# Patient Record
Sex: Female | Born: 1965 | Race: Black or African American | Hispanic: No | Marital: Single | Smoking: Never smoker
Health system: Southern US, Community
[De-identification: ages and names within clinical notes are randomized; demographics above are authoritative.]

## PROBLEM LIST (undated history)

## (undated) DIAGNOSIS — J349 Unspecified disorder of nose and nasal sinuses: Secondary | ICD-10-CM

## (undated) DIAGNOSIS — T7840XA Allergy, unspecified, initial encounter: Secondary | ICD-10-CM

## (undated) DIAGNOSIS — G4730 Sleep apnea, unspecified: Secondary | ICD-10-CM

## (undated) DIAGNOSIS — F419 Anxiety disorder, unspecified: Secondary | ICD-10-CM

## (undated) DIAGNOSIS — R0683 Snoring: Secondary | ICD-10-CM

## (undated) DIAGNOSIS — F329 Major depressive disorder, single episode, unspecified: Secondary | ICD-10-CM

## (undated) DIAGNOSIS — E7800 Pure hypercholesterolemia, unspecified: Secondary | ICD-10-CM

## (undated) DIAGNOSIS — R011 Cardiac murmur, unspecified: Secondary | ICD-10-CM

## (undated) DIAGNOSIS — R7303 Prediabetes: Secondary | ICD-10-CM

## (undated) DIAGNOSIS — U071 COVID-19: Secondary | ICD-10-CM

## (undated) DIAGNOSIS — Z973 Presence of spectacles and contact lenses: Secondary | ICD-10-CM

## (undated) DIAGNOSIS — K219 Gastro-esophageal reflux disease without esophagitis: Secondary | ICD-10-CM

## (undated) DIAGNOSIS — F32A Depression, unspecified: Secondary | ICD-10-CM

## (undated) DIAGNOSIS — J189 Pneumonia, unspecified organism: Secondary | ICD-10-CM

## (undated) HISTORY — PX: JOINT REPLACEMENT: SHX530

## (undated) HISTORY — PX: TUBAL LIGATION: SHX77

## (undated) HISTORY — DX: Cardiac murmur, unspecified: R01.1

## (undated) HISTORY — DX: Gastro-esophageal reflux disease without esophagitis: K21.9

## (undated) HISTORY — DX: Pure hypercholesterolemia, unspecified: E78.00

## (undated) HISTORY — DX: Sleep apnea, unspecified: G47.30

## (undated) HISTORY — DX: Allergy, unspecified, initial encounter: T78.40XA

## (undated) HISTORY — PX: CERVICAL FUSION: SHX112

---

## 1991-10-29 HISTORY — PX: CERVICAL FUSION: SHX112

## 1997-12-06 ENCOUNTER — Ambulatory Visit (HOSPITAL_COMMUNITY): Admission: RE | Admit: 1997-12-06 | Discharge: 1997-12-06 | Payer: Self-pay | Admitting: Family Medicine

## 2005-12-17 ENCOUNTER — Other Ambulatory Visit: Admission: RE | Admit: 2005-12-17 | Discharge: 2005-12-17 | Payer: Self-pay | Admitting: Family Medicine

## 2007-07-03 ENCOUNTER — Ambulatory Visit (HOSPITAL_BASED_OUTPATIENT_CLINIC_OR_DEPARTMENT_OTHER): Admission: RE | Admit: 2007-07-03 | Discharge: 2007-07-03 | Payer: Self-pay | Admitting: Orthopedic Surgery

## 2007-07-03 HISTORY — PX: CARPAL TUNNEL RELEASE: SHX101

## 2007-07-31 ENCOUNTER — Ambulatory Visit (HOSPITAL_BASED_OUTPATIENT_CLINIC_OR_DEPARTMENT_OTHER): Admission: RE | Admit: 2007-07-31 | Discharge: 2007-07-31 | Payer: Self-pay | Admitting: Orthopedic Surgery

## 2007-07-31 HISTORY — PX: CARPAL TUNNEL RELEASE: SHX101

## 2007-12-30 ENCOUNTER — Encounter: Admission: RE | Admit: 2007-12-30 | Discharge: 2007-12-30 | Payer: Self-pay | Admitting: Orthopedic Surgery

## 2010-01-31 ENCOUNTER — Encounter: Admission: RE | Admit: 2010-01-31 | Discharge: 2010-01-31 | Payer: Self-pay | Admitting: Family Medicine

## 2011-03-12 NOTE — Op Note (Signed)
NAMELATRICE, STORLIE            ACCOUNT NO.:  [id number]   MEDICAL RECORD NO.:  [id number]          PATIENT TYPE:  AMB   LOCATION:  DSC                          FACILITY:  [facility]   PHYSICIAN:  Katy Fitch. Sypher, M.D. DATE OF BIRTH:  Feb 21, 1966   DATE OF PROCEDURE:  07/03/2007  DATE OF DISCHARGE:                               OPERATIVE REPORT   PREOPERATIVE DIAGNOSIS:  Chronic entrapped neuropathy, left median nerve  at carpal tunnel.   POSTOPERATIVE DIAGNOSIS:  Chronic entrapped neuropathy, left median  nerve at carpal tunnel.   OPERATIONS:  Release of left transverse carpal ligament.   OPERATIONS:  Lovey Newcomer, M.D.   ASSISTANT:  Nurse.   ANESTHESIA:  General by LMA; supervising anesthesiologist is Dr. Gypsy Balsam.   INDICATIONS:  Grace Barajas is a 45 year old woman employed by  [employer].  She has had a history of bilateral hand numbness  and discomfort.  She is referred by her primary care physician, Dr.  Christell Constant, in [place], [place] for evaluation and management of hand  numbness and discomfort.   Clinical examination suggested bilateral carpal tunnel syndrome.  Electrodiagnostic studies completed by Dr. Johna Roles revealed evidence of  moderately severe bilateral carpal tunnel syndrome.   Due to failed response to nonoperative measures, she is brought to the  operating room at this time for release of her left transverse carpal  ligament.   PROCEDURE:  Noelia Slaby was brought to the operating room and  placed in supine position on the operating table.   Following the induction of general anesthesia by LMA technique, the left  arm was prepped with Betadine soap solution and sterilely draped.  A  pneumatic tourniquet was applied proximally on the left brachium.   Following exsanguination of the left arm with Esmarch bandage, arterial  tourniquet was inflated to 220 mmHg.  Procedure commenced with a short  incision in the line of the ring finger and  palm.  Subcutaneous tissues  were carefully divided, revealing the palmar fascia.  This was split  longitudinally to reveal the __________ branches of the median nerve.  These were followed back to transverse carpal ligament which was gently  dissected from the median nerve.  The distal margin of the transverse  carpal ligament was quite tight and had extensive muscle obscuring the  anatomy of the transverse carpal ligament.  The muscle fibers were  gently teased apart to safely identify the ligament and to assure that  no motor branches were present.   The ligament was then released along its ulnar border extending into the  distal forearm.  This widely opened the carpal canal.  No masses or  other predicaments were noted.   Bleeding points along the margin of released ligament were  electrocauterized with bipolar current followed by repair of the skin  with intradermal 3-0 Prolene suture.   A compressive dressing was applied with a volar plasty splint,  maintaining this in 5 degrees of dorsiflexion.   For aftercare, Ms. Mangrum is provided a prescription of Percocet 5 mg  1 p.o. q.4-6h. p.r.n. pain 20 tablets without refill.  She will return  to see [place] in the office for follow-up in a week to consider suture  removal.  Will also initiate an exercise program at that time.  We  anticipate right carpal tunnel surgery in approximately 3 weeks.      Katy Fitch Sypher, M.D.  Electronically Signed     RVS/MEDQ  D:  07/03/2007  T:  07/03/2007  Job:  [id number]   cc:   Ernestina Penna, M.D.  Katy Fitch Sypher, M.D.

## 2011-03-12 NOTE — Op Note (Signed)
[name], [name]            ACCOUNT NO.:  [id number]   MEDICAL RECORD NO.:  [id number]          PATIENT TYPE:  AMB   LOCATION:  DSC                          FACILITY:  [facility]   PHYSICIAN:  Katy Fitch. Sypher, M.D. DATE OF BIRTH:  Dec 02, 1965   DATE OF PROCEDURE:  07/31/2007  DATE OF DISCHARGE:                               OPERATIVE REPORT   PREOPERATIVE DIAGNOSIS:  Chronic entrapment neuropathy, right median  nerve, at wrist.   POSTOPERATIVE DIAGNOSIS:  Chronic entrapment neuropathy, right median  nerve, at wrist.   OPERATION:  Release of right transverse carpal ligament.   OPERATING SURGEON:  Josephine Igo, MD   ASSISTANT:  Annye Rusk PA-C   ANESTHESIA:  General by LMA.   SUPERVISING ANESTHESIOLOGIST:  Jairo Ben, MD   INDICATIONS:  Grace Barajas is a 45 year old woman referred for  evaluation and management of bilateral carpal tunnel syndrome.  She is  status post release of her left transverse carpal ligament with a  satisfactory result.  She now presents for identical surgery on the  right.   After informed consent, she was brought to the operating room at this  time, anticipating release of her right transverse carpal ligament.   PROCEDURE:  Grace Barajas was brought to the operating room and  placed in a supine position upon the operating table.   Following the induction of general anesthesia by LMA technique, the  right arm was prepped with Betadine soaping solution and sterilely  draped.   On exsanguination of the right arm with an Esmarch bandage, the arterial  tourniquet on the proximal brachium was inflated to 220 mmHg.  The  procedure commenced with a short incision in the line of the ring finger  and the palm.  Subcutaneous tissues were carefully divided, revealing  the palmar fascia.  The palmar fascia was split longitudinally to reveal  the common sensory branch of the median nerve and the superficial palmar  arch.  The distal margin  of the transverse carpal ligament was rather  indistinct with a prominent muscle crossing between the hypothenar and  thenar muscles.   This was gently teased apart with scissors, identifying the distal  margin of the transverse carpal ligament.   The ligament was released along its distal ulnar aspect with a scalpel  and scissors and subsequently released across the wrist on its ulnar  aspect into the distal forearm.  The volar forearm fascia was likewise  released subcutaneously.   This widely opened the carpal canal.  The median nerve was fully  decompressed.  The ulnar bursa was fibrotic and opaque.   There were no mass or predicaments appreciated.   Bleeding points along the margin of the released ligament were  electrocauterized with bipolar current followed by repair of the skin  with intradermal 3-0 Prolene suture.   A compressive supply was applied with a volar plaster splint,  maintaining the wrist in 5 degrees of dorsiflexion.   For aftercare, Grace Barajas was given a prescription for Percocet 5 mg  one p.o. q.4-6 h. p.r.n. pain, 20 tablets without refill.   She will return for followup  in 1 week or sooner p.r.n. problems.      Katy Fitch Sypher, M.D.  Electronically Signed     RVS/MEDQ  D:  07/31/2007  T:  08/01/2007  Job:  [id number]

## 2011-08-08 DIAGNOSIS — F251 Schizoaffective disorder, depressive type: Secondary | ICD-10-CM | POA: Insufficient documentation

## 2011-08-08 DIAGNOSIS — F259 Schizoaffective disorder, unspecified: Secondary | ICD-10-CM | POA: Insufficient documentation

## 2011-08-08 LAB — POCT HEMOGLOBIN-HEMACUE
Hemoglobin: 11.2 — ABNORMAL LOW
Operator id: 123881

## 2011-08-09 LAB — POCT HEMOGLOBIN-HEMACUE: Operator id: 123881

## 2011-12-30 ENCOUNTER — Encounter: Payer: Self-pay | Admitting: Pulmonary Disease

## 2011-12-30 ENCOUNTER — Ambulatory Visit (INDEPENDENT_AMBULATORY_CARE_PROVIDER_SITE_OTHER): Payer: Self-pay | Admitting: Pulmonary Disease

## 2011-12-30 ENCOUNTER — Other Ambulatory Visit (INDEPENDENT_AMBULATORY_CARE_PROVIDER_SITE_OTHER): Payer: Medicare Other

## 2011-12-30 VITALS — BP 120/72 | HR 94 | Temp 98.8°F | Ht 65.5 in | Wt 204.0 lb

## 2011-12-30 DIAGNOSIS — R0609 Other forms of dyspnea: Secondary | ICD-10-CM

## 2011-12-30 DIAGNOSIS — R0600 Dyspnea, unspecified: Secondary | ICD-10-CM

## 2011-12-30 DIAGNOSIS — R0989 Other specified symptoms and signs involving the circulatory and respiratory systems: Secondary | ICD-10-CM

## 2011-12-30 LAB — BASIC METABOLIC PANEL
CO2: 27 mEq/L (ref 19–32)
Chloride: 109 mEq/L (ref 96–112)
GFR: 119.6 mL/min (ref >60.00)
Glucose, Bld: 102 mg/dL — ABNORMAL HIGH (ref 70–99)
Sodium: 142 mEq/L (ref 135–145)

## 2011-12-30 NOTE — Patient Instructions (Signed)
Your shortness of breath may be related to panic attacks We will check blood work today - if positive, you may need a scan for your lungs to rule out blood clots If negative, please discuss with your psychiatrist further No breathing meds required

## 2011-12-30 NOTE — Progress Notes (Signed)
  Subjective:    Patient ID: Grace Barajas, female    DOB: 1966/07/17, 46 y.o.   MRN: [id number]  HPI PCP - Aquilla Hacker FP  46 year old never smoker for evaluation of episodic dyspnea. She reports ongoing dyspnea unrelated to exertion, while laying down or while walking for the past one to 2 months. She reports 2 severe episodes-the first one was when she was working in her rental apartment and may have been exposed to a chemical. Seen initially- felt to be RADS due to chemical reaction- given albuterol & prednisone. Seen again on 11/18/11 - prolonged exp phase on exam Spirometry did not show any evidence of airway obstruction. FEV1 was 86%-2.1 L, ratio is 80. CXR was nml Labs reviewed were normal, except for a borderline white count of 3.9 She has been treated for depression and anxiety at [facility] for 5 years and is maintained on bupropion and perphenazine. She has gained wt to 204 lbs now She wonders if her symptoms may be related to panic attacks but is concerned about blood clots and wants to be checked out. A nephew had a DVT after an ankle fracture. She denies tremors, palpitations, chest pain, sense of impending doom during these attacks. She denies nocturnal wheezing or childhood history of asthma.   Review of Systems  Constitutional: Positive for unexpected weight change. Negative for fever.  HENT: Positive for dental problem. Negative for ear pain, nosebleeds, congestion, sore throat, rhinorrhea, sneezing, trouble swallowing, postnasal drip and sinus pressure.   Eyes: Negative for redness and itching.  Respiratory: Positive for shortness of breath. Negative for cough, chest tightness and wheezing.   Cardiovascular: Negative for palpitations and leg swelling.  Gastrointestinal: Negative for nausea and vomiting.  Genitourinary: Negative for dysuria.  Musculoskeletal: Positive for arthralgias. Negative for joint swelling.  Skin: Negative for rash.    Neurological: Negative for headaches.  Hematological: Does not bruise/bleed easily.  Psychiatric/Behavioral: Negative for dysphoric mood. The patient is nervous/anxious.        Objective:   Physical Exam  Gen. Pleasant, well-nourished, in no distress, normal affect ENT - no lesions, no post nasal drip Neck: No JVD, no thyromegaly, no carotid bruits Lungs: no use of accessory muscles, no dullness to percussion, clear without rales or rhonchi  Cardiovascular: Rhythm regular, heart sounds  normal, no murmurs or gallops, no peripheral edema Abdomen: soft and non-tender, no hepatosplenomegaly, BS normal. Musculoskeletal: No deformities, no cyanosis or clubbing Neuro:  alert, non focal        Assessment & Plan:

## 2011-12-30 NOTE — Assessment & Plan Note (Addendum)
Spirometry does not show airway obstruction Doubt late onset asthma here, ok to stop symbicort may be related to panic attacks We will check BMET, TSH & d-dimer - if positive, you may need a scan for your lungs to rule out blood clots If negative, please discuss with your psychiatrist further No breathing meds required If Further confirmation required, methacholine challenge testing can be performed in the future

## 2011-12-31 LAB — D-DIMER, QUANTITATIVE: D-Dimer, Quant: 0.22 ug/mL-FEU (ref 0.00–0.48)

## 2013-01-05 ENCOUNTER — Encounter (HOSPITAL_COMMUNITY): Payer: Self-pay | Admitting: Pharmacist

## 2013-01-11 ENCOUNTER — Other Ambulatory Visit: Payer: Self-pay | Admitting: Obstetrics and Gynecology

## 2013-01-14 ENCOUNTER — Inpatient Hospital Stay (HOSPITAL_COMMUNITY): Admission: RE | Admit: 2013-01-14 | Payer: Medicare Other | Source: Ambulatory Visit

## 2013-01-18 ENCOUNTER — Encounter (HOSPITAL_COMMUNITY)
Admission: RE | Admit: 2013-01-18 | Discharge: 2013-01-18 | Disposition: A | Discharge status: Home / Self Care | Payer: Medicare Other | Source: Ambulatory Visit | Attending: Obstetrics and Gynecology | Admitting: Obstetrics and Gynecology

## 2013-01-18 ENCOUNTER — Encounter (HOSPITAL_COMMUNITY): Payer: Self-pay

## 2013-01-18 HISTORY — DX: Major depressive disorder, single episode, unspecified: F32.9

## 2013-01-18 HISTORY — DX: Depression, unspecified: F32.A

## 2013-01-18 HISTORY — DX: Unspecified disorder of nose and nasal sinuses: J34.9

## 2013-01-18 HISTORY — DX: Anxiety disorder, unspecified: F41.9

## 2013-01-18 LAB — BASIC METABOLIC PANEL
BUN: 13 mg/dL (ref 6–23)
Calcium: 10 mg/dL (ref 8.4–10.5)
GFR calc Af Amer: >90 mL/min (ref >90)
GFR calc non Af Amer: >90 mL/min (ref >90)
Sodium: 140 mEq/L (ref 135–145)

## 2013-01-18 LAB — CBC
MCHC: 34.1 g/dL (ref 30.0–36.0)
MCV: 87.1 fL (ref 78.0–100.0)
Platelets: 418 10*3/uL — ABNORMAL HIGH (ref 150–400)
RBC: 3.94 MIL/uL (ref 3.87–5.11)
WBC: 5.7 10*3/uL (ref 4.0–10.5)

## 2013-01-18 LAB — SURGICAL PCR SCREEN: Staphylococcus aureus: NEGATIVE

## 2013-01-18 NOTE — Patient Instructions (Addendum)
[address]  01/18/2013   Your procedure is scheduled on:  01/21/13  Enter through the Main Entrance of [facility] at 1130 AM.  Pick up the phone at the desk and dial [phone].   Call this number if you have problems the morning of surgery: [phone]   Remember:   Do not eat food:After Midnight.  Do not drink clear liquids: 6 Hours before arrival.  Take these medicines the morning of surgery with A SIP OF WATER: Morning medication   Do not wear jewelry, make-up or nail polish.  Do not wear lotions, powders, or perfumes. You may wear deodorant.  Do not shave 48 hours prior to surgery.  Do not bring valuables to the hospital.  Contacts, dentures or bridgework may not be worn into surgery.  Leave suitcase in the car. After surgery it may be brought to your room.  For patients admitted to the hospital, checkout time is 11:00 AM the day of discharge.   Patients discharged the day of surgery will not be allowed to drive home.  Name and phone number of your driver: NA  Special Instructions: Shower using CHG 2 nights before surgery and the night before surgery.  If you shower the day of surgery use CHG.  Use special wash - you have one bottle of CHG for all showers.  You should use approximately 1/3 of the bottle for each shower.   Please read over the following fact sheets that you were given: MRSA Information

## 2013-01-21 ENCOUNTER — Encounter (HOSPITAL_COMMUNITY)
Admission: RE | Disposition: A | Discharge status: Home / Self Care | Payer: Self-pay | Source: Ambulatory Visit | Attending: Obstetrics and Gynecology

## 2013-01-21 ENCOUNTER — Encounter (HOSPITAL_COMMUNITY): Payer: Self-pay

## 2013-01-21 ENCOUNTER — Ambulatory Visit (HOSPITAL_COMMUNITY): Payer: Medicare Other | Admitting: Anesthesiology

## 2013-01-21 ENCOUNTER — Ambulatory Visit (HOSPITAL_COMMUNITY)
Admission: RE | Admit: 2013-01-21 | Discharge: 2013-01-22 | Disposition: A | Discharge status: Home / Self Care | Payer: Medicare Other | Source: Ambulatory Visit | Attending: Obstetrics and Gynecology | Admitting: Obstetrics and Gynecology

## 2013-01-21 ENCOUNTER — Encounter (HOSPITAL_COMMUNITY): Payer: Self-pay | Admitting: Anesthesiology

## 2013-01-21 DIAGNOSIS — Z90710 Acquired absence of both cervix and uterus: Secondary | ICD-10-CM

## 2013-01-21 DIAGNOSIS — D259 Leiomyoma of uterus, unspecified: Secondary | ICD-10-CM | POA: Insufficient documentation

## 2013-01-21 DIAGNOSIS — N920 Excessive and frequent menstruation with regular cycle: Secondary | ICD-10-CM | POA: Insufficient documentation

## 2013-01-21 HISTORY — PX: ROBOTIC ASSISTED TOTAL HYSTERECTOMY: SHX6085

## 2013-01-21 HISTORY — PX: BILATERAL SALPINGECTOMY: SHX5743

## 2013-01-21 LAB — BASIC METABOLIC PANEL
BUN: 12 mg/dL (ref 6–23)
CO2: 25 mEq/L (ref 19–32)
Calcium: 9.2 mg/dL (ref 8.4–10.5)
Chloride: 97 mEq/L (ref 96–112)
Creatinine, Ser: 0.67 mg/dL (ref 0.50–1.10)
Glucose, Bld: 162 mg/dL — ABNORMAL HIGH (ref 70–99)

## 2013-01-21 LAB — CBC
HCT: 31.9 % — ABNORMAL LOW (ref 36.0–46.0)
MCH: 29.7 pg (ref 26.0–34.0)
MCHC: 34.5 g/dL (ref 30.0–36.0)
MCV: 86.2 fL (ref 78.0–100.0)
Platelets: 352 10*3/uL (ref 150–400)
RDW: 13.3 % (ref 11.5–15.5)

## 2013-01-21 SURGERY — ROBOTIC ASSISTED TOTAL HYSTERECTOMY
Anesthesia: General | Wound class: Clean Contaminated

## 2013-01-21 MED ORDER — GLYCOPYRROLATE 0.2 MG/ML IJ SOLN
INTRAMUSCULAR | Status: AC
  Filled 2013-01-21: qty 1

## 2013-01-21 MED ORDER — FENTANYL CITRATE 0.05 MG/ML IJ SOLN
INTRAMUSCULAR | Status: AC
  Administered 2013-01-21: 50 ug via INTRAVENOUS
  Filled 2013-01-21: qty 2

## 2013-01-21 MED ORDER — ONDANSETRON HCL 4 MG PO TABS: 4.0000 mg | ORAL_TABLET | Freq: Four times a day (QID) | ORAL | Status: DC | PRN

## 2013-01-21 MED ORDER — NEOSTIGMINE METHYLSULFATE 1 MG/ML IJ SOLN
INTRAMUSCULAR | Status: AC
  Filled 2013-01-21: qty 1

## 2013-01-21 MED ORDER — ACETAMINOPHEN 10 MG/ML IV SOLN
1000.0000 mg | Freq: Four times a day (QID) | INTRAVENOUS | Status: AC
  Administered 2013-01-21: 1000 mg via INTRAVENOUS
  Filled 2013-01-21: qty 100

## 2013-01-21 MED ORDER — KETOROLAC TROMETHAMINE 30 MG/ML IJ SOLN: 15.0000 mg | Freq: Once | INTRAMUSCULAR | Status: AC | PRN

## 2013-01-21 MED ORDER — ONDANSETRON HCL 4 MG/2ML IJ SOLN
4.0000 mg | Freq: Four times a day (QID) | INTRAMUSCULAR | Status: DC | PRN
  Administered 2013-01-21: 4 mg via INTRAVENOUS
  Filled 2013-01-21: qty 2

## 2013-01-21 MED ORDER — PROMETHAZINE HCL 25 MG/ML IJ SOLN: 6.2500 mg | INTRAMUSCULAR | Status: DC | PRN

## 2013-01-21 MED ORDER — KETOROLAC TROMETHAMINE 30 MG/ML IJ SOLN
INTRAMUSCULAR | Status: AC
  Administered 2013-01-21: 30 mg via INTRAVENOUS
  Filled 2013-01-21: qty 1

## 2013-01-21 MED ORDER — HYDROMORPHONE HCL PF 1 MG/ML IJ SOLN
INTRAMUSCULAR | Status: DC | PRN
  Administered 2013-01-21: 1 mg via INTRAVENOUS

## 2013-01-21 MED ORDER — KETOROLAC TROMETHAMINE 30 MG/ML IJ SOLN
30.0000 mg | Freq: Four times a day (QID) | INTRAMUSCULAR | Status: DC
  Administered 2013-01-21: 30 mg via INTRAVENOUS
  Filled 2013-01-21: qty 1

## 2013-01-21 MED ORDER — ONDANSETRON HCL 4 MG/2ML IJ SOLN
INTRAMUSCULAR | Status: DC | PRN
  Administered 2013-01-21: 4 mg via INTRAVENOUS

## 2013-01-21 MED ORDER — OXYCODONE-ACETAMINOPHEN 5-325 MG PO TABS
1.0000 | ORAL_TABLET | ORAL | Status: DC | PRN
  Administered 2013-01-21: 1 via ORAL
  Filled 2013-01-21: qty 1

## 2013-01-21 MED ORDER — FENTANYL CITRATE 0.05 MG/ML IJ SOLN
25.0000 ug | INTRAMUSCULAR | Status: DC | PRN
  Administered 2013-01-21 (×2): 50 ug via INTRAVENOUS

## 2013-01-21 MED ORDER — PROPOFOL 10 MG/ML IV EMUL
INTRAVENOUS | Status: AC
  Filled 2013-01-21: qty 20

## 2013-01-21 MED ORDER — PHENYLEPHRINE HCL 10 MG/ML IJ SOLN
INTRAMUSCULAR | Status: DC | PRN
  Administered 2013-01-21 (×2): .04 mg via INTRAVENOUS

## 2013-01-21 MED ORDER — PANTOPRAZOLE SODIUM 40 MG PO TBEC
40.0000 mg | DELAYED_RELEASE_TABLET | Freq: Every day | ORAL | Status: DC
  Filled 2013-01-21 (×2): qty 1

## 2013-01-21 MED ORDER — OXYCODONE-ACETAMINOPHEN 5-325 MG PO TABS: 1.0000 | ORAL_TABLET | ORAL | Status: DC | PRN

## 2013-01-21 MED ORDER — GLYCOPYRROLATE 0.2 MG/ML IJ SOLN
INTRAMUSCULAR | Status: AC
  Filled 2013-01-21: qty 2

## 2013-01-21 MED ORDER — MEPERIDINE HCL 25 MG/ML IJ SOLN: 6.2500 mg | INTRAMUSCULAR | Status: DC | PRN

## 2013-01-21 MED ORDER — ROCURONIUM BROMIDE 50 MG/5ML IV SOLN
INTRAVENOUS | Status: AC
  Filled 2013-01-21: qty 1

## 2013-01-21 MED ORDER — PHENYLEPHRINE 40 MCG/ML (10ML) SYRINGE FOR IV PUSH (FOR BLOOD PRESSURE SUPPORT)
PREFILLED_SYRINGE | INTRAVENOUS | Status: AC
  Filled 2013-01-21: qty 5

## 2013-01-21 MED ORDER — ONDANSETRON HCL 4 MG/2ML IJ SOLN
INTRAMUSCULAR | Status: AC
Start: 2013-01-21 — End: 2013-01-21
  Filled 2013-01-21: qty 2

## 2013-01-21 MED ORDER — ROCURONIUM BROMIDE 100 MG/10ML IV SOLN
INTRAVENOUS | Status: DC | PRN
  Administered 2013-01-21 (×2): 10 mg via INTRAVENOUS
  Administered 2013-01-21: 20 mg via INTRAVENOUS
  Administered 2013-01-21: 40 mg via INTRAVENOUS
  Administered 2013-01-21: 10 mg via INTRAVENOUS
  Administered 2013-01-21: 20 mg via INTRAVENOUS

## 2013-01-21 MED ORDER — KETOROLAC TROMETHAMINE 30 MG/ML IJ SOLN
INTRAMUSCULAR | Status: DC | PRN
  Administered 2013-01-21: 30 mg via INTRAVENOUS

## 2013-01-21 MED ORDER — HYDROMORPHONE HCL PF 1 MG/ML IJ SOLN: 0.2500 mg | INTRAMUSCULAR | Status: DC | PRN

## 2013-01-21 MED ORDER — FENTANYL CITRATE 0.05 MG/ML IJ SOLN
INTRAMUSCULAR | Status: DC | PRN
  Administered 2013-01-21: 100 ug via INTRAVENOUS
  Administered 2013-01-21: 150 ug via INTRAVENOUS

## 2013-01-21 MED ORDER — LIDOCAINE HCL (CARDIAC) 20 MG/ML IV SOLN
INTRAVENOUS | Status: DC | PRN
  Administered 2013-01-21: 70 mg via INTRAVENOUS

## 2013-01-21 MED ORDER — HYDROMORPHONE HCL PF 1 MG/ML IJ SOLN
INTRAMUSCULAR | Status: AC
  Filled 2013-01-21: qty 1

## 2013-01-21 MED ORDER — DEXAMETHASONE SODIUM PHOSPHATE 10 MG/ML IJ SOLN
INTRAMUSCULAR | Status: AC
  Filled 2013-01-21: qty 1

## 2013-01-21 MED ORDER — LIDOCAINE HCL (CARDIAC) 20 MG/ML IV SOLN
INTRAVENOUS | Status: AC
  Filled 2013-01-21: qty 5

## 2013-01-21 MED ORDER — FLUOXETINE HCL 10 MG PO CAPS
10.0000 mg | ORAL_CAPSULE | Freq: Two times a day (BID) | ORAL | Status: DC
  Filled 2013-01-21 (×2): qty 1

## 2013-01-21 MED ORDER — PROPOFOL 10 MG/ML IV EMUL
INTRAVENOUS | Status: DC | PRN
  Administered 2013-01-21 (×3): 50 mg via INTRAVENOUS
  Administered 2013-01-21: 200 mg via INTRAVENOUS

## 2013-01-21 MED ORDER — PERPHENAZINE 2 MG PO TABS
2.0000 mg | ORAL_TABLET | Freq: Every day | ORAL | Status: DC
  Filled 2013-01-21: qty 1

## 2013-01-21 MED ORDER — NEOSTIGMINE METHYLSULFATE 1 MG/ML IJ SOLN
INTRAMUSCULAR | Status: DC | PRN
  Administered 2013-01-21: 4 mg via INTRAVENOUS

## 2013-01-21 MED ORDER — DEXAMETHASONE SODIUM PHOSPHATE 4 MG/ML IJ SOLN
INTRAMUSCULAR | Status: DC | PRN
  Administered 2013-01-21: 10 mg via INTRAVENOUS

## 2013-01-21 MED ORDER — MIDAZOLAM HCL 2 MG/2ML IJ SOLN: 0.5000 mg | Freq: Once | INTRAMUSCULAR | Status: DC | PRN

## 2013-01-21 MED ORDER — DEXTROSE IN LACTATED RINGERS 5 % IV SOLN
INTRAVENOUS | Status: DC
  Administered 2013-01-21: 23:00:00 via INTRAVENOUS

## 2013-01-21 MED ORDER — ZOLPIDEM TARTRATE 5 MG PO TABS: 5.0000 mg | ORAL_TABLET | Freq: Every evening | ORAL | Status: DC | PRN

## 2013-01-21 MED ORDER — MENTHOL 3 MG MT LOZG: 1.0000 | LOZENGE | OROMUCOSAL | Status: DC | PRN

## 2013-01-21 MED ORDER — MIDAZOLAM HCL 2 MG/2ML IJ SOLN
INTRAMUSCULAR | Status: AC
  Filled 2013-01-21: qty 2

## 2013-01-21 MED ORDER — FENTANYL CITRATE 0.05 MG/ML IJ SOLN
INTRAMUSCULAR | Status: AC
  Filled 2013-01-21: qty 5

## 2013-01-21 MED ORDER — IBUPROFEN 800 MG PO TABS: 800.0000 mg | ORAL_TABLET | Freq: Three times a day (TID) | ORAL | Status: DC | PRN

## 2013-01-21 MED ORDER — BUSPIRONE HCL 15 MG PO TABS
15.0000 mg | ORAL_TABLET | Freq: Two times a day (BID) | ORAL | Status: DC
  Filled 2013-01-21 (×2): qty 1

## 2013-01-21 MED ORDER — KETOROLAC TROMETHAMINE 30 MG/ML IJ SOLN: 30.0000 mg | Freq: Four times a day (QID) | INTRAMUSCULAR | Status: DC

## 2013-01-21 MED ORDER — MIDAZOLAM HCL 5 MG/5ML IJ SOLN
INTRAMUSCULAR | Status: DC | PRN
  Administered 2013-01-21: 2 mg via INTRAVENOUS

## 2013-01-21 MED ORDER — LACTATED RINGERS IV SOLN
INTRAVENOUS | Status: DC
  Administered 2013-01-21 (×3): via INTRAVENOUS

## 2013-01-21 MED ORDER — BUPIVACAINE HCL (PF) 0.25 % IJ SOLN
INTRAMUSCULAR | Status: AC
  Filled 2013-01-21: qty 30

## 2013-01-21 MED ORDER — CEFAZOLIN SODIUM-DEXTROSE 2-3 GM-% IV SOLR
INTRAVENOUS | Status: AC
  Filled 2013-01-21: qty 50

## 2013-01-21 MED ORDER — ACETAMINOPHEN 10 MG/ML IV SOLN
INTRAVENOUS | Status: AC
  Administered 2013-01-21: 1000 mg via INTRAVENOUS
  Filled 2013-01-21: qty 100

## 2013-01-21 MED ORDER — STERILE WATER FOR IRRIGATION IR SOLN
Status: DC | PRN
  Administered 2013-01-21: 1000 mL via INTRAVESICAL

## 2013-01-21 MED ORDER — GLYCOPYRROLATE 0.2 MG/ML IJ SOLN
INTRAMUSCULAR | Status: DC | PRN
  Administered 2013-01-21: .6 mg via INTRAVENOUS

## 2013-01-21 MED ORDER — CEFAZOLIN SODIUM-DEXTROSE 2-3 GM-% IV SOLR
2.0000 g | INTRAVENOUS | Status: AC
  Administered 2013-01-21: 2 g via INTRAVENOUS

## 2013-01-21 MED ORDER — LACTATED RINGERS IR SOLN
Status: DC | PRN
  Administered 2013-01-21: 3000 mL

## 2013-01-21 MED ORDER — HYDROMORPHONE HCL PF 1 MG/ML IJ SOLN
INTRAMUSCULAR | Status: AC
  Administered 2013-01-21: 0.5 mg via INTRAVENOUS
  Filled 2013-01-21: qty 1

## 2013-01-21 MED ORDER — KETOROLAC TROMETHAMINE 30 MG/ML IJ SOLN
INTRAMUSCULAR | Status: AC
  Filled 2013-01-21: qty 1

## 2013-01-21 SURGICAL SUPPLY — 65 items
BAG URINE DRAINAGE (UROLOGICAL SUPPLIES) ×3 IMPLANT
BARRIER ADHS 3X4 INTERCEED (GAUZE/BANDAGES/DRESSINGS) ×3 IMPLANT
CATH FOLEY 3WAY  5CC 16FR (CATHETERS) ×1
CATH FOLEY 3WAY 5CC 16FR (CATHETERS) ×2 IMPLANT
CHLORAPREP W/TINT 26ML (MISCELLANEOUS) ×3 IMPLANT
CLOTH BEACON ORANGE TIMEOUT ST (SAFETY) ×3 IMPLANT
CONT PATH 16OZ SNAP LID 3702 (MISCELLANEOUS) ×3 IMPLANT
COVER MAYO STAND STRL (DRAPES) ×3 IMPLANT
COVER TABLE BACK 60X90 (DRAPES) ×6 IMPLANT
COVER TIP SHEARS 8 DVNC (MISCELLANEOUS) ×2 IMPLANT
COVER TIP SHEARS 8MM DA VINCI (MISCELLANEOUS) ×1
DECANTER SPIKE VIAL GLASS SM (MISCELLANEOUS) ×3 IMPLANT
DERMABOND ADVANCED (GAUZE/BANDAGES/DRESSINGS) ×1
DERMABOND ADVANCED .7 DNX12 (GAUZE/BANDAGES/DRESSINGS) ×2 IMPLANT
DEVICE TROCAR PUNCTURE CLOSURE (ENDOMECHANICALS) IMPLANT
DRAPE HUG U DISPOSABLE (DRAPE) ×3 IMPLANT
DRAPE LG THREE QUARTER DISP (DRAPES) ×6 IMPLANT
DRAPE WARM FLUID 44X44 (DRAPE) ×3 IMPLANT
ELECT REM PT RETURN 9FT ADLT (ELECTROSURGICAL) ×3
ELECTRODE REM PT RTRN 9FT ADLT (ELECTROSURGICAL) ×2 IMPLANT
EVACUATOR SMOKE 8.L (FILTER) ×3 IMPLANT
GAUZE VASELINE 3X9 (GAUZE/BANDAGES/DRESSINGS) IMPLANT
GLOVE BIO SURGEON STRL SZ 6.5 (GLOVE) ×3 IMPLANT
GLOVE BIOGEL PI IND STRL 7.0 (GLOVE) ×4 IMPLANT
GLOVE BIOGEL PI INDICATOR 7.0 (GLOVE) ×2
GOWN STRL REIN XL XLG (GOWN DISPOSABLE) ×18 IMPLANT
KIT ACCESSORY DA VINCI DISP (KITS) ×1
KIT ACCESSORY DVNC DISP (KITS) ×2 IMPLANT
LEGGING LITHOTOMY PAIR STRL (DRAPES) ×3 IMPLANT
NEEDLE INSUFFLATION 120MM (ENDOMECHANICALS) ×3 IMPLANT
OCCLUDER COLPOPNEUMO (BALLOONS) IMPLANT
PACK LAVH (CUSTOM PROCEDURE TRAY) ×3 IMPLANT
PAD PREP 24X48 CUFFED NSTRL (MISCELLANEOUS) ×6 IMPLANT
PLUG CATH AND CAP STER (CATHETERS) ×3 IMPLANT
PROTECTOR NERVE ULNAR (MISCELLANEOUS) ×6 IMPLANT
SCISSORS LAP 5X35 DISP (ENDOMECHANICALS) IMPLANT
SET CYSTO W/LG BORE CLAMP LF (SET/KITS/TRAYS/PACK) ×3 IMPLANT
SET IRRIG TUBING LAPAROSCOPIC (IRRIGATION / IRRIGATOR) ×3 IMPLANT
SOLUTION ELECTROLUBE (MISCELLANEOUS) ×3 IMPLANT
SUT VIC AB 0 CT1 27 (SUTURE) ×6
SUT VIC AB 0 CT1 27XBRD ANTBC (SUTURE) ×12 IMPLANT
SUT VIC AB 1 CT1 27 (SUTURE) ×1
SUT VIC AB 1 CT1 27XBRD ANTBC (SUTURE) ×2 IMPLANT
SUT VICRYL 0 UR6 27IN ABS (SUTURE) ×3 IMPLANT
SUT VICRYL 4-0 PS2 18IN ABS (SUTURE) ×6 IMPLANT
SYR 50ML LL SCALE MARK (SYRINGE) ×3 IMPLANT
SYRINGE 10CC LL (SYRINGE) ×3 IMPLANT
SYSTEM CONVERTIBLE TROCAR (TROCAR) IMPLANT
TIP RUMI ORANGE 6.7MMX12CM (TIP) IMPLANT
TIP UTERINE 5.1X6CM LAV DISP (MISCELLANEOUS) ×3 IMPLANT
TIP UTERINE 6.7X10CM GRN DISP (MISCELLANEOUS) IMPLANT
TIP UTERINE 6.7X6CM WHT DISP (MISCELLANEOUS) IMPLANT
TIP UTERINE 6.7X8CM BLUE DISP (MISCELLANEOUS) ×3 IMPLANT
TOWEL OR 17X24 6PK STRL BLUE (TOWEL DISPOSABLE) ×9 IMPLANT
TROCAR 12M 150ML BLUNT (TROCAR) IMPLANT
TROCAR DILATING TIP 12MM 150MM (ENDOMECHANICALS) ×3 IMPLANT
TROCAR DISP BLADELESS 8 DVNC (TROCAR) IMPLANT
TROCAR DISP BLADELESS 8MM (TROCAR)
TROCAR XCEL 12X100 BLDLESS (ENDOMECHANICALS) IMPLANT
TROCAR XCEL NON-BLD 5MMX100MML (ENDOMECHANICALS) ×3 IMPLANT
TROCAR Z-THREAD 12X150 (TROCAR) ×3 IMPLANT
TUBING FILTER THERMOFLATOR (ELECTROSURGICAL) ×3 IMPLANT
WARMER LAPAROSCOPE (MISCELLANEOUS) ×3 IMPLANT
WATER STERILE IRR 1000ML POUR (IV SOLUTION) ×9 IMPLANT
WATER STERILE IRR 1000ML UROMA (IV SOLUTION) ×3 IMPLANT

## 2013-01-21 NOTE — Brief Op Note (Signed)
01/21/2013  5:28 PM  PATIENT:  Grace Barajas  47 y.o. female  PRE-OPERATIVE DIAGNOSIS:  Menorrhagia; Uterine Fibroid  Previous Cesarean section  POST-OPERATIVE DIAGNOSIS:  Polymenorrhea; Uterine Fibroid, Cesarean section  PROCEDURE:  DaVinci robotic total hysterectomy, bilateral salpingectomy  SURGEON:  Surgeon(s) and Role:    * Serita Kyle, MD - Primary    PHYSICIAN ASSISTANT:   ASSISTANTS: Shea Evans, M.D.   ANESTHESIA:   general  FINDINGS: NL APPENDIX, NL TUBES, OVARIES W/ BILATERAL EXTERNAL CYST, FIBROID UTERUS, ADHESION OF BLADDER PERITONEUM TO ANT ABDOMINAL WALL  EBL:  Total I/O In: 2000 [I.V.:2000] Out: 475 [Urine:400; Blood:75]  BLOOD ADMINISTERED:none  DRAINS: none   LOCAL MEDICATIONS USED:  MARCAINE     SPECIMEN:  Source of Specimen:  uterus w/ cervix, tubes  DISPOSITION OF SPECIMEN:  PATHOLOGY  COUNTS:  YES  TOURNIQUET:  * No tourniquets in log *  DICTATION: .Other Dictation: Dictation Number [id number]  PLAN OF CARE: Admit for overnight observation  PATIENT DISPOSITION:  PACU - hemodynamically stable.   Delay start of Pharmacological VTE agent (>24hrs) due to surgical blood loss or risk of bleeding: no

## 2013-01-21 NOTE — Transfer of Care (Signed)
Immediate Anesthesia Transfer of Care Note  Patient: Grace Barajas  Procedure(s) Performed: Procedure(s): ROBOTIC ASSISTED TOTAL HYSTERECTOMY (N/A) BILATERAL SALPINGECTOMY (Bilateral)  Patient Location: PACU  Anesthesia Type:General  Level of Consciousness: awake, alert  and oriented  Airway & Oxygen Therapy: Patient Spontanous Breathing and Patient connected to nasal cannula oxygen  Post-op Assessment: Report given to PACU RN and Post -op Vital signs reviewed and stable  Post vital signs: stable  Complications: No apparent anesthesia complications

## 2013-01-21 NOTE — Anesthesia Postprocedure Evaluation (Signed)
  Anesthesia Post Note  Patient: Grace Barajas  Procedure(s) Performed: Procedure(s) (LRB): ROBOTIC ASSISTED TOTAL HYSTERECTOMY (N/A) BILATERAL SALPINGECTOMY (Bilateral)  Anesthesia type: GA  Patient location: PACU  Post pain: Pain level controlled  Post assessment: Post-op Vital signs reviewed  Last Vitals:  Filed Vitals:   01/21/13 1715  BP: 132/70  Pulse: 104  Temp:   Resp: 20    Post vital signs: Reviewed  Level of consciousness: sedated  Complications: No apparent anesthesia complications

## 2013-01-21 NOTE — Anesthesia Preprocedure Evaluation (Addendum)
Anesthesia Evaluation  Patient identified by MRN, date of birth, ID band Patient awake    Reviewed: Allergy & Precautions, H&P , Patient's Chart, lab work & pertinent test results, reviewed documented beta blocker date and time   History of Anesthesia Complications Negative for: history of anesthetic complications  Airway Mallampati: II TM Distance: >3 FB Neck ROM: full    Dental no notable dental hx.    Pulmonary neg pulmonary ROS, asthma ,  breath sounds clear to auscultation  Pulmonary exam normal       Cardiovascular Exercise Tolerance: Good negative cardio ROS  Rhythm:regular Rate:Normal     Neuro/Psych PSYCHIATRIC DISORDERS Anxiety Depression negative neurological ROS  negative psych ROS   GI/Hepatic negative GI ROS, Neg liver ROS,   Endo/Other  negative endocrine ROS  Renal/GU negative Renal ROS     Musculoskeletal   Abdominal   Peds  Hematology negative hematology ROS (+)   Anesthesia Other Findings Sinus disorder     Anxiety        Depression     Asthma  S/p cervical fusion   Reproductive/Obstetrics negative OB ROS                          Anesthesia Physical Anesthesia Plan  ASA: II  Anesthesia Plan: General ETT   Post-op Pain Management:    Induction:   Airway Management Planned: Video Laryngoscope Planned  Additional Equipment:   Intra-op Plan:   Post-operative Plan:   Informed Consent: I have reviewed the patients History and Physical, chart, labs and discussed the procedure including the risks, benefits and alternatives for the proposed anesthesia with the patient or authorized representative who has indicated his/her understanding and acceptance.   Dental Advisory Given  Plan Discussed with: CRNA and Surgeon  Anesthesia Plan Comments:        Anesthesia Quick Evaluation

## 2013-01-22 ENCOUNTER — Encounter (HOSPITAL_COMMUNITY): Payer: Self-pay | Admitting: Obstetrics and Gynecology

## 2013-01-22 NOTE — Progress Notes (Signed)
Patient discharged home with significant other/friend. Voided total 700cc clear urine.  Pain under control.  C/o of nausea but no emesis at this time.  Discharge instructions reviewed and discussed with patient and verbalized understanding of instructions.  Lap sites x 5 CDI, no vaginal drainage at this time.  IV  Removed. To car via wheel chair.

## 2013-01-22 NOTE — Op Note (Signed)
NAMECHERYLE, Grace Barajas            ACCOUNT NO.:  [id number]  MEDICAL RECORD NO.:  [id number]  LOCATION:  9304                          FACILITY:  [facility]  PHYSICIAN:  Maxie Better, M.D.DATE OF BIRTH:  1965-11-25  DATE OF PROCEDURE:  01/21/2013 DATE OF DISCHARGE:  01/22/2013                              OPERATIVE REPORT   PREOPERATIVE DIAGNOSES:  Menorrhagia, uterine fibroids, previous cesarean section.  POSTOPERATIVE DIAGNOSES:  Menorrhagia, uterine fibroids, previous cesarean section.  PROCEDURES:  Da Vinci robotic total hysterectomy, bilateral salpingectomy.  ANESTHESIA:  General.  SURGEON:  Maxie Better, MD  ASSISTANT:  Darryl Nestle, MD  PROCEDURE:  Under adequate general anesthesia, the patient was placed in the dorsal lithotomy position.  She was sterilely prepped and draped in usual fashion.  An indwelling Foley catheter 3 way was sterilely placed. The patient was positioned for robotic surgery.  A weighted speculum was placed in the vagina.  Sims retractor was used anteriorly.  The cervix which was parous was grasped with 0 Vicryl attached needle holder and figure-of-eight 0 Vicryl suture was placed anteriorly and 1 posteriorly. The uterus sounded to 7 cm.  A small RUMI cup was placed and a #6 uterine manipulator was inserted, however, the balloon on insufflation burst and the #6 uterine manipulator was removed and replaced by #8 mm uterine manipulator with good position.  At that point, the weighted speculum and retractor was removed.  Attention was turned to the abdomen.  Marcaine 0.25% was injected supraumbilically.  Supraumbilical vertical incision was then made.  Veress needle was tested.  Subsequent 3 L of CO2 was insufflated.  Veress needle was then removed.  A 12-mm disposable trocar with sleeve was introduced into the abdomen without incident.  The robotic camera port was inserted.  Entry into the abdomen was without incident.  The patient was  subsequently placed in deep Trendelenburg.  Upper abdomen was notable for normal liver edge. Appendix was not seen initially.  There was serosal adhesion of the bladder to the lower anterior abdominal wall.  The right ureter was seen peristalsing.  The right ovary had a distal cyst.  The right tube had multiple paratubal cyst but otherwise normal.  The left ovary was elongated but normal.  The left tube was normal as well.  Two 8 mm robotic ports were then placed to the left of the camera site, 10 cm apart on the right and 8 mm port was placed followed with 5-mm assistant port.  Once these were placed under direct visualization, the robot was docked to the patient's left side.  In arm 1, monopolar scissors was placed.  Arm #2, the PK dissector, arm #3 the Prograsp.  Once this was done, I then went to the surgical console.  At the surgical console, the uterus was manipulated further.  Anteriorly the adhesions extended down with the bladder not being able to be initially seen. At that point, the procedure was started with lysing of the adhesions of the bladder and the serosa of the uterus off the anterior abdominal wall.  When the dissection reached the junction where it looks like potential of the bladder, retrograde filling of the bladder with 200 mL solution with methylene blue  was done and continued dissection of the bladder off of the lower uterine segment and over the core ring was done effectively. Once this was done, attention was then turned to the left fallopian tube, the underlying mesosalpinx was serially clamped, cauterized, and cut.  The left retroperitoneal space was then opened.  The ureter was noted to be peristalsing deep in the pelvis.  Continued dissection was then performed proximally.  Utero-ovarian ligaments were then clamped, cauterized, and cut.  The round ligament was also clamped, cauterized, and cut on the left and extended anteriorly.  Once this was done,  the uterine vessels were noted to be torturous, some slight bleeding was noted from them.  Nonetheless, cauterization was subsequently done in that area of the uterine vessels and above the core ring.  The uterine vessels were then severed.  The uterus was then positioned to the contralateral side.  There was a large subserosal fibroid noted posteriorly.  On the right, the mesosalpinx was clamped, cauterized, and cut.  The retroperitoneal space was opened and the ureter was noted to be peristalsing.  The right utero-ovarian ligament was clamped, cauterized, and then cut.  The round ligament was opened and cauterized and cut, and the anterior posterior leaf of the broad ligament was opened and extended towards the bladder peritoneum.  The uterine vessels were again skeletonized.  They were torturous.  They were serially clamped, cauterized, and then cut.  Once this was done, the uterosacral ligaments noted to be was little bit short in the back.  The cervicovaginal junction was then opened anteriorly at that junction and carried around circumferentially with the uterus severed from its vaginal attachment.  The uterus, however, was too large to bring through the vagina. Using the hot scissors, the uterus was bivalved and then subsequently both pieces were delivered through the vagina.  At that point, the vaginal cuff was then inspected.  The insufflator had been reinserted.  The bleeders were cauterized.  The monopolar and the PK dissectors were then replaced by long-tipped forceps and large mega suture cutting driver was inserted.  The Prograsp was replaced by the PK dissector.  Bleeding along the vaginal cuff was cauterized and 0 Vicryl figure-of-eight sutures was placed through the umbilical port site and figure-of-eights sutures were placed along the vaginal cuff. Digital exam of the vagina intraoperatively showed good approximation.  The arm #1 had become nonfunctioning  and arm #3 was  repositioned to continue with the procedure. The abdomen was then copiously irrigated and suctioned of debris.  The robotic instruments were then removed.  The robot was undocked and the needles that were used were removed, 6 in total.  Once this was done, the upper abdomen was inspected.  The fluid collection up in that area was suctioned.  Once this was achieved, the vaginal cuff was well- approximated.  The abdomen was deflated.  Robotic ports sites were then removed entirely.  The supraumbilical site fascia was identified and closed with 0 Vicryl figure-of-eight suture.  The skin incisions throughout was then approximated with 4-0 Vicryl subcuticular sutures. The occluder from the vagina had been removed.  The patient was taken out of Trendelenburg position.  The procedure was completed.  SPECIMEN:  Uterus with cervix and fallopian tubes sent to Pathology. The specimen weighed 315 g.  INTRAOPERATIVE FLUIDS:  2 L.  URINE OUTPUT:  400 mL.  ESTIMATED BLOOD LOSS:  75 mL.  COMPLICATION:  None.  The patient tolerated the procedure well, was transferred to the  recovery room in stable condition.     Maxie Better, M.D.     [place]/MEDQ  D:  01/21/2013  T:  01/22/2013  Job:  [id number]

## 2013-01-28 NOTE — Discharge Summary (Signed)
Physician Discharge Summary  Patient ID: Grace Barajas MRN: [id number] DOB/AGE: [birth year] 47 y.o.  Admit date: 01/21/2013 Discharge date: 01/28/2013  Admission Diagnoses: menorrhagia, uterine fibroids, previous cesarean section  Discharge Diagnoses: menorrhagia, uterine fibroids, previous cesarean section   Active Problems:   * No active hospital problems. *   Discharged Condition: stable  Hospital Course: Pt was admitted to [facility]. She underwent daVinci robotic total hysterectomy, bilateral salpingectomy. Postop course  unremarkable  Consults: None  Significant Diagnostic Studies:none:   Treatments: surgery: DaVinci robotic total hysterectomy, bilateral salpingectomy  Discharge Exam: Blood pressure 119/67, pulse 109, temperature 97.6 F (36.4 C), temperature source Oral, resp. rate 18, height 5\' 5"  (1.651 m), weight 87.091 kg (192 lb), SpO2 100.00%. General appearance: alert, cooperative and no distress Resp: clear to auscultation bilaterally Cardio: regular rate and rhythm, S1, S2 normal, no murmur, click, rub or gallop GI: soft nondistended incisions well approximated Pelvic: deferred  Disposition: 01-Home or Self Care  Discharge Orders   Future Orders Complete By Expires     Diet general  As directed     Discharge instructions  As directed     Comments:      Call if temperature greater than equal to 100.4, nothing per vagina for 4-6 weeks or severe nausea vomiting, increased incisional pain , drainage or redness in the incision site, no straining with bowel movements, showers no bath    May walk up steps  As directed         Medication List    STOP taking these medications       phentermine 37.5 MG capsule      TAKE these medications       busPIRone 15 MG tablet  Commonly known as:  BUSPAR  Take 15 mg by mouth 2 (two) times daily.     ferrous sulfate 325 (65 FE) MG tablet  Take 325 mg by mouth daily with breakfast.     FLUoxetine 10 MG capsule   Commonly known as:  PROZAC  Take 10 mg by mouth 2 (two) times daily.     ibuprofen 800 MG tablet  Commonly known as:  ADVIL,MOTRIN  Take 1 tablet (800 mg total) by mouth every 8 (eight) hours as needed for pain.     oxyCODONE-acetaminophen 5-325 MG per tablet  Commonly known as:  ROXICET  Take 1 tablet by mouth every 4 (four) hours as needed for pain.     perphenazine 2 MG tablet  Commonly known as:  TRILAFON  Take 1 tablet by mouth At bedtime.           Follow-up Information   Follow up with Chace Bisch A, MD In 2 weeks.   Contact information:   [address] [place] [address] [phone]       Signed: Jaydi Bray A 01/28/2013, 6:06 AM

## 2013-07-29 ENCOUNTER — Telehealth: Payer: Self-pay | Admitting: *Deleted

## 2013-07-29 NOTE — Telephone Encounter (Signed)
Pt having wrist pain after picking up furniture Now its moved into elbow and shoulder Will call back if wants to be seen

## 2013-11-15 ENCOUNTER — Other Ambulatory Visit: Payer: Self-pay | Admitting: Orthopedic Surgery

## 2013-11-16 NOTE — Discharge Instructions (Signed)

## 2013-11-18 ENCOUNTER — Ambulatory Visit (HOSPITAL_BASED_OUTPATIENT_CLINIC_OR_DEPARTMENT_OTHER): Admit: 2013-11-18 | Payer: Self-pay | Admitting: Orthopedic Surgery

## 2013-11-18 ENCOUNTER — Encounter (HOSPITAL_BASED_OUTPATIENT_CLINIC_OR_DEPARTMENT_OTHER): Payer: Self-pay

## 2013-11-18 SURGERY — RELEASE, FIRST DORSAL COMPARTMENT, HAND
Anesthesia: Monitor Anesthesia Care | Laterality: Bilateral

## 2014-01-05 ENCOUNTER — Other Ambulatory Visit: Payer: Self-pay | Admitting: Orthopedic Surgery

## 2014-01-07 ENCOUNTER — Encounter (HOSPITAL_BASED_OUTPATIENT_CLINIC_OR_DEPARTMENT_OTHER): Payer: Self-pay | Admitting: *Deleted

## 2014-01-10 ENCOUNTER — Encounter (HOSPITAL_BASED_OUTPATIENT_CLINIC_OR_DEPARTMENT_OTHER): Payer: Self-pay | Admitting: *Deleted

## 2014-01-10 NOTE — Progress Notes (Signed)
No labs do-went to [facility] to ck heart-stress test done-no meds added-to have a sleep study-denies apnea

## 2014-01-12 NOTE — H&P (Signed)
  Grace Barajas is an 48 y.o. female.   Chief Complaint: c/o bilateral 1st dorsal compartment STS symptoms HPI:   Grace Barajas is a well known former patient who presents for evaluation of bilateral wrist pain. Grace Barajas is now 64-years of age. She is on leave of absence from [place]. She is 5'5", 202 lbs. She has noted the spontaneous onset of bilateral wrist pain that has been rather disabling. The pain is radial over the first dorsal compartments. She has had a recent work up by the cardiologist at [facility]. She has had a stress test and echocardiogram both of which revealed normal findings.  Past Medical History  Diagnosis Date  . Sinus disorder   . Anxiety   . Depression   . Asthma   . Wears glasses   . Snores     Past Surgical History  Procedure Laterality Date  . Tubal ligation    . Cervical fusion      C1/C2  . Robotic assisted total hysterectomy N/A 01/21/2013    Procedure: ROBOTIC ASSISTED TOTAL HYSTERECTOMY;  Surgeon: Marvene Staff, MD;  Location: [facility];  Service: Gynecology;  Laterality: N/A;  . Bilateral salpingectomy Bilateral 01/21/2013    Procedure: BILATERAL SALPINGECTOMY;  Surgeon: Marvene Staff, MD;  Location: [facility];  Service: Gynecology;  Laterality: Bilateral;  . Carpal tunnel release Left 07/03/2007  . Carpal tunnel release Right 07/31/2007    History reviewed. No pertinent family history. Social History:  reports that she has never smoked. She has never used smokeless tobacco. She reports that she drinks alcohol. She reports that she does not use illicit drugs.  Allergies:  Allergies  Allergen Reactions  . Morphine And Related     "Felt like an addict needing a fix."    No prescriptions prior to admission    No results found for this or any previous visit (from the past 48 hour(s)).  No results found.   Pertinent items are noted in HPI.  Height 5' 5.5" (1.664 m), weight 95.255 kg (210 lb), last menstrual  period 01/11/2013.  General appearance: alert Head: Normocephalic, without obvious abnormality Neck: supple, symmetrical, trachea midline Resp: clear to auscultation bilaterally Cardio: regular rate and rhythm GI: normal findings: bowel sounds normal Extremities:  Inspection of her hands and wrists reveals no deformity. She is tender on palpation over the first dorsal compartments bilaterally. She has a very painful Finkelstein's test bilaterally.   Pulses: 2+ and symmetric Skin: normal Neurologic: Grossly normal    Assessment/Plan Impression: Bilateral 1st dorsal compartment STS  Plan: Release bilateral 1st dorsal compartments.The procedure, risks,benefits and post-op course were discussed with the patient at length and they were in agreement with the plan.  DASNOIT,Javarri Segal J 01/12/2014, 1:01 PM   H&P documentation: 01/13/2014  -History and Physical Reviewed  -Patient has been re-examined  -No change in the plan of care  Cammie Sickle, MD

## 2014-01-13 ENCOUNTER — Encounter (HOSPITAL_BASED_OUTPATIENT_CLINIC_OR_DEPARTMENT_OTHER): Payer: Self-pay | Admitting: Anesthesiology

## 2014-01-13 ENCOUNTER — Ambulatory Visit (HOSPITAL_BASED_OUTPATIENT_CLINIC_OR_DEPARTMENT_OTHER)
Admission: RE | Admit: 2014-01-13 | Discharge: 2014-01-13 | Disposition: A | Discharge status: Home / Self Care | Payer: Medicare Other | Source: Ambulatory Visit | Attending: Orthopedic Surgery | Admitting: Orthopedic Surgery

## 2014-01-13 ENCOUNTER — Ambulatory Visit (HOSPITAL_BASED_OUTPATIENT_CLINIC_OR_DEPARTMENT_OTHER): Payer: Medicare Other | Admitting: Anesthesiology

## 2014-01-13 ENCOUNTER — Encounter (HOSPITAL_BASED_OUTPATIENT_CLINIC_OR_DEPARTMENT_OTHER): Payer: Medicare Other | Admitting: Anesthesiology

## 2014-01-13 ENCOUNTER — Encounter (HOSPITAL_BASED_OUTPATIENT_CLINIC_OR_DEPARTMENT_OTHER)
Admission: RE | Disposition: A | Discharge status: Home / Self Care | Payer: Self-pay | Source: Ambulatory Visit | Attending: Orthopedic Surgery

## 2014-01-13 DIAGNOSIS — J45909 Unspecified asthma, uncomplicated: Secondary | ICD-10-CM | POA: Insufficient documentation

## 2014-01-13 DIAGNOSIS — F411 Generalized anxiety disorder: Secondary | ICD-10-CM | POA: Insufficient documentation

## 2014-01-13 DIAGNOSIS — F3289 Other specified depressive episodes: Secondary | ICD-10-CM | POA: Insufficient documentation

## 2014-01-13 DIAGNOSIS — F329 Major depressive disorder, single episode, unspecified: Secondary | ICD-10-CM | POA: Insufficient documentation

## 2014-01-13 DIAGNOSIS — M654 Radial styloid tenosynovitis [de Quervain]: Secondary | ICD-10-CM | POA: Insufficient documentation

## 2014-01-13 HISTORY — DX: Presence of spectacles and contact lenses: Z97.3

## 2014-01-13 HISTORY — PX: DORSAL COMPARTMENT RELEASE: SHX5039

## 2014-01-13 HISTORY — DX: Snoring: R06.83

## 2014-01-13 SURGERY — RELEASE, FIRST DORSAL COMPARTMENT, HAND
Anesthesia: Monitor Anesthesia Care | Site: Hand | Laterality: Bilateral

## 2014-01-13 MED ORDER — FENTANYL CITRATE 0.05 MG/ML IJ SOLN
INTRAMUSCULAR | Status: DC | PRN
  Administered 2014-01-13 (×3): 25 ug via INTRAVENOUS
  Administered 2014-01-13: 100 ug via INTRAVENOUS

## 2014-01-13 MED ORDER — CEFAZOLIN SODIUM-DEXTROSE 2-3 GM-% IV SOLR
2.0000 g | INTRAVENOUS | Status: AC
  Administered 2014-01-13: 2 g via INTRAVENOUS

## 2014-01-13 MED ORDER — DIPHENHYDRAMINE HCL 50 MG/ML IJ SOLN
INTRAMUSCULAR | Status: DC | PRN
  Administered 2014-01-13: 12.5 mg via INTRAVENOUS

## 2014-01-13 MED ORDER — CHLORHEXIDINE GLUCONATE 4 % EX LIQD
60.0000 mL | Freq: Once | CUTANEOUS | Status: DC
Start: 2014-01-13 — End: 2014-01-13

## 2014-01-13 MED ORDER — LIDOCAINE HCL 2 % IJ SOLN
INTRAMUSCULAR | Status: DC | PRN
  Administered 2014-01-13: 1 mL
  Administered 2014-01-13: 6 mL

## 2014-01-13 MED ORDER — DEXAMETHASONE SODIUM PHOSPHATE 10 MG/ML IJ SOLN
INTRAMUSCULAR | Status: DC | PRN
  Administered 2014-01-13: 10 mg via INTRAVENOUS

## 2014-01-13 MED ORDER — PROPOFOL INFUSION 10 MG/ML OPTIME
INTRAVENOUS | Status: DC | PRN
  Administered 2014-01-13: 100 ug/kg/min via INTRAVENOUS

## 2014-01-13 MED ORDER — BUPIVACAINE HCL (PF) 0.25 % IJ SOLN
INTRAMUSCULAR | Status: AC
  Filled 2014-01-13: qty 30

## 2014-01-13 MED ORDER — MIDAZOLAM HCL 2 MG/2ML IJ SOLN: 1.0000 mg | INTRAMUSCULAR | Status: DC | PRN

## 2014-01-13 MED ORDER — LACTATED RINGERS IV SOLN
INTRAVENOUS | Status: DC
  Administered 2014-01-13: 07:00:00 via INTRAVENOUS
  Administered 2014-01-13: 10 mL/h via INTRAVENOUS

## 2014-01-13 MED ORDER — FENTANYL CITRATE 0.05 MG/ML IJ SOLN
INTRAMUSCULAR | Status: AC
  Filled 2014-01-13: qty 6

## 2014-01-13 MED ORDER — METOCLOPRAMIDE HCL 5 MG/ML IJ SOLN: 10.0000 mg | Freq: Once | INTRAMUSCULAR | Status: DC | PRN

## 2014-01-13 MED ORDER — FENTANYL CITRATE 0.05 MG/ML IJ SOLN: 25.0000 ug | INTRAMUSCULAR | Status: DC | PRN

## 2014-01-13 MED ORDER — CHLORHEXIDINE GLUCONATE 4 % EX LIQD: 60.0000 mL | Freq: Once | CUTANEOUS | Status: DC

## 2014-01-13 MED ORDER — CEFAZOLIN SODIUM-DEXTROSE 2-3 GM-% IV SOLR
INTRAVENOUS | Status: AC
  Filled 2014-01-13: qty 50

## 2014-01-13 MED ORDER — MIDAZOLAM HCL 5 MG/5ML IJ SOLN
INTRAMUSCULAR | Status: DC | PRN
  Administered 2014-01-13: 2 mg via INTRAVENOUS

## 2014-01-13 MED ORDER — FENTANYL CITRATE 0.05 MG/ML IJ SOLN
50.0000 ug | INTRAMUSCULAR | Status: DC | PRN
Start: 2014-01-13 — End: 2014-01-13

## 2014-01-13 MED ORDER — LIDOCAINE HCL 2 % IJ SOLN
INTRAMUSCULAR | Status: AC
  Filled 2014-01-13: qty 40

## 2014-01-13 MED ORDER — LIDOCAINE HCL (CARDIAC) 20 MG/ML IV SOLN
INTRAVENOUS | Status: DC | PRN
  Administered 2014-01-13: 100 mg via INTRAVENOUS

## 2014-01-13 MED ORDER — HYDROCODONE-ACETAMINOPHEN 5-325 MG PO TABS: 1.0000 | ORAL_TABLET | Freq: Four times a day (QID) | ORAL | Status: DC | PRN

## 2014-01-13 MED ORDER — MIDAZOLAM HCL 2 MG/2ML IJ SOLN
INTRAMUSCULAR | Status: AC
  Filled 2014-01-13: qty 2

## 2014-01-13 SURGICAL SUPPLY — 44 items
BANDAGE ELASTIC 3 VELCRO ST LF (GAUZE/BANDAGES/DRESSINGS) ×2 IMPLANT
BLADE MINI RND TIP GREEN BEAV (BLADE) IMPLANT
BLADE SURG 15 STRL LF DISP TIS (BLADE) ×1 IMPLANT
BLADE SURG 15 STRL SS (BLADE) ×1
BNDG COHESIVE 3X5 TAN STRL LF (GAUZE/BANDAGES/DRESSINGS) ×4 IMPLANT
BNDG ESMARK 4X9 LF (GAUZE/BANDAGES/DRESSINGS) ×2 IMPLANT
BRUSH SCRUB EZ PLAIN DRY (MISCELLANEOUS) ×4 IMPLANT
CORDS BIPOLAR (ELECTRODE) ×2 IMPLANT
COVER MAYO STAND STRL (DRAPES) ×4 IMPLANT
COVER TABLE BACK 60X90 (DRAPES) ×2 IMPLANT
CUFF TOURNIQUET SINGLE 18IN (TOURNIQUET CUFF) ×4 IMPLANT
DECANTER SPIKE VIAL GLASS SM (MISCELLANEOUS) ×2 IMPLANT
DRAPE EXTREMITY T 121X128X90 (DRAPE) ×4 IMPLANT
DRAPE SURG 17X23 STRL (DRAPES) ×4 IMPLANT
DRSG TEGADERM 4X4.75 (GAUZE/BANDAGES/DRESSINGS) ×4 IMPLANT
GLOVE BIOGEL M STRL SZ7.5 (GLOVE) ×2 IMPLANT
GLOVE BIOGEL PI IND STRL 6.5 (GLOVE) ×1 IMPLANT
GLOVE BIOGEL PI IND STRL 7.0 (GLOVE) ×2 IMPLANT
GLOVE BIOGEL PI INDICATOR 6.5 (GLOVE) ×1
GLOVE BIOGEL PI INDICATOR 7.0 (GLOVE) ×2
GLOVE ECLIPSE 6.5 STRL STRAW (GLOVE) ×4 IMPLANT
GLOVE ORTHO TXT STRL SZ7.5 (GLOVE) ×2 IMPLANT
GOWN STRL REUS W/ TWL LRG LVL3 (GOWN DISPOSABLE) ×2 IMPLANT
GOWN STRL REUS W/ TWL XL LVL3 (GOWN DISPOSABLE) ×1 IMPLANT
GOWN STRL REUS W/TWL LRG LVL3 (GOWN DISPOSABLE) ×2
GOWN STRL REUS W/TWL XL LVL3 (GOWN DISPOSABLE) ×1
NEEDLE 27GAX1X1/2 (NEEDLE) ×4 IMPLANT
PACK BASIN DAY SURGERY FS (CUSTOM PROCEDURE TRAY) ×2 IMPLANT
PAD CAST 3X4 CTTN HI CHSV (CAST SUPPLIES) IMPLANT
PADDING CAST ABS 4INX4YD NS (CAST SUPPLIES) ×1
PADDING CAST ABS COTTON 4X4 ST (CAST SUPPLIES) ×1 IMPLANT
PADDING CAST COTTON 3X4 STRL (CAST SUPPLIES)
SLEEVE SCD COMPRESS KNEE MED (MISCELLANEOUS) IMPLANT
SPONGE GAUZE 4X4 12PLY (GAUZE/BANDAGES/DRESSINGS) ×4 IMPLANT
STOCKINETTE 4X48 STRL (DRAPES) ×4 IMPLANT
STRIP CLOSURE SKIN 1/2X4 (GAUZE/BANDAGES/DRESSINGS) ×2 IMPLANT
SUT PROLENE 3 0 PS 2 (SUTURE) ×2 IMPLANT
SUT PROLENE 4 0 P 3 18 (SUTURE) ×2 IMPLANT
SUT VIC AB 4-0 P-3 18XBRD (SUTURE) IMPLANT
SUT VIC AB 4-0 P3 18 (SUTURE)
SYR 3ML 23GX1 SAFETY (SYRINGE) IMPLANT
SYR CONTROL 10ML LL (SYRINGE) ×4 IMPLANT
TRAY DSU PREP LF (CUSTOM PROCEDURE TRAY) ×2 IMPLANT
UNDERPAD 30X30 INCONTINENT (UNDERPADS AND DIAPERS) ×4 IMPLANT

## 2014-01-13 NOTE — Discharge Instructions (Addendum)

## 2014-01-13 NOTE — Anesthesia Procedure Notes (Signed)
Procedure Name: MAC Date/Time: 01/13/2014 7:50 AM Performed by: Lieutenant Diego Pre-anesthesia Checklist: Patient identified, Timeout performed, Emergency Drugs available, Suction available and Patient being monitored Patient Re-evaluated:Patient Re-evaluated prior to inductionOxygen Delivery Method: Simple face mask Preoxygenation: Pre-oxygenation with 100% oxygen Intubation Type: IV induction Placement Confirmation: positive ETCO2

## 2014-01-13 NOTE — Anesthesia Postprocedure Evaluation (Signed)
Anesthesia Post Note  Patient: Grace Barajas  Procedure(s) Performed: Procedure(s) (LRB): BILATERAL 1ST DORSAL COMPARTMENT RELEASES (Bilateral)  Anesthesia type: MAC  Patient location: PACU  Post pain: Pain level controlled  Post assessment: Patient's Cardiovascular Status Stable  Last Vitals:  Filed Vitals:   01/13/14 0915  BP: 153/81  Pulse: 73  Temp:   Resp: 14    Post vital signs: Reviewed and stable  Level of consciousness: alert  Complications: No apparent anesthesia complications

## 2014-01-13 NOTE — Op Note (Signed)
NAMETORA, PRUNTY            ACCOUNT NO.:  [id number]  MEDICAL RECORD NO.:  [id number]  LOCATION:                                 FACILITY:  PHYSICIAN:  Youlanda Mighty. Leonore Frankson, M.D.      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  OPERATION: 1. Release of right first dorsal compartment with resection of a 3 mm     wide septum. 2. Release of left first dorsal compartment with excision of 3 mm wide     septum.  OPERATING SURGEON:  Youlanda Mighty. Trea Latner, M.D.  ASSISTANT:  Surgical technician.  ANESTHESIA:  2% lidocaine supplemented by IV sedation.  SUPERVISING ANESTHESIOLOGIST:  Jessy Oto. Albertina Parr, M.D.  INDICATIONS:  Grace Barajas is a 48 year old woman we have followed for a lengthy period of time for bilateral first dorsal compartment stenosing tenosynovitis.  She has not responded to nonoperative measures.  She has a very enlarged first dorsal compartment retinacula.  After detailed informed consent, she is brought to the operating room at this time anticipating release of her right and left first dorsal compartments under local anesthesia and sedation.  PROCEDURE:  Grace Barajas was brought to room 2 of the [facility] and placed in supine position on the operating table.  Following informed consent and Betadine prep, 2% lidocaine was infiltrated into the path of the intended incisions right and left and into the first dorsal compartments right and left.  This was well tolerated.  We then proceeded to perform routine Betadine scrub and paint of the right and left  upper extremities with placement of pneumatic tourniquets on the proximal right and left arms. Sterile stockinette and impervious arthroscopy drapes were applied bilaterally.  The procedure commenced on the left side.  The left hand and arm were exsanguinated with an Esmarch bandage and the arterial tourniquet inflated to 220 mmHg.  Following routine surgical time-out, a short oblique incision was fashioned directly over the palpably thickened A1 pulley.  With great care, the radial superficial sensory branches were identified and gently retracted with blunt Ragnell retractors.  A Valora Corporal was used to clear inflammatory tissue off the first dorsal compartment. The compartment was very thick, opaque, and fibrotic.  A scalp was used to incise over the apex of the compartment.  A very large caliber extensor pollicis brevis measuring more than 5 mm in width was identified.  After this was released proximally and distally from the musculotendinous junction proximally and distally to the snuffbox region, we then by palpation identified the abductor pollicis longus tendon slips or Palmer.  A second completely discrete compartment was identified.  This was split over its apex and the abductor pollicis longus tendons freed from the musculotendinous junction proximally to the snuff box distally.  The wall between the 2 tendons sets was more than 3 mm in width.  This was sequentially removed with  a rongeur.  Hemostasis was achieved with bipolar cautery.  We then ensured that glide of the tendons was recovered.  The wound was then repaired with intradermal 4-0 Prolene and a Steri-Strips.  Additional 2% lidocaine was infiltrated for postoperative comfort followed by dressing of the wound with sterile gauze and a Tegaderm dressing.  A Coban overwrap was applied for comfort.  The tourniquet was released for total tourniquet time on the left side of 16 minutes.  Attention was then directed to the  right arm.  The right arm and hand were exsanguinated with Esmarch bandage and arterial tourniquet inflated to 240 mmHg due to mild systolic hypertension noted during the procedure.  The procedure on the right was identical to that performed on the left. A short oblique incision was fashioned over the apex of the first dorsal compartment.  The radial superficial sensory branches were gently identified and retracted with blunt nail retractors.  A Valora Corporal was used to clear inflammatory tissue followed by incision over the apex of the abductor pollicis longus and extensor pollicis brevis tendons.  Again, a 3 mm wide septum was identified that was removed with a rongeur.  Bleeding points were electrocauterized with bipolar current.  We assured that tendon glide was recovered.  The wound was then repaired with intradermal 4-0 Prolene and a Steri-Strips.  A compressive dressing was applied with sterile gauze, Tegaderm and Coban.  For aftercare, Grace Barajas was provided a prescription for Vicodin 5 mg 1 p.o. q.4-6 hours p.r.n. pain.  It should be noted that she had 2 g of Ancef provided as an IV prophylactic antibiotic preoperatively. There were no apparent complications.  Tourniquet time on the right side was 11 minutes.     Youlanda Mighty Lashanna Angelo, M.D.     RVS/MEDQ  D:  01/13/2014  T:  01/13/2014  Job:  [id number]

## 2014-01-13 NOTE — Brief Op Note (Signed)
01/13/2014  8:54 AM  PATIENT:  Grace Barajas  48 y.o. female  PRE-OPERATIVE DIAGNOSIS:  BILATERAL STENOSING SYNOVITIS FIRST DORSAL COMPARTMENT  POST-OPERATIVE DIAGNOSIS:  BILATERAL STENOSING SYNOVITIS FIRSR COMPARTMENT  PROCEDURE:  Procedure(s) with comments: BILATERAL 1ST DORSAL COMPARTMENT RELEASES (Bilateral) - bilateral  SURGEON:  Surgeon(s) and Role:    * Cammie Sickle., MD - Primary  PHYSICIAN ASSISTANT:   ASSISTANTS: surgical tech  ANESTHESIA:   general  EBL:  Total I/O In: 500 [I.V.:500] Out: -   BLOOD ADMINISTERED:none  DRAINS: none   LOCAL MEDICATIONS USED:  XYLOCAINE   SPECIMEN:  No Specimen  DISPOSITION OF SPECIMEN:  N/A  COUNTS:  YES  TOURNIQUET:   Total Tourniquet Time Documented: Upper Arm (laterality) - -[id number] minutes Upper Arm (laterality) - 11 minutes Total: Upper Arm (laterality) - -213086 minutes   DICTATION: .Other Dictation: Dictation Number [id number]  PLAN OF CARE: Discharge to home after PACU  PATIENT DISPOSITION:  PACU - hemodynamically stable.   Delay start of Pharmacological VTE agent (>24hrs) due to surgical blood loss or risk of bleeding: not applicable

## 2014-01-13 NOTE — Transfer of Care (Signed)
Immediate Anesthesia Transfer of Care Note  Patient: Grace Barajas  Procedure(s) Performed: Procedure(s) with comments: BILATERAL 1ST DORSAL COMPARTMENT RELEASES (Bilateral) - bilateral  Patient Location: PACU  Anesthesia Type:MAC  Level of Consciousness: awake and alert   Airway & Oxygen Therapy: Patient Spontanous Breathing and Patient connected to face mask oxygen  Post-op Assessment: Report given to PACU RN and Post -op Vital signs reviewed and stable  Post vital signs: Reviewed and stable  Complications: No apparent anesthesia complications

## 2014-01-13 NOTE — Op Note (Signed)
[id number] 

## 2014-01-13 NOTE — Anesthesia Preprocedure Evaluation (Signed)
Anesthesia Evaluation  Patient identified by MRN, date of birth, ID band Patient awake    Reviewed: Allergy & Precautions, H&P , NPO status , Patient's Chart, lab work & pertinent test results, reviewed documented beta blocker date and time   Airway Mallampati: II TM Distance: >3 FB Neck ROM: full    Dental   Pulmonary neg pulmonary ROS, shortness of breath and with exertion, asthma ,  breath sounds clear to auscultation        Cardiovascular negative cardio ROS  Rhythm:regular     Neuro/Psych PSYCHIATRIC DISORDERS negative neurological ROS     GI/Hepatic negative GI ROS, Neg liver ROS,   Endo/Other  negative endocrine ROS  Renal/GU negative Renal ROS  negative genitourinary   Musculoskeletal   Abdominal   Peds  Hematology negative hematology ROS (+)   Anesthesia Other Findings See surgeon's H&P   Reproductive/Obstetrics negative OB ROS                           Anesthesia Physical Anesthesia Plan  ASA: II  Anesthesia Plan: MAC   Post-op Pain Management:    Induction: Intravenous  Airway Management Planned: Simple Face Mask  Additional Equipment:   Intra-op Plan:   Post-operative Plan:   Informed Consent: I have reviewed the patients History and Physical, chart, labs and discussed the procedure including the risks, benefits and alternatives for the proposed anesthesia with the patient or authorized representative who has indicated his/her understanding and acceptance.   Dental Advisory Given  Plan Discussed with: CRNA and Surgeon  Anesthesia Plan Comments:         Anesthesia Quick Evaluation

## 2014-01-14 ENCOUNTER — Encounter (HOSPITAL_BASED_OUTPATIENT_CLINIC_OR_DEPARTMENT_OTHER): Payer: Self-pay | Admitting: Orthopedic Surgery

## 2014-05-23 ENCOUNTER — Encounter: Payer: Self-pay | Admitting: Family

## 2014-05-23 ENCOUNTER — Encounter (INDEPENDENT_AMBULATORY_CARE_PROVIDER_SITE_OTHER): Payer: Self-pay

## 2014-05-23 ENCOUNTER — Ambulatory Visit (INDEPENDENT_AMBULATORY_CARE_PROVIDER_SITE_OTHER): Payer: Medicare Other | Admitting: Family

## 2014-05-23 VITALS — BP 122/78 | HR 80 | Temp 98.6°F | Ht 66.75 in | Wt 212.6 lb

## 2014-05-23 DIAGNOSIS — Z01419 Encounter for gynecological examination (general) (routine) without abnormal findings: Secondary | ICD-10-CM

## 2014-05-23 DIAGNOSIS — Z0000 Encounter for general adult medical examination without abnormal findings: Secondary | ICD-10-CM

## 2014-05-23 DIAGNOSIS — Z124 Encounter for screening for malignant neoplasm of cervix: Secondary | ICD-10-CM

## 2014-05-23 DIAGNOSIS — R5381 Other malaise: Secondary | ICD-10-CM

## 2014-05-23 DIAGNOSIS — F3289 Other specified depressive episodes: Secondary | ICD-10-CM

## 2014-05-23 DIAGNOSIS — R5383 Other fatigue: Secondary | ICD-10-CM

## 2014-05-23 DIAGNOSIS — F329 Major depressive disorder, single episode, unspecified: Secondary | ICD-10-CM

## 2014-05-23 DIAGNOSIS — F32A Depression, unspecified: Secondary | ICD-10-CM

## 2014-05-23 DIAGNOSIS — F411 Generalized anxiety disorder: Secondary | ICD-10-CM

## 2014-05-23 NOTE — Patient Instructions (Signed)

## 2014-05-23 NOTE — Progress Notes (Signed)
Subjective:    Patient ID: Grace Barajas, female    DOB: 10/07/1966, 48 y.o.   MRN: [id number]  Pt presents to office for annual with pap. Pt states she fell 3 weeks ago on her "butt" [place] a water park. Pt states she went to the ED yesterday and they gave her a muscle relaxer and lidocaine patches.  Pt currently going to spine center for cervical fusions in the past.   Back Pain This is a chronic problem. The current episode started 1 to 4 weeks ago. The problem occurs constantly. The problem has been waxing and waning since onset. The pain is present in the gluteal and lumbar spine. The quality of the pain is described as aching. The pain radiates to the right thigh. The pain is at a severity of 9/10. The pain is moderate. The symptoms are aggravated by position, lying down and standing. Pertinent negatives include no bladder incontinence, bowel incontinence, chest pain, headaches or weakness. Risk factors include recent trauma. She has tried NSAIDs, analgesics and muscle relaxant for the symptoms. The treatment provided mild relief.  Anxiety Presents for follow-up visit. Patient reports no chest pain, excessive worry, insomnia, irritability, nervous/anxious behavior, palpitations or shortness of breath. Symptoms occur rarely. The severity of symptoms is mild. The quality of sleep is good.   Her past medical history is significant for anxiety/panic attacks and depression. Past treatments include SSRIs.      Review of Systems  Constitutional: Negative.  Negative for irritability.  HENT: Negative.   Eyes: Negative.   Respiratory: Negative.  Negative for shortness of breath.   Cardiovascular: Negative.  Negative for chest pain and palpitations.  Gastrointestinal: Negative.  Negative for bowel incontinence.  Endocrine: Negative.   Genitourinary: Negative.  Negative for bladder incontinence.  Musculoskeletal: Positive for back pain.  Neurological: Negative.  Negative for weakness and  headaches.  Hematological: Negative.   Psychiatric/Behavioral: Negative.  The patient is not nervous/anxious and does not have insomnia.   All other systems reviewed and are negative.      Objective:   Physical Exam  Vitals reviewed. Constitutional: She is oriented to person, place, and time. She appears well-developed and well-nourished. No distress.  HENT:  Head: Normocephalic and atraumatic.  Right Ear: External ear normal.  Left Ear: External ear normal.  Nose: Nose normal.  Mouth/Throat: Oropharynx is clear and moist.  Eyes: Pupils are equal, round, and reactive to light.  Neck: Normal range of motion. Neck supple. No thyromegaly present.  Cardiovascular: Normal rate, regular rhythm, normal heart sounds and intact distal pulses.   No murmur heard. Pulmonary/Chest: Effort normal and breath sounds normal. No respiratory distress. She has no wheezes.  Abdominal: Soft. Bowel sounds are normal. She exhibits no distension. There is no tenderness.  Genitourinary: Vagina normal and uterus normal. No vaginal discharge found.  Bimanual exam- no adnexal masses or tenderness, ovaries nonpalpable     Musculoskeletal: Normal range of motion. She exhibits no edema and no tenderness.  Lymphadenopathy:    She has cervical adenopathy.  Neurological: She is alert and oriented to person, place, and time. She has normal reflexes. No cranial nerve deficit.  Skin: Skin is warm and dry.  Psychiatric: She has a normal mood and affect. Her behavior is normal. Judgment and thought content normal.    BP 122/78  Pulse 80  Temp(Src) 98.6 F (37 C) (Oral)  Ht 5' 6.75" (1.695 m)  Wt 212 lb 9.6 oz (96.435 kg)  BMI 33.57 kg/m2  LMP 01/11/2013       Assessment & Plan:  1. Depression  2. GAD (generalized anxiety disorder)  3. Annual physical exam - CMP14+EGFR - Lipid panel - Vit D  25 hydroxy (rtn osteoporosis monitoring) - Thyroid Panel With TSH  4. Other malaise and fatigue - Anemia  Profile B  5. Encounter for routine gynecological examination - Pap IG, CT/NG w/ reflex HPV when ASC-U   Continue all meds Labs pending Health Maintenance reviewed-Pap today Diet and exercise encouraged RTO 1 year   Evelina Dun, FNP

## 2014-05-24 LAB — ANEMIA PROFILE B
Basophils Absolute: 0 10*3/uL (ref 0.0–0.2)
Basos: 1 %
EOS ABS: 0.4 10*3/uL (ref 0.0–0.4)
Eos: 7 %
Ferritin: 126 ng/mL (ref 15–150)
Folate: >19.9 ng/mL (ref >3.0)
HEMATOCRIT: 34.5 % (ref 34.0–46.6)
HEMOGLOBIN: 12.2 g/dL (ref 11.1–15.9)
Immature Grans (Abs): 0 10*3/uL (ref 0.0–0.1)
Immature Granulocytes: 0 %
Iron Saturation: 26 % (ref 15–55)
Iron: 98 ug/dL (ref 35–155)
Lymphocytes Absolute: 2.4 10*3/uL (ref 0.7–3.1)
Lymphs: 44 %
MCH: 29.5 pg (ref 26.6–33.0)
MCHC: 35.4 g/dL (ref 31.5–35.7)
MCV: 84 fL (ref 79–97)
Monocytes Absolute: 0.3 10*3/uL (ref 0.1–0.9)
Monocytes: 6 %
Neutrophils Absolute: 2.3 10*3/uL (ref 1.4–7.0)
Neutrophils Relative %: 42 %
Platelets: 431 10*3/uL — ABNORMAL HIGH (ref 150–379)
RBC: 4.13 x10E6/uL (ref 3.77–5.28)
RDW: 13.4 % (ref 12.3–15.4)
Retic Ct Pct: 1.8 % (ref 0.6–2.6)
TIBC: 378 ug/dL (ref 250–450)
UIBC: 280 ug/dL (ref 150–375)
Vitamin B-12: 1778 pg/mL — ABNORMAL HIGH (ref 211–946)
WBC: 5.5 10*3/uL (ref 3.4–10.8)

## 2014-05-24 LAB — CMP14+EGFR
ALBUMIN: 4.4 g/dL (ref 3.5–5.5)
ALK PHOS: 87 IU/L (ref 39–117)
ALT: 21 IU/L (ref 0–32)
AST: 12 IU/L (ref 0–40)
Albumin/Globulin Ratio: 1.5 (ref 1.1–2.5)
BUN / CREAT RATIO: 19 (ref 9–23)
BUN: 13 mg/dL (ref 6–24)
CALCIUM: 10.3 mg/dL — AB (ref 8.7–10.2)
CHLORIDE: 100 mmol/L (ref 97–108)
CO2: 24 mmol/L (ref 18–29)
Creatinine, Ser: 0.69 mg/dL (ref 0.57–1.00)
GFR calc Af Amer: 119 mL/min/{1.73_m2} (ref >59)
GFR calc non Af Amer: 103 mL/min/{1.73_m2} (ref >59)
GLUCOSE: 118 mg/dL — AB (ref 65–99)
Globulin, Total: 2.9 g/dL (ref 1.5–4.5)
Potassium: 3.9 mmol/L (ref 3.5–5.2)
Sodium: 142 mmol/L (ref 134–144)
Total Bilirubin: 0.3 mg/dL (ref 0.0–1.2)
Total Protein: 7.3 g/dL (ref 6.0–8.5)

## 2014-05-24 LAB — THYROID PANEL WITH TSH
Free Thyroxine Index: 1.7 (ref 1.2–4.9)
T3 Uptake Ratio: 26 % (ref 24–39)
T4 TOTAL: 6.7 ug/dL (ref 4.5–12.0)
TSH: 1.9 u[IU]/mL (ref 0.450–4.500)

## 2014-05-24 LAB — LIPID PANEL
CHOLESTEROL TOTAL: 180 mg/dL (ref 100–199)
Chol/HDL Ratio: 5.5 ratio units — ABNORMAL HIGH (ref 0.0–4.4)
HDL: 33 mg/dL — AB (ref >39)
LDL CALC: 77 mg/dL (ref 0–99)
TRIGLYCERIDES: 349 mg/dL — AB (ref 0–149)
VLDL Cholesterol Cal: 70 mg/dL — ABNORMAL HIGH (ref 5–40)

## 2014-05-24 LAB — VITAMIN D 25 HYDROXY (VIT D DEFICIENCY, FRACTURES): Vit D, 25-Hydroxy: 24.9 ng/mL — ABNORMAL LOW (ref 30.0–100.0)

## 2014-05-26 ENCOUNTER — Telehealth: Payer: Self-pay | Admitting: Family

## 2014-05-26 LAB — PAP IG, CT-NG, RFX HPV ASCU
CHLAMYDIA, NUC. ACID AMP: NEGATIVE
Gonococcus by Nucleic Acid Amp: NEGATIVE
PAP Smear Comment: 0

## 2014-05-27 ENCOUNTER — Telehealth: Payer: Self-pay | Admitting: Family

## 2014-05-27 DIAGNOSIS — M545 Low back pain, unspecified: Secondary | ICD-10-CM | POA: Insufficient documentation

## 2014-05-27 MED ORDER — HYDROCODONE-ACETAMINOPHEN 5-325 MG PO TABS: 1.0000 | ORAL_TABLET | Freq: Four times a day (QID) | ORAL | Status: DC | PRN

## 2014-05-27 NOTE — Telephone Encounter (Signed)
Left message to pickup script with photo ID 

## 2014-05-27 NOTE — Telephone Encounter (Signed)
Handled in another encounter

## 2014-05-30 ENCOUNTER — Telehealth: Payer: Self-pay | Admitting: Family

## 2014-05-30 NOTE — Telephone Encounter (Signed)
Message copied by Cline Crock on Mon May 30, 2014 12:49 PM ------      Message from: Ebony, [place] A      Created: Fri May 27, 2014  8:58 AM       Pap negative for lesion and malignancy      Kidney and liver function stable      Cholesterol high-RX      Vit D low-RX sent      Thyroid levels      Anemia Profile (Iron levels, Folate, WBC, Hgb, & Plts)- WNL, Vit B-12 elevated        ------

## 2014-05-31 NOTE — Telephone Encounter (Signed)
Needs cholestrol & vit D rx to [facility].  Call her at [phone] to discuss next time appt needed and discuss choles. rx

## 2014-06-01 NOTE — Telephone Encounter (Signed)
Patient aware.

## 2014-06-03 ENCOUNTER — Telehealth: Payer: Self-pay

## 2014-06-03 ENCOUNTER — Telehealth: Payer: Self-pay | Admitting: *Deleted

## 2014-06-03 DIAGNOSIS — R5383 Other fatigue: Principal | ICD-10-CM

## 2014-06-03 DIAGNOSIS — R5381 Other malaise: Secondary | ICD-10-CM

## 2014-06-03 NOTE — Telephone Encounter (Signed)
She says some cheap cholesterol med and vit d, was supposed to be sent into [facility] in [place]

## 2014-06-03 NOTE — Telephone Encounter (Signed)
Patient wants a referral for a sleep study

## 2014-06-06 MED ORDER — ATORVASTATIN CALCIUM 40 MG PO TABS: 40.0000 mg | ORAL_TABLET | Freq: Every day | ORAL | Status: DC

## 2014-06-06 MED ORDER — VITAMIN D (ERGOCALCIFEROL) 1.25 MG (50000 UNIT) PO CAPS: 50000.0000 [IU] | ORAL_CAPSULE | ORAL | Status: DC

## 2014-06-06 NOTE — Telephone Encounter (Signed)
RX sent to pharmacy  

## 2014-06-07 ENCOUNTER — Telehealth: Payer: Self-pay | Admitting: Family

## 2014-06-07 ENCOUNTER — Telehealth: Payer: Self-pay | Admitting: Family Medicine

## 2014-06-07 NOTE — Telephone Encounter (Signed)
Changed pharmacy in the computer.Marland Kitchen

## 2014-06-07 NOTE — Telephone Encounter (Signed)
Labs put in the mail for patient.. Please advise other

## 2014-06-10 ENCOUNTER — Emergency Department (HOSPITAL_COMMUNITY)
Admission: EM | Admit: 2014-06-10 | Discharge: 2014-06-10 | Disposition: A | Discharge status: Home / Self Care | Payer: Medicare Other | Attending: Emergency Medicine | Admitting: Emergency Medicine

## 2014-06-10 ENCOUNTER — Encounter (HOSPITAL_COMMUNITY): Payer: Self-pay | Admitting: Emergency Medicine

## 2014-06-10 DIAGNOSIS — Z79899 Other long term (current) drug therapy: Secondary | ICD-10-CM | POA: Diagnosis not present

## 2014-06-10 DIAGNOSIS — H538 Other visual disturbances: Secondary | ICD-10-CM

## 2014-06-10 DIAGNOSIS — F329 Major depressive disorder, single episode, unspecified: Secondary | ICD-10-CM | POA: Diagnosis not present

## 2014-06-10 DIAGNOSIS — F3289 Other specified depressive episodes: Secondary | ICD-10-CM | POA: Insufficient documentation

## 2014-06-10 DIAGNOSIS — Z8709 Personal history of other diseases of the respiratory system: Secondary | ICD-10-CM | POA: Diagnosis not present

## 2014-06-10 DIAGNOSIS — F411 Generalized anxiety disorder: Secondary | ICD-10-CM | POA: Diagnosis not present

## 2014-06-10 NOTE — ED Notes (Signed)
Pt reports she got up from sleep and noticed blurred vision in left eye.  EMS reports negative stroke screen.

## 2014-06-10 NOTE — ED Notes (Signed)
Pt states she woke up out her sleep suddenly feeling unbalanced and with blurry vision in left eye. Pt states she took her lipitor she was prescribed. Pt reports tingling in left arm, and headaches. Pt states she feels "off". Pt alert and oriented.

## 2014-06-10 NOTE — ED Provider Notes (Signed)
CSN: [id number]     Arrival date & time 06/10/14  0440 History   First MD Initiated Contact with Patient 06/10/14 0456     Chief Complaint  Patient presents with  . Blurred Vision     (Consider location/radiation/quality/duration/timing/severity/associated sxs/prior Treatment) HPI Grace Barajas is a 48 year old female here with new onset blurry vision. Patient states she went to bed around 2 AM this morning and awoke suddenly at 3 AM with the inability to see out of her left eye. He said states she can only see fine however words or blurry to her now. Patient suffered an injury as a child in her right eye and that I always has had poor vision. Patient states she's had intermittent headaches over the past couple of weeks as well. She has not had this evaluated by a physician. Patient is denying any pain in the eye. She denies any recent infections fevers chest pain shortness of breath abdominal pain or changes in her urination or stool. Patient states she's still having the symptoms now where words or blurry and she cannot see that she normally can. She denies this ever occurring in the past.   Past Medical History  Diagnosis Date  . Sinus disorder   . Anxiety   . Depression   . Wears glasses   . Snores    Past Surgical History  Procedure Laterality Date  . Tubal ligation    . Cervical fusion      C1/C2  . Robotic assisted total hysterectomy N/A 01/21/2013    Procedure: ROBOTIC ASSISTED TOTAL HYSTERECTOMY;  Surgeon: Marvene Staff, MD;  Location: [facility];  Service: Gynecology;  Laterality: N/A;  . Bilateral salpingectomy Bilateral 01/21/2013    Procedure: BILATERAL SALPINGECTOMY;  Surgeon: Marvene Staff, MD;  Location: [facility];  Service: Gynecology;  Laterality: Bilateral;  . Carpal tunnel release Left 07/03/2007  . Carpal tunnel release Right 07/31/2007  . Dorsal compartment release Bilateral 01/13/2014    Procedure: BILATERAL 1ST DORSAL COMPARTMENT RELEASES;  Surgeon: Cammie Sickle., MD;  Location: [facility];  Service: Orthopedics;  Laterality: Bilateral;  bilateral   History reviewed. No pertinent family history. History  Substance Use Topics  . Smoking status: Never Smoker   . Smokeless tobacco: Never Used  . Alcohol Use: Yes     Comment: social   OB History   Grav Para Term Preterm Abortions TAB SAB Ect Mult Living                 Review of Systems 10 Systems reviewed and are negative for acute change except as noted in the HPI.    Allergies  Morphine and related  Home Medications   Prior to Admission medications   Medication Sig Start Date End Date Taking? Authorizing Provider  atorvastatin (LIPITOR) 40 MG tablet Take 40 mg by mouth daily.   Yes Historical Provider, MD  Biotin 5 MG TABS Take by mouth.   Yes Historical Provider, MD  ferrous sulfate 325 (65 FE) MG tablet Take 325 mg by mouth daily with breakfast.   Yes Historical Provider, MD  FLUoxetine (PROZAC) 10 MG capsule Take 10 mg by mouth daily.    Yes Historical Provider, MD  HYDROcodone-acetaminophen (NORCO/VICODIN) 5-325 MG per tablet Take 1 tablet by mouth every 6 (six) hours as needed for moderate pain. 05/27/14  Yes Sharion Balloon, FNP  Multiple Vitamins-Minerals (MULTIVITAMIN WITH MINERALS) tablet Take 1 tablet by mouth daily.   Yes  Historical Provider, MD  Omega-3 Fatty Acids (FISH OIL) 1000 MG CAPS Take 1 capsule by mouth daily.    Yes Historical Provider, MD  perphenazine (TRILAFON) 2 MG tablet Take 2 mg by mouth At bedtime.  10/10/11  Yes Historical Provider, MD  tiZANidine (ZANAFLEX) 4 MG tablet Take 4 mg by mouth every 6 (six) hours as needed for muscle spasms.  05/30/14  Yes Historical Provider, MD  vitamin B-12 (CYANOCOBALAMIN) 1000 MCG tablet Take 1,000 mcg by mouth daily.   Yes Historical Provider, MD  Vitamin D, Ergocalciferol, (DRISDOL) 50000 UNITS CAPS capsule Take 1 capsule (50,000 Units total) by mouth every 7 (seven) days. 06/06/14   Sharion Balloon,  FNP   BP 119/71  Pulse 79  Temp(Src) 98.4 F (36.9 C) (Oral)  Resp 16  Ht 5' 5.5" (1.664 m)  Wt 206 lb (93.441 kg)  BMI 33.75 kg/m2  SpO2 100%  LMP 01/11/2013 Physical Exam  Nursing note and vitals reviewed. Constitutional: She is oriented to person, place, and time. She appears well-developed and well-nourished. No distress.  HENT:  Head: Normocephalic and atraumatic.  Mouth/Throat: Oropharynx is clear and moist. No oropharyngeal exudate.  Eyes: Conjunctivae and EOM are normal. Right eye exhibits no discharge. Left eye exhibits no discharge. No scleral icterus.  Right eye has an irregular pupil and corneal abrasion.  Right pupil did not react to light. Right eye vision is 20/200. Left eye vision is 20/100. Pupil reacts to light. Extraocular muscles are intact bilaterally. There is no pain with extraocular movements.  Neck: Normal range of motion. Neck supple. No JVD present. No tracheal deviation present. No thyromegaly present.  Cardiovascular: Normal rate, regular rhythm and normal heart sounds.  Exam reveals no gallop and no friction rub.   No murmur heard. Pulmonary/Chest: Effort normal and breath sounds normal. No respiratory distress. She has no wheezes. She exhibits no tenderness.  Abdominal: Soft. Bowel sounds are normal. She exhibits no distension and no mass. There is no tenderness. There is no rebound and no guarding.  Musculoskeletal: Normal range of motion. She exhibits no edema and no tenderness.  Lymphadenopathy:    She has no cervical adenopathy.  Neurological: She is alert and oriented to person, place, and time. No cranial nerve deficit. She exhibits normal muscle tone. Coordination normal.  Skin: Skin is warm and dry. No rash noted. She is not diaphoretic. No erythema. No pallor.    ED Course  Procedures (including critical care time) Labs Review Labs Reviewed - No data to display  Imaging Review No results found.   EKG Interpretation None      MDM    Final diagnoses:  Blurry vision, left eye    Grace Barajas awoke this morning with sudden onset blurry vision in her left eye. I have a high concern for an ophthomological emergency. I spoke with Dr.Stonecipher regarding this patient and he requests to see her in his clinic this morning at 7:30 AM.I sent Dr. Lucita Ferrara the patient's information and informed patient of the plan. She is amenable to this plan and she was given street address of the ophthalmology clinic. Patient's vital signs continued to be stable, patient appears comfortable in bed and is in no acute distress. She states that her left eye continues to be blurry and she cannot read large warts around her. Patient is safe to discharge and was instructed to go straight to the ophthalmology clinic.    Everlene Balls, MD 06/10/14 [phone]

## 2014-06-10 NOTE — Discharge Instructions (Signed)
Blurred Vision Ms. Pala, you were seen today for blurry vision.  Please report immediately to the ophthomology clinic and see. Dr. Lucita Ferrara.  [address] Appointment time: 7:30am.  Thank you. You have been seen today complaining of blurred vision. This means you have a loss of ability to see small details.  CAUSES  Blurred vision can be a symptom of underlying eye problems, such as:  Aging of the eye (presbyopia).  Glaucoma.  Cataracts.  Eye infection.  Eye-related migraine.  Diabetes mellitus.  Fatigue.  Migraine headaches.  High blood pressure.  Breakdown of the back of the eye (macular degeneration).  Problems caused by some medications. The most common cause of blurred vision is the need for eyeglasses or a new prescription. Today in the emergency department, no cause for your blurred vision can be found. SYMPTOMS  Blurred vision is the loss of visual sharpness and detail (acuity). DIAGNOSIS  Should blurred vision continue, you should see your caregiver. If your caregiver is your primary care physician, he or she may choose to refer you to another specialist.  TREATMENT  Do not ignore your blurred vision. Make sure to have it checked out to see if further treatment or referral is necessary. SEEK MEDICAL CARE IF:  You are unable to get into a specialist so we can help you with a referral. SEEK IMMEDIATE MEDICAL CARE IF: You have severe eye pain, severe headache, or sudden loss of vision. MAKE SURE YOU:   Understand these instructions.  Will watch your condition.  Will get help right away if you are not doing well or get worse. Document Released: 10/17/2003 Document Revised: 01/06/2012 Document Reviewed: 05/18/2008 [facility] Patient Information 2015 [place], [place]. This information is not intended to replace advice given to you by your health care provider. Make sure you discuss any questions you have with your health care provider.

## 2014-06-10 NOTE — ED Notes (Signed)
Discharge instructions reviewed with pt. Pt verbalized understanding.   

## 2014-07-29 ENCOUNTER — Institutional Professional Consult (permissible substitution): Payer: Medicare Other | Admitting: Internal Medicine

## 2014-07-29 ENCOUNTER — Encounter: Payer: Self-pay | Admitting: Pulmonary Disease

## 2014-07-29 ENCOUNTER — Ambulatory Visit: Payer: Medicare Other | Admitting: Pulmonary Disease

## 2014-07-29 ENCOUNTER — Encounter (INDEPENDENT_AMBULATORY_CARE_PROVIDER_SITE_OTHER): Payer: Self-pay

## 2014-07-29 VITALS — BP 90/60 | HR 80 | Temp 98.1°F | Ht 65.5 in | Wt 206.2 lb

## 2014-07-29 DIAGNOSIS — G4733 Obstructive sleep apnea (adult) (pediatric): Secondary | ICD-10-CM | POA: Diagnosis not present

## 2014-07-29 NOTE — Patient Instructions (Signed)
Will start on cpap at a moderate pressure level.  Please call if having tolerance issues. Work on weight loss followup with me in 8 weeks.  

## 2014-07-29 NOTE — Assessment & Plan Note (Signed)
The patient has mild to moderate obstructive sleep apnea by her recent study, but is clearly symptomatic both at night and during the day. She feels this impacts her quality of life, and would prefer to treat this aggressively. I have recommended a trial of CPAP while she is working on weight loss, and the patient is agreeable to this approach I will set the patient up on cpap at a moderate pressure level to allow for desensitization, and will troubleshoot the device over the next 4-6weeks if needed.  The pt is to call me if having issues with tolerance.  Will then optimize the pressure once patient is able to wear cpap on a consistent basis.

## 2014-07-29 NOTE — Progress Notes (Signed)
Subjective:    Patient ID: Grace Barajas, female    DOB: 1966-06-19, 48 y.o.   MRN: [id number]  HPI The patient is a 48 year old female who I've been asked to see for management of obstructive sleep apnea. She has had a recent sleep study at [facility] where she was found to have an AHI of 15 events per hour with oxygen desaturation as low as 89%. She has been noted to have loud snoring, and has frequent choking arousals at night while sleeping. She also has frequent awakenings, and nonrestorative sleep. She feels that she is tired all of the time, with the addition of sleep pressure intermittently during the day. She will also get sleepy in the evening watching television or trying to read.  She has occasional sleepiness with driving. The patient states that her weight is up over 50 pounds in the last 2 years, and her Epworth score today is 6   Sleep Questionnaire What time do you typically go to bed?( Between what hours) 10p-12a 10p-12a at 1332 on 07/29/14 by Inge Rise, CMA How long does it take you to fall asleep? 2-3 hrs 2-3 hrs at 1332 on 07/29/14 by Inge Rise, CMA How many times during the night do you wake up? 3 3 at 1332 on 07/29/14 by Inge Rise, CMA What time do you get out of bed to start your day? 0630 0630 at 1332 on 07/29/14 by Inge Rise, CMA Do you drive or operate heavy machinery in your occupation? No No at 1332 on 07/29/14 by Inge Rise, CMA How much has your weight changed (up or down) over the past two years? (In pounds) 50 lb (22.68 kg) 50 lb (22.68 kg) at 1332 on 07/29/14 by Inge Rise, CMA Have you ever had a sleep study before? Yes Yes at 1332 on 07/29/14 by Inge Rise, CMA If yes, [facility] at 1332 on 07/29/14 by Inge Rise, CMA If yes, date of study? 07/22/14 07/22/14 at 1332 on 07/29/14 by Inge Rise, CMA Do you currently use CPAP? No No at 1332 on 07/29/14 by Inge Rise, CMA Do you  wear oxygen at any time? No    Review of Systems  Constitutional: Negative for fever and unexpected weight change.  HENT: Negative for congestion, dental problem, ear pain, nosebleeds, postnasal drip, rhinorrhea, sinus pressure, sneezing, sore throat and trouble swallowing.   Eyes: Negative for redness and itching.  Respiratory: Positive for shortness of breath. Negative for cough, chest tightness and wheezing.   Cardiovascular: Positive for palpitations. Negative for leg swelling.  Gastrointestinal: Negative for nausea and vomiting.  Genitourinary: Negative for dysuria.  Musculoskeletal: Positive for arthralgias. Negative for joint swelling.  Skin: Negative for rash.  Neurological: Positive for headaches.  Hematological: Does not bruise/bleed easily.  Psychiatric/Behavioral: Positive for dysphoric mood. The patient is nervous/anxious.        Objective:   Physical Exam Constitutional:  Obese female, no acute distress  HENT:  Nares patent without discharge, but deviated septum to the left with narrowing.  Oropharynx without exudate, palate and uvula thickened and elongated  Eyes:  Perrla, eomi, no scleral icterus  Neck:  No JVD, no TMG  Cardiovascular:  Normal rate, regular rhythm, no rubs or gallops.  No murmurs        Intact distal pulses  Pulmonary :  Normal breath sounds, no stridor or respiratory distress   No rales, rhonchi, or wheezing  Abdominal:  Soft, nondistended, bowel sounds present.  No tenderness noted.   Musculoskeletal:  mild lower extremity edema noted.  Lymph Nodes:  No cervical lymphadenopathy noted  Skin:  No cyanosis noted  Neurologic:  Alert, appropriate, moves all 4 extremities without obvious deficit.         Assessment & Plan:

## 2014-09-26 ENCOUNTER — Ambulatory Visit: Payer: Medicare Other | Admitting: Pulmonary Disease

## 2014-09-26 ENCOUNTER — Encounter: Payer: Self-pay | Admitting: Pulmonary Disease

## 2014-09-26 VITALS — BP 122/72 | HR 73 | Temp 98.0°F | Ht 65.0 in | Wt 214.4 lb

## 2014-09-26 DIAGNOSIS — G4733 Obstructive sleep apnea (adult) (pediatric): Secondary | ICD-10-CM

## 2014-09-26 NOTE — Assessment & Plan Note (Signed)
The patient is trying very hard to wear the C Pap device, but she feels that she is not getting enough air on the current setting. She is having no issues with her mask fit, and has even tried desensitization to see if this would help. I suspect the issue is inadequate airflow rather than claustrophobia associated with pressure.  I will keep her on the automatic setting, but will start her out at a higher pressure level. If we are not successful with improved compliance, the patient is willing to consider a dental appliance. I have also encouraged her to work aggressively on weight loss.

## 2014-09-26 NOTE — Progress Notes (Signed)
   Subjective:    Patient ID: Grace Barajas, female    DOB: [birth year], 48 y.o.   MRN: [id number]  HPI The patient comes in today for follow-up of her obstructive sleep apnea. She was started on CPAP at a moderate pressure level last visit, but has had great difficulty with tolerance. She feels that she is not getting enough air, rather than a problem with too much air causing claustrophobia. He has even tried desensitization. She is willing to continue working with the C Pap device. She is having no issues with mask fit or leaking.   Review of Systems  Constitutional: Negative for fever and unexpected weight change.  HENT: Negative for congestion, dental problem, ear pain, nosebleeds, postnasal drip, rhinorrhea, sinus pressure, sneezing, sore throat and trouble swallowing.   Eyes: Negative for redness and itching.  Respiratory: Negative for cough, chest tightness, shortness of breath and wheezing.   Cardiovascular: Negative for palpitations and leg swelling.  Gastrointestinal: Negative for nausea and vomiting.  Genitourinary: Negative for dysuria.  Musculoskeletal: Negative for joint swelling.  Skin: Negative for rash.  Neurological: Negative for headaches.  Hematological: Does not bruise/bleed easily.  Psychiatric/Behavioral: Negative for dysphoric mood. The patient is not nervous/anxious.        Objective:   Physical Exam Obese female in no acute distress Nose without purulence or discharge noted No skin breakdown or pressure necrosis from the C Pap mask Neck without lymphadenopathy or thyromegaly Lower extremities with mild edema, no cyanosis Alert and oriented, moves all 4 extremities.       Assessment & Plan:

## 2014-09-26 NOTE — Patient Instructions (Signed)
Will increase the starting pressure on your machine to see if it is better tolerated.  If you continue to have issues, we can consider a dental appliance as we discussed. Work on weight loss Please schedule followup with me in 45mos, but I need you to call me in 2 weeks to give update after your pressure is increased.

## 2014-11-03 DIAGNOSIS — G4733 Obstructive sleep apnea (adult) (pediatric): Secondary | ICD-10-CM | POA: Diagnosis not present

## 2014-11-30 DIAGNOSIS — H6123 Impacted cerumen, bilateral: Secondary | ICD-10-CM | POA: Diagnosis not present

## 2014-11-30 DIAGNOSIS — J018 Other acute sinusitis: Secondary | ICD-10-CM | POA: Diagnosis not present

## 2014-12-04 DIAGNOSIS — G4733 Obstructive sleep apnea (adult) (pediatric): Secondary | ICD-10-CM | POA: Diagnosis not present

## 2014-12-30 DIAGNOSIS — F251 Schizoaffective disorder, depressive type: Secondary | ICD-10-CM | POA: Diagnosis not present

## 2014-12-30 DIAGNOSIS — G4700 Insomnia, unspecified: Secondary | ICD-10-CM | POA: Insufficient documentation

## 2014-12-30 DIAGNOSIS — Z79899 Other long term (current) drug therapy: Secondary | ICD-10-CM | POA: Diagnosis not present

## 2015-01-02 ENCOUNTER — Telehealth: Payer: Self-pay | Admitting: Pulmonary Disease

## 2015-01-02 DIAGNOSIS — B353 Tinea pedis: Secondary | ICD-10-CM | POA: Diagnosis not present

## 2015-01-02 DIAGNOSIS — Z23 Encounter for immunization: Secondary | ICD-10-CM | POA: Diagnosis not present

## 2015-01-02 DIAGNOSIS — G4733 Obstructive sleep apnea (adult) (pediatric): Secondary | ICD-10-CM | POA: Diagnosis not present

## 2015-01-02 NOTE — Telephone Encounter (Signed)
Per 09/26/14 OV w/ KC: OSA (obstructive sleep apnea) - Kathee Delton, MD at 09/26/2014 10:26 AM     Status: Written Related Problem: OSA (obstructive sleep apnea)   Expand All Collapse All   The patient is trying very hard to wear the C Pap device, but she feels that she is not getting enough air on the current setting. She is having no issues with her mask fit, and has even tried desensitization to see if this would help. I suspect the issue is inadequate airflow rather than claustrophobia associated with pressure. I will keep her on the automatic setting, but will start her out at a higher pressure level. If we are not successful with improved compliance, the patient is willing to consider a dental appliance. I have also encouraged her to work aggressively on weight loss      --  Called spoke with pt. She reports for the past 2 months she has not been able to wear CPAP d/t back to back sinus infections and cold. Reports [facility] is trying to take aware machine. She is wanting to come in and discuss alternatives with [facility]. appt scheduled for tomorrow. Nothing further needed

## 2015-01-03 ENCOUNTER — Ambulatory Visit: Payer: Self-pay | Admitting: Pulmonary Disease

## 2015-01-16 ENCOUNTER — Ambulatory Visit (INDEPENDENT_AMBULATORY_CARE_PROVIDER_SITE_OTHER): Payer: Medicare Other | Admitting: Family Medicine

## 2015-01-16 ENCOUNTER — Encounter: Payer: Self-pay | Admitting: Family Medicine

## 2015-01-16 VITALS — BP 110/68 | HR 89 | Temp 98.1°F | Ht 65.0 in | Wt 208.2 lb

## 2015-01-16 DIAGNOSIS — J0100 Acute maxillary sinusitis, unspecified: Secondary | ICD-10-CM | POA: Diagnosis not present

## 2015-01-16 DIAGNOSIS — B353 Tinea pedis: Secondary | ICD-10-CM

## 2015-01-16 MED ORDER — NYSTATIN-TRIAMCINOLONE 100000-0.1 UNIT/GM-% EX OINT: 1.0000 "application " | TOPICAL_OINTMENT | Freq: Two times a day (BID) | CUTANEOUS | Status: DC

## 2015-01-16 MED ORDER — BETAMETHASONE SOD PHOS & ACET 6 (3-3) MG/ML IJ SUSP
6.0000 mg | Freq: Once | INTRAMUSCULAR | Status: AC
  Administered 2015-01-16: 6 mg via INTRAMUSCULAR

## 2015-01-16 MED ORDER — AZITHROMYCIN 250 MG PO TABS: ORAL_TABLET | ORAL | Status: DC

## 2015-01-16 NOTE — Progress Notes (Signed)
Subjective:  Patient ID: Grace Barajas, female    DOB: 07-Feb-1966  Age: 49 y.o. MRN: [id number]  CC: Sinusitis and Rash   HPI Grace Barajas presents for Symptoms include congestion, facial pain, nasal congestion, no  fever, non productive cough, post nasal drip and sinus pressure with no fever, chills, night sweats or weight loss. Onset of symptoms was a few days ago, gradually worsening since that time. Pt.is drinking moderate amounts of fluids.   Also rash on feet feels deep. Not responsive to ketoconazole tx recently tried. History Grace Barajas has a past medical history of Sinus disorder; Anxiety; Depression; Wears glasses; Snores; and High cholesterol.   She has past surgical history that includes Tubal ligation; Cervical fusion; Robotic assisted total hysterectomy (N/A, 01/21/2013); Bilateral salpingectomy (Bilateral, 01/21/2013); Carpal tunnel release (Left, 07/03/2007); Carpal tunnel release (Right, 07/31/2007); Dorsal compartment release (Bilateral, 01/13/2014); and Abdominal hysterectomy.   Her family history includes Asthma in her brother and son.She reports that she has never smoked. She has never used smokeless tobacco. She reports that she drinks alcohol. She reports that she does not use illicit drugs.  Current Outpatient Prescriptions on File Prior to Visit  Medication Sig Dispense Refill  . Biotin 5 MG TABS Take by mouth.    . ferrous sulfate 325 (65 FE) MG tablet Take 325 mg by mouth daily with breakfast.    . FLUoxetine (PROZAC) 10 MG capsule Take 40 mg by mouth daily.     . Multiple Vitamins-Minerals (MULTIVITAMIN WITH MINERALS) tablet Take 1 tablet by mouth daily.    . Omega-3 Fatty Acids (FISH OIL) 1000 MG CAPS Take 1 capsule by mouth daily.     . Vitamin D, Ergocalciferol, (DRISDOL) 50000 UNITS CAPS capsule Take 1 capsule (50,000 Units total) by mouth every 7 (seven) days. 30 capsule 6  . tiZANidine (ZANAFLEX) 4 MG tablet Take 4 mg by mouth every 6 (six) hours as  needed for muscle spasms.      No current facility-administered medications on file prior to visit.    ROS Review of Systems  Constitutional: Negative for fever, chills, activity change and appetite change.  HENT: Positive for congestion, postnasal drip, rhinorrhea and sinus pressure. Negative for ear discharge, ear pain, hearing loss, nosebleeds, sneezing and trouble swallowing.   Respiratory: Negative for chest tightness and shortness of breath.   Cardiovascular: Negative for chest pain and palpitations.  Skin: Negative for rash.    Objective:  BP 110/68 mmHg  Pulse 89  Temp(Src) 98.1 F (36.7 C) (Oral)  Ht 5\' 5"  (1.651 m)  Wt 208 lb 3.2 oz (94.439 kg)  BMI 34.65 kg/m2  LMP 01/11/2013  Physical Exam  Constitutional: She appears well-developed and well-nourished.  HENT:  Head: Normocephalic and atraumatic.  Right Ear: Tympanic membrane and external ear normal. No decreased hearing is noted.  Left Ear: Tympanic membrane and external ear normal. No decreased hearing is noted.  Nose: Mucosal edema present. Right sinus exhibits no frontal sinus tenderness. Left sinus exhibits no frontal sinus tenderness.  Mouth/Throat: No oropharyngeal exudate or posterior oropharyngeal erythema.  Neck: No Brudzinski's sign noted.  Pulmonary/Chest: Breath sounds normal. No respiratory distress.  Lymphadenopathy:       Head (right side): No preauricular adenopathy present.       Head (left side): No preauricular adenopathy present.       Right cervical: No superficial cervical adenopathy present.      Left cervical: No superficial cervical adenopathy present.    Assessment &  Plan:   Grace Barajas was seen today for sinusitis and rash.  Diagnoses and all orders for this visit:  Tinea pedis of both feet Orders: -     betamethasone acetate-betamethasone sodium phosphate (CELESTONE) injection 6 mg; Inject 1 mL (6 mg total) into the muscle once.  Acute maxillary sinusitis, recurrence not  specified Orders: -     betamethasone acetate-betamethasone sodium phosphate (CELESTONE) injection 6 mg; Inject 1 mL (6 mg total) into the muscle once.  Other orders -     azithromycin (ZITHROMAX Z-PAK) 250 MG tablet; Take two right away Then one a day for the next 4 days. -     nystatin-triamcinolone ointment (MYCOLOG); Apply 1 application topically 2 (two) times daily.   I have discontinued Grace Barajas's HYDROcodone-acetaminophen. I am also having her start on azithromycin and nystatin-triamcinolone ointment. Additionally, I am having her maintain her FLUoxetine, ferrous sulfate, multivitamin with minerals, Fish Oil, Biotin, Vitamin D (Ergocalciferol), tiZANidine, atorvastatin, busPIRone, ferrous sulfate, and perphenazine. We administered betamethasone acetate-betamethasone sodium phosphate.  Meds ordered this encounter  Medications  . atorvastatin (LIPITOR) 40 MG tablet    Sig: 40 mg daily.  . busPIRone (BUSPAR) 5 MG tablet    Sig: Take by mouth.  . ferrous sulfate 325 (65 FE) MG tablet    Sig: Take 325 mg by mouth 2 (two) times daily.  Marland Kitchen perphenazine (TRILAFON) 4 MG tablet    Sig: Take 4 mg by mouth at bedtime.  Marland Kitchen azithromycin (ZITHROMAX Z-PAK) 250 MG tablet    Sig: Take two right away Then one a day for the next 4 days.    Dispense:  6 each    Refill:  0  . nystatin-triamcinolone ointment (MYCOLOG)    Sig: Apply 1 application topically 2 (two) times daily.    Dispense:  30 g    Refill:  0  . betamethasone acetate-betamethasone sodium phosphate (CELESTONE) injection 6 mg    Sig:      Follow-up: Return if symptoms worsen or fail to improve.  Claretta Fraise, M.D.

## 2015-01-17 ENCOUNTER — Other Ambulatory Visit: Payer: Self-pay | Admitting: *Deleted

## 2015-01-17 ENCOUNTER — Telehealth: Payer: Self-pay | Admitting: Family Medicine

## 2015-01-17 MED ORDER — HYDROCODONE-HOMATROPINE 5-1.5 MG/5ML PO SYRP: 5.0000 mL | ORAL_SOLUTION | Freq: Four times a day (QID) | ORAL | Status: DC | PRN

## 2015-01-17 NOTE — Telephone Encounter (Signed)
The prescription is ready. However, it may interact with her morphine allergy. If so we may have to change the medication.

## 2015-01-17 NOTE — Progress Notes (Signed)
RX reprinted Mmm signed first RX by mistake

## 2015-01-18 NOTE — Telephone Encounter (Signed)
Script at front, pt aware.

## 2015-02-08 ENCOUNTER — Encounter: Payer: Self-pay | Admitting: Physician Assistant

## 2015-02-08 ENCOUNTER — Encounter (INDEPENDENT_AMBULATORY_CARE_PROVIDER_SITE_OTHER): Payer: Self-pay

## 2015-02-08 ENCOUNTER — Ambulatory Visit (INDEPENDENT_AMBULATORY_CARE_PROVIDER_SITE_OTHER): Payer: Medicare Other | Admitting: Physician Assistant

## 2015-02-08 VITALS — BP 119/70 | HR 71 | Temp 98.2°F | Ht 65.0 in | Wt 206.0 lb

## 2015-02-08 DIAGNOSIS — B353 Tinea pedis: Secondary | ICD-10-CM | POA: Diagnosis not present

## 2015-02-08 MED ORDER — TERBINAFINE HCL 250 MG PO TABS: 250.0000 mg | ORAL_TABLET | Freq: Every day | ORAL | Status: DC

## 2015-02-08 NOTE — Patient Instructions (Signed)
Athlete's Foot  Athlete's foot is a skin infection caused by a fungus. Athlete's foot is often seen between or under the toes. It can also be seen on the bottom of the foot. Athlete's foot can spread to other people by sharing towels or shower stalls. HOME CARE  Only take medicines as told by your doctor. Do not use steroid creams.  Wash your feet daily. Dry your feet well, especially between the toes.  Change your socks every day. Wear cotton or wool socks.  Change your socks 2 to 3 times a day in hot weather.  Wear sandals or canvas tennis shoes with good airflow.  If you have blisters, soak your feet in a solution as told by your doctor. Do this for 20 to 30 minutes, 2 times a day. Dry your feet well after you soak them.  Do not share towels.  Wear sandals when you use shared locker rooms or showers. GET HELP RIGHT AWAY IF:   You have a fever.  Your foot is puffy (swollen), sore, warm, or red.  You are not getting better after 7 days of treatment.  You still have athlete's foot after 30 days.  You have problems caused by your medicine. MAKE SURE YOU:   Understand these instructions.  Will watch your condition.  Will get help right away if you are not doing well or get worse. Document Released: 04/01/2008 Document Revised: 01/06/2012 Document Reviewed: 08/02/2011 [facility] Patient Information 2015 [place], [place]. This information is not intended to replace advice given to you by your health care provider. Make sure you discuss any questions you have with your health care provider.

## 2015-02-08 NOTE — Progress Notes (Signed)
   Subjective:    Patient ID: Grace Barajas, female    DOB: Mar 07, 1966, 49 y.o.   MRN: [id number]  HPI 49 y/o female presents with history of tinea pedis that has attempted to be treated with prescription creams without relief. No history of liver disease or hepatitis.     Review of Systems  Skin: Positive for rash (on both feet, pruritic ).       Objective:   Physical Exam  Skin:  Cracking and scaling on plantar surface and between toes of bilateral feet.           Assessment & Plan:  1. Tinea Pedis: Terbinafine 250mg  q day with food x 14 days. RTC if s/s recur or do not improve. Santitize shoes and shower.

## 2015-04-05 ENCOUNTER — Ambulatory Visit: Payer: Self-pay | Admitting: Pulmonary Disease

## 2015-04-26 ENCOUNTER — Ambulatory Visit: Payer: Self-pay | Admitting: Internal Medicine

## 2015-06-26 ENCOUNTER — Other Ambulatory Visit: Payer: Self-pay | Admitting: Family

## 2015-06-26 ENCOUNTER — Ambulatory Visit: Payer: Self-pay | Admitting: Internal Medicine

## 2015-06-27 ENCOUNTER — Encounter: Payer: Self-pay | Admitting: Pulmonary Disease

## 2015-06-27 ENCOUNTER — Ambulatory Visit (INDEPENDENT_AMBULATORY_CARE_PROVIDER_SITE_OTHER): Payer: Medicare Other | Admitting: Pulmonary Disease

## 2015-06-27 VITALS — BP 120/72 | HR 87 | Ht 65.5 in | Wt 203.2 lb

## 2015-06-27 DIAGNOSIS — G4733 Obstructive sleep apnea (adult) (pediatric): Secondary | ICD-10-CM | POA: Diagnosis not present

## 2015-06-27 NOTE — Progress Notes (Signed)
   Subjective:    Patient ID: Grace Barajas, female    DOB: 1966/07/09, 49 y.o.   MRN: [id number]  HPI  Chief Complaint  Patient presents with  . Sleep Apnea    Former [facility] Patient; Patient was using CPAP machine, but machine was taken from her due to non-compliance.  Pt felt like she was suffocating on machine.  Discuss sleep apnea treatment options    For Fu of OSA    NPSG 06/2014:  AHI 15/hr, desat 89%  CPAP did help - nasal mask bothered her, preferred FF mask 12/2014 Sinus issues -could not use CPAP - was discontinued due to poor usage Now she is ready to try again - feels tired & sleepy Snoring & excessive somnolence  Review of Systems neg for any significant sore throat, dysphagia, itching, sneezing, nasal congestion or excess/ purulent secretions, fever, chills, sweats, unintended wt loss, pleuritic or exertional cp, hempoptysis, orthopnea pnd or change in chronic leg swelling. Also denies presyncope, palpitations, heartburn, abdominal pain, nausea, vomiting, diarrhea or change in bowel or urinary habits, dysuria,hematuria, rash, arthralgias, visual complaints, headache, numbness weakness or ataxia.     Objective:   Physical Exam  Gen. Pleasant, obese, in no distress ENT - no lesions, no post nasal drip Neck: No JVD, no thyromegaly, no carotid bruits Lungs: no use of accessory muscles, no dullness to percussion, decreased without rales or rhonchi  Cardiovascular: Rhythm regular, heart sounds  normal, no murmurs or gallops, no peripheral edema Musculoskeletal: No deformities, no cyanosis or clubbing , no tremors       Assessment & Plan:

## 2015-06-27 NOTE — Assessment & Plan Note (Signed)
We will trial autoCPAP again Compliance of at least 6h every night would be expected You may need another home sleep study  Weight loss encouraged, compliance with goal of at least 4-6 hrs every night is the expectation. Advised against medications with sedative side effects Cautioned against driving when sleepy - understanding that sleepiness will vary on a day to day basis

## 2015-06-27 NOTE — Patient Instructions (Signed)
We will trial autoCPAP again Compliance of at least 6h every night would be expected You may need another home sleep study

## 2015-07-04 ENCOUNTER — Telehealth: Payer: Self-pay | Admitting: Pulmonary Disease

## 2015-07-04 DIAGNOSIS — G4733 Obstructive sleep apnea (adult) (pediatric): Secondary | ICD-10-CM

## 2015-07-04 NOTE — Telephone Encounter (Signed)
DME: [facility] Patient notified that she will need HST Ordered  HST

## 2015-07-04 NOTE — Telephone Encounter (Signed)
Okay to order home sleep study 

## 2015-07-04 NOTE — Telephone Encounter (Signed)
Spoke with Arbie Cookey at [place], states that pt was noncompliant with previous sleep study and [employer] will not pay for another cpap machine without a new sleep study.  Last sleep study is from 07/22/2014 at [facility].   Dr. Elsworth Soho please advise on how to proceed.  Thanks!

## 2015-07-17 ENCOUNTER — Telehealth: Payer: Self-pay | Admitting: Family

## 2015-07-17 DIAGNOSIS — G4733 Obstructive sleep apnea (adult) (pediatric): Secondary | ICD-10-CM | POA: Diagnosis not present

## 2015-07-18 DIAGNOSIS — G4733 Obstructive sleep apnea (adult) (pediatric): Secondary | ICD-10-CM | POA: Diagnosis not present

## 2015-07-19 ENCOUNTER — Encounter: Payer: Self-pay | Admitting: Pulmonary Disease

## 2015-07-21 ENCOUNTER — Telehealth: Payer: Self-pay | Admitting: Pulmonary Disease

## 2015-07-21 ENCOUNTER — Other Ambulatory Visit: Payer: Self-pay | Admitting: *Deleted

## 2015-07-21 DIAGNOSIS — G4733 Obstructive sleep apnea (adult) (pediatric): Secondary | ICD-10-CM

## 2015-07-21 NOTE — Telephone Encounter (Signed)
Lmtcb. Dr. Elsworth Soho, patient's sleep study has been scanned. Does patient need CPAP Titration?   Please advise.

## 2015-07-21 NOTE — Telephone Encounter (Signed)
[phone] callin  back

## 2015-07-21 NOTE — Telephone Encounter (Signed)
Pt is aware we are awaiting RA response about sleep study results. Please advise thanks

## 2015-07-22 NOTE — Telephone Encounter (Signed)
AHI 16/h Proceed with autoCPAP 5-12 cm, mask of choice, download in 4 wks OV with TP/me in 6 wks

## 2015-07-24 NOTE — Telephone Encounter (Signed)
Spoke with pt. She is aware of her sleep study results. Order will be placed for CPAP. ROV has been scheduled for 09/04/15.

## 2015-07-27 ENCOUNTER — Telehealth: Payer: Self-pay | Admitting: Pulmonary Disease

## 2015-07-27 NOTE — Telephone Encounter (Signed)
Order was placed on 07/24/15 for this. Arbie Cookey from [place] reports her Grace Barajas is still pending and will contact pt once it goes through. Pt aware and nothing further needed

## 2015-08-21 ENCOUNTER — Telehealth: Payer: Self-pay | Admitting: Pulmonary Disease

## 2015-08-21 NOTE — Telephone Encounter (Signed)
FYI for RA   

## 2015-08-23 DIAGNOSIS — G4733 Obstructive sleep apnea (adult) (pediatric): Secondary | ICD-10-CM | POA: Diagnosis not present

## 2015-08-23 DIAGNOSIS — R0683 Snoring: Secondary | ICD-10-CM | POA: Diagnosis not present

## 2015-09-04 ENCOUNTER — Encounter: Payer: Self-pay | Admitting: Adult Health

## 2015-09-04 ENCOUNTER — Ambulatory Visit (INDEPENDENT_AMBULATORY_CARE_PROVIDER_SITE_OTHER): Payer: Medicare Other | Admitting: Adult Health

## 2015-09-04 VITALS — BP 114/78 | HR 60 | Temp 98.8°F | Ht 65.0 in | Wt 212.0 lb

## 2015-09-04 DIAGNOSIS — J309 Allergic rhinitis, unspecified: Secondary | ICD-10-CM | POA: Diagnosis not present

## 2015-09-04 DIAGNOSIS — G4733 Obstructive sleep apnea (adult) (pediatric): Secondary | ICD-10-CM

## 2015-09-04 NOTE — Assessment & Plan Note (Signed)
Saline nasal rinses  Cont on Zyrtec daily

## 2015-09-04 NOTE — Assessment & Plan Note (Signed)
Wear CPAP At bedtime  .  Goal is 6hr each night  Do not drive if sleepy.  Work on weight loss.  Saline nasal spray As needed   Follow up Dr. Elsworth Soho  In 3 months and As needed

## 2015-09-04 NOTE — Patient Instructions (Signed)
Wear CPAP At bedtime  .  Goal is 6hr each night  Do not drive if sleepy.  Work on weight loss.  Saline nasal spray As needed   Follow up Dr. Elsworth Soho  In 3 months and As needed

## 2015-09-04 NOTE — Progress Notes (Signed)
Subjective:    Patient ID: Grace Barajas, female    DOB: [birth year], 49 y.o.   MRN: [id number]  HPI 49 yo with OSA   TEST  NPSG 06/2014: AHI 15/hr, desat 89% 06/2015 AHI 16   09/04/2015 Follow up : OSA  Pt returns for follow up for sleep apnea.  Previously dx with moderate OSA but could not tolerated CPAP mask.  She underwent new study in September w/ AHI at 16 .  She was restarted on CPAP , began it 1 week ago.  Download for 10/26 to 11/3 with avg usage 4hr , wore every night for 1 week.  AHI 0.9.  Says she is doing well. Feels nasal pillows work better.  Does struggle with chronic sinus congestion.  Takes zyrtec most days.  Feels more rested last few days .     Past Medical History  Diagnosis Date  . Sinus disorder   . Anxiety   . Depression   . Wears glasses   . Snores   . High cholesterol    Current Outpatient Prescriptions on File Prior to Visit  Medication Sig Dispense Refill  . Biotin 5 MG TABS Take by mouth.    . ferrous sulfate 325 (65 FE) MG tablet Take 325 mg by mouth daily with breakfast.    . FLUoxetine (PROZAC) 10 MG capsule Take 40 mg by mouth daily.     . Multiple Vitamins-Minerals (MULTIVITAMIN WITH MINERALS) tablet Take 1 tablet by mouth daily.    . Omega-3 Fatty Acids (FISH OIL) 1000 MG CAPS Take 1 capsule by mouth daily.     Marland Kitchen perphenazine (TRILAFON) 4 MG tablet Take 4 mg by mouth at bedtime.    Marland Kitchen tiZANidine (ZANAFLEX) 4 MG tablet Take 4 mg by mouth every 6 (six) hours as needed for muscle spasms.     . Vitamin D, Ergocalciferol, (DRISDOL) 50000 UNITS CAPS capsule TAKE 1 CAPSULES EVERY 7 DAYS 12 capsule 0  . atorvastatin (LIPITOR) 40 MG tablet Take 40 mg by mouth daily.     . busPIRone (BUSPAR) 5 MG tablet Take by mouth.    . nystatin-triamcinolone ointment (MYCOLOG) Apply 1 application topically 2 (two) times daily. (Patient not taking: Reported on 06/27/2015) 30 g 0  . terbinafine (LAMISIL) 250 MG tablet Take 1 tablet (250 mg total) by mouth  daily. (Patient not taking: Reported on 06/27/2015) 14 tablet 0   No current facility-administered medications on file prior to visit.     Review of Systems  Constitutional:   No  weight loss, night sweats,  Fevers, chills, fatigue, or  lassitude.  HEENT:   No headaches,  Difficulty swallowing,  Tooth/dental problems, or  Sore throat,                No sneezing, itching, ear ache,  +nasal congestion, post nasal drip,   CV:  No chest pain,  Orthopnea, PND, swelling in lower extremities, anasarca, dizziness, palpitations, syncope.   GI  No heartburn, indigestion, abdominal pain, nausea, vomiting, diarrhea, change in bowel habits, loss of appetite, bloody stools.   Resp: No shortness of breath with exertion or at rest.  No excess mucus, no productive cough,  No non-productive cough,  No coughing up of blood.  No change in color of mucus.  No wheezing.  No chest wall deformity  Skin: no rash or lesions.  GU: no dysuria, change in color of urine, no urgency or frequency.  No flank pain, no hematuria   MS:  No joint pain or swelling.  No decreased range of motion.  No back pain.  Psych:  No change in mood or affect. No depression or anxiety.  No memory loss.          Objective:   Physical Exam GEN: A/Ox3; pleasant , NAD, obese   HEENT:  [place]/AT,  EACs-clear, TMs-wnl, NOSE-clear drainage , THROAT-clear, no lesions, no postnasal drip or exudate noted.   NECK:  Supple w/ fair ROM; no JVD; normal carotid impulses w/o bruits; no thyromegaly or nodules palpated; no lymphadenopathy.  RESP  Clear  P & A; w/o, wheezes/ rales/ or rhonchi.no accessory muscle use, no dullness to percussion  CARD:  RRR, no m/r/g  , no peripheral edema, pulses intact, no cyanosis or clubbing.  GI:   Soft & nt; nml bowel sounds; no organomegaly or masses detected.  Musco: Warm bil, no deformities or joint swelling noted.   Neuro: alert, no focal deficits noted.    Skin: Warm, no lesions or  rashes         Assessment & Plan:

## 2015-09-06 NOTE — Progress Notes (Signed)
Reviewed & agree with plan  

## 2015-09-23 DIAGNOSIS — R0683 Snoring: Secondary | ICD-10-CM | POA: Diagnosis not present

## 2015-09-23 DIAGNOSIS — G4733 Obstructive sleep apnea (adult) (pediatric): Secondary | ICD-10-CM | POA: Diagnosis not present

## 2015-09-27 ENCOUNTER — Encounter: Payer: Self-pay | Admitting: Adult Health

## 2015-10-13 ENCOUNTER — Telehealth: Payer: Self-pay | Admitting: Pulmonary Disease

## 2015-10-13 DIAGNOSIS — G4733 Obstructive sleep apnea (adult) (pediatric): Secondary | ICD-10-CM

## 2015-10-13 NOTE — Telephone Encounter (Signed)
Pt saw TP 11/7. Pt does not feel like her pressure is high enough. She wants [place] to look at her download. I have printed this off from [facility] and will have TP review. Please advise thanks

## 2015-10-13 NOTE — Telephone Encounter (Signed)
Called spoke with pt and is aware of below. Nothing further needed

## 2015-10-13 NOTE — Telephone Encounter (Signed)
Control is good  Download 11/16-12/15/16 with AHI 2.5, min leaks.  Auto set 5-15. Avg pressure 10 .  Can increase pressure to Auto set 8-15cmH2O .

## 2015-10-23 DIAGNOSIS — R0683 Snoring: Secondary | ICD-10-CM | POA: Diagnosis not present

## 2015-10-23 DIAGNOSIS — G4733 Obstructive sleep apnea (adult) (pediatric): Secondary | ICD-10-CM | POA: Diagnosis not present

## 2015-11-08 DIAGNOSIS — R0683 Snoring: Secondary | ICD-10-CM | POA: Diagnosis not present

## 2015-11-08 DIAGNOSIS — G4733 Obstructive sleep apnea (adult) (pediatric): Secondary | ICD-10-CM | POA: Diagnosis not present

## 2015-11-23 ENCOUNTER — Encounter: Payer: Self-pay | Admitting: Adult Health

## 2015-11-23 DIAGNOSIS — R0683 Snoring: Secondary | ICD-10-CM | POA: Diagnosis not present

## 2015-11-23 DIAGNOSIS — G4733 Obstructive sleep apnea (adult) (pediatric): Secondary | ICD-10-CM | POA: Diagnosis not present

## 2015-12-11 DIAGNOSIS — R0683 Snoring: Secondary | ICD-10-CM | POA: Diagnosis not present

## 2015-12-11 DIAGNOSIS — G4733 Obstructive sleep apnea (adult) (pediatric): Secondary | ICD-10-CM | POA: Diagnosis not present

## 2015-12-15 DIAGNOSIS — F251 Schizoaffective disorder, depressive type: Secondary | ICD-10-CM | POA: Diagnosis not present

## 2015-12-27 ENCOUNTER — Ambulatory Visit (INDEPENDENT_AMBULATORY_CARE_PROVIDER_SITE_OTHER): Payer: Medicare Other | Admitting: Pulmonary Disease

## 2015-12-27 ENCOUNTER — Encounter: Payer: Self-pay | Admitting: Pulmonary Disease

## 2015-12-27 VITALS — BP 110/80 | HR 93 | Ht 65.5 in | Wt 212.4 lb

## 2015-12-27 DIAGNOSIS — G4733 Obstructive sleep apnea (adult) (pediatric): Secondary | ICD-10-CM | POA: Diagnosis not present

## 2015-12-27 DIAGNOSIS — J3089 Other allergic rhinitis: Secondary | ICD-10-CM | POA: Diagnosis not present

## 2015-12-27 NOTE — Progress Notes (Signed)
   Subjective:    Patient ID: Grace Barajas, female    DOB: 13-May-1966, 50 y.o.   MRN: [id number]  HPI  50 yo with OSA     12/27/2015  Chief Complaint  Patient presents with  . Follow-up    doing well on cpap, has sinus infection right now; mask will leak at times.  patient says that she breaths very hard during the day, feels fatigued all the time.      She underwent new study in September w/ AHI at 16 .  CPAP was initiated in 07/2016 [facility] 11/2015 - good usage 5h, no residuals, no leak , avg pr 10 cm on auto 8-15  Nasal pillows ok, pr ok, no dryness Wt unchanged  Does struggle with chronic sinus congestion.    TEST  NPSG 06/2014: AHI 15/hr, desat 89% 06/2015 AHI 16    Review of Systems Patient denies significant dyspnea,cough, hemoptysis,  chest pain, palpitations, pedal edema, orthopnea, paroxysmal nocturnal dyspnea, lightheadedness, nausea, vomiting, abdominal or  leg pains      Objective:   Physical Exam  Gen. Pleasant, obese, in no distress ENT - no lesions, no post nasal drip Neck: No JVD, no thyromegaly, no carotid bruits Lungs: no use of accessory muscles, no dullness to percussion, decreased without rales or rhonchi  Cardiovascular: Rhythm regular, heart sounds  normal, no murmurs or gallops, no peripheral edema Musculoskeletal: No deformities, no cyanosis or clubbing , no tremors        Assessment & Plan:

## 2015-12-27 NOTE — Assessment & Plan Note (Signed)
You are on auto CPAP settings - avg pressure is 10 cm CPAP supplies will be renewed x 1 year  Weight loss encouraged, compliance with goal of at least 4-6 hrs every night is the expectation. Advised against medications with sedative side effects Cautioned against driving when sleepy - understanding that sleepiness will vary on a day to day basis

## 2015-12-27 NOTE — Assessment & Plan Note (Signed)
Start on zyrtec daily

## 2015-12-27 NOTE — Patient Instructions (Signed)
You are on auto CPAP settings - avg pressure is 10 cm Start on zyrtec daily CPAP supplies will be renewed x 1 year

## 2016-01-08 DIAGNOSIS — G4733 Obstructive sleep apnea (adult) (pediatric): Secondary | ICD-10-CM | POA: Diagnosis not present

## 2016-01-08 DIAGNOSIS — R0683 Snoring: Secondary | ICD-10-CM | POA: Diagnosis not present

## 2016-01-12 DIAGNOSIS — L720 Epidermal cyst: Secondary | ICD-10-CM | POA: Diagnosis not present

## 2016-01-12 DIAGNOSIS — L700 Acne vulgaris: Secondary | ICD-10-CM | POA: Diagnosis not present

## 2016-01-17 ENCOUNTER — Other Ambulatory Visit: Payer: Self-pay | Admitting: Family

## 2016-01-17 ENCOUNTER — Encounter: Payer: Self-pay | Admitting: Pulmonary Disease

## 2016-01-18 ENCOUNTER — Ambulatory Visit: Payer: Self-pay | Admitting: Pulmonary Disease

## 2016-01-18 NOTE — Telephone Encounter (Signed)
Last seen 02/08/15 Tiffany  Last Vit D 05/23/14  24.9

## 2016-01-23 ENCOUNTER — Encounter: Payer: Self-pay | Admitting: Family

## 2016-01-23 ENCOUNTER — Ambulatory Visit (INDEPENDENT_AMBULATORY_CARE_PROVIDER_SITE_OTHER): Payer: Medicare Other | Admitting: Family

## 2016-01-23 VITALS — BP 116/82 | HR 65 | Temp 98.3°F | Ht 65.5 in | Wt 212.0 lb

## 2016-01-23 DIAGNOSIS — E785 Hyperlipidemia, unspecified: Secondary | ICD-10-CM | POA: Insufficient documentation

## 2016-01-23 DIAGNOSIS — Z0000 Encounter for general adult medical examination without abnormal findings: Secondary | ICD-10-CM

## 2016-01-23 DIAGNOSIS — Z1211 Encounter for screening for malignant neoplasm of colon: Secondary | ICD-10-CM

## 2016-01-23 DIAGNOSIS — Z01419 Encounter for gynecological examination (general) (routine) without abnormal findings: Secondary | ICD-10-CM

## 2016-01-23 DIAGNOSIS — Z1239 Encounter for other screening for malignant neoplasm of breast: Secondary | ICD-10-CM

## 2016-01-23 DIAGNOSIS — J3089 Other allergic rhinitis: Secondary | ICD-10-CM | POA: Diagnosis not present

## 2016-01-23 DIAGNOSIS — F411 Generalized anxiety disorder: Secondary | ICD-10-CM

## 2016-01-23 DIAGNOSIS — E669 Obesity, unspecified: Secondary | ICD-10-CM | POA: Insufficient documentation

## 2016-01-23 DIAGNOSIS — E781 Pure hyperglyceridemia: Secondary | ICD-10-CM

## 2016-01-23 DIAGNOSIS — F329 Major depressive disorder, single episode, unspecified: Secondary | ICD-10-CM

## 2016-01-23 DIAGNOSIS — F32A Depression, unspecified: Secondary | ICD-10-CM

## 2016-01-23 DIAGNOSIS — G4733 Obstructive sleep apnea (adult) (pediatric): Secondary | ICD-10-CM | POA: Diagnosis not present

## 2016-01-23 LAB — MICROSCOPIC EXAMINATION

## 2016-01-23 LAB — URINALYSIS, COMPLETE
Bilirubin, UA: NEGATIVE
Glucose, UA: NEGATIVE
KETONES UA: NEGATIVE
Leukocytes, UA: NEGATIVE
NITRITE UA: NEGATIVE
PH UA: 5.5 (ref 5.0–7.5)
Protein, UA: NEGATIVE
Specific Gravity, UA: 1.015 (ref 1.005–1.030)
Urobilinogen, Ur: 0.2 mg/dL (ref 0.2–1.0)

## 2016-01-23 NOTE — Patient Instructions (Signed)
Health Maintenance, Female Adopting a healthy lifestyle and getting preventive care can go a long way to promote health and wellness. Talk with your health care provider about what schedule of regular examinations is right for you. This is a good chance for you to check in with your provider about disease prevention and staying healthy. In between checkups, there are plenty of things you can do on your own. Experts have done a lot of research about which lifestyle changes and preventive measures are most likely to keep you healthy. Ask your health care provider for more information. WEIGHT AND DIET  Eat a healthy diet  Be sure to include plenty of vegetables, fruits, low-fat dairy products, and lean protein.  Do not eat a lot of foods high in solid fats, added sugars, or salt.  Get regular exercise. This is one of the most important things you can do for your health.  Most adults should exercise for at least 150 minutes each week. The exercise should increase your heart rate and make you sweat (moderate-intensity exercise).  Most adults should also do strengthening exercises at least twice a week. This is in addition to the moderate-intensity exercise.  Maintain a healthy weight  Body mass index (BMI) is a measurement that can be used to identify possible weight problems. It estimates body fat based on height and weight. Your health care provider can help determine your BMI and help you achieve or maintain a healthy weight.  For females 20 years of age and older:   A BMI below 18.5 is considered underweight.  A BMI of 18.5 to 24.9 is normal.  A BMI of 25 to 29.9 is considered overweight.  A BMI of 30 and above is considered obese.  Watch levels of cholesterol and blood lipids  You should start having your blood tested for lipids and cholesterol at 51 years of age, then have this test every 5 years.  You may need to have your cholesterol levels checked more often if:  Your lipid  or cholesterol levels are high.  You are older than 50 years of age.  You are at high risk for heart disease.  CANCER SCREENING   Lung Cancer  Lung cancer screening is recommended for adults 55-80 years old who are at high risk for lung cancer because of a history of smoking.  A yearly low-dose CT scan of the lungs is recommended for people who:  Currently smoke.  Have quit within the past 15 years.  Have at least a 30-pack-year history of smoking. A pack year is smoking an average of one pack of cigarettes a day for 1 year.  Yearly screening should continue until it has been 15 years since you quit.  Yearly screening should stop if you develop a health problem that would prevent you from having lung cancer treatment.  Breast Cancer  Practice breast self-awareness. This means understanding how your breasts normally appear and feel.  It also means doing regular breast self-exams. Let your health care provider know about any changes, no matter how small.  If you are in your 20s or 30s, you should have a clinical breast exam (CBE) by a health care provider every 1-3 years as part of a regular health exam.  If you are 40 or older, have a CBE every year. Also consider having a breast X-ray (mammogram) every year.  If you have a family history of breast cancer, talk to your health care provider about genetic screening.  If you   are at high risk for breast cancer, talk to your health care provider about having an MRI and a mammogram every year.  Breast cancer gene (BRCA) assessment is recommended for women who have family members with BRCA-related cancers. BRCA-related cancers include:  Breast.  Ovarian.  Tubal.  Peritoneal cancers.  Results of the assessment will determine the need for genetic counseling and BRCA1 and BRCA2 testing. Cervical Cancer Your health care provider may recommend that you be screened regularly for cancer of the pelvic organs (ovaries, uterus, and  vagina). This screening involves a pelvic examination, including checking for microscopic changes to the surface of your cervix (Pap test). You may be encouraged to have this screening done every 3 years, beginning at age 21.  For women ages 30-65, health care providers may recommend pelvic exams and Pap testing every 3 years, or they may recommend the Pap and pelvic exam, combined with testing for human papilloma virus (HPV), every 5 years. Some types of HPV increase your risk of cervical cancer. Testing for HPV may also be done on women of any age with unclear Pap test results.  Other health care providers may not recommend any screening for nonpregnant women who are considered low risk for pelvic cancer and who do not have symptoms. Ask your health care provider if a screening pelvic exam is right for you.  If you have had past treatment for cervical cancer or a condition that could lead to cancer, you need Pap tests and screening for cancer for at least 20 years after your treatment. If Pap tests have been discontinued, your risk factors (such as having a new sexual partner) need to be reassessed to determine if screening should resume. Some women have medical problems that increase the chance of getting cervical cancer. In these cases, your health care provider may recommend more frequent screening and Pap tests. Colorectal Cancer  This type of cancer can be detected and often prevented.  Routine colorectal cancer screening usually begins at 50 years of age and continues through 51 years of age.  Your health care provider may recommend screening at an earlier age if you have risk factors for colon cancer.  Your health care provider may also recommend using home test kits to check for hidden blood in the stool.  A small camera at the end of a tube can be used to examine your colon directly (sigmoidoscopy or colonoscopy). This is done to check for the earliest forms of colorectal  cancer.  Routine screening usually begins at age 50.  Direct examination of the colon should be repeated every 5-10 years through 51 years of age. However, you may need to be screened more often if early forms of precancerous polyps or small growths are found. Skin Cancer  Check your skin from head to toe regularly.  Tell your health care provider about any new moles or changes in moles, especially if there is a change in a mole's shape or color.  Also tell your health care provider if you have a mole that is larger than the size of a pencil eraser.  Always use sunscreen. Apply sunscreen liberally and repeatedly throughout the day.  Protect yourself by wearing long sleeves, pants, a wide-brimmed hat, and sunglasses whenever you are outside. HEART DISEASE, DIABETES, AND HIGH BLOOD PRESSURE   High blood pressure causes heart disease and increases the risk of stroke. High blood pressure is more likely to develop in:  People who have blood pressure in the high end   of the normal range (130-139/85-89 mm Hg).  People who are overweight or obese.  People who are African American.  If you are 38-23 years of age, have your blood pressure checked every 3-5 years. If you are 61 years of age or older, have your blood pressure checked every year. You should have your blood pressure measured twice--once when you are at a hospital or clinic, and once when you are not at a hospital or clinic. Record the average of the two measurements. To check your blood pressure when you are not at a hospital or clinic, you can use:  An automated blood pressure machine at a pharmacy.  A home blood pressure monitor.  If you are between 45 years and 39 years old, ask your health care provider if you should take aspirin to prevent strokes.  Have regular diabetes screenings. This involves taking a blood sample to check your fasting blood sugar level.  If you are at a normal weight and have a low risk for diabetes,  have this test once every three years after 51 years of age.  If you are overweight and have a high risk for diabetes, consider being tested at a younger age or more often. PREVENTING INFECTION  Hepatitis B  If you have a higher risk for hepatitis B, you should be screened for this virus. You are considered at high risk for hepatitis B if:  You were born in a country where hepatitis B is common. Ask your health care provider which countries are considered high risk.  Your parents were born in a high-risk country, and you have not been immunized against hepatitis B (hepatitis B vaccine).  You have HIV or AIDS.  You use needles to inject street drugs.  You live with someone who has hepatitis B.  You have had sex with someone who has hepatitis B.  You get hemodialysis treatment.  You take certain medicines for conditions, including cancer, organ transplantation, and autoimmune conditions. Hepatitis C  Blood testing is recommended for:  Everyone born from [birth year] through 1966.  Anyone with known risk factors for hepatitis C. Sexually transmitted infections (STIs)  You should be screened for sexually transmitted infections (STIs) including gonorrhea and chlamydia if:  You are sexually active and are younger than 51 years of age.  You are older than 51 years of age and your health care provider tells you that you are at risk for this type of infection.  Your sexual activity has changed since you were last screened and you are at an increased risk for chlamydia or gonorrhea. Ask your health care provider if you are at risk.  If you do not have HIV, but are at risk, it may be recommended that you take a prescription medicine daily to prevent HIV infection. This is called pre-exposure prophylaxis (PrEP). You are considered at risk if:  You are sexually active and do not regularly use condoms or know the HIV status of your partner(s).  You take drugs by injection.  You are sexually  active with a partner who has HIV. Talk with your health care provider about whether you are at high risk of being infected with HIV. If you choose to begin PrEP, you should first be tested for HIV. You should then be tested every 3 months for as long as you are taking PrEP.  PREGNANCY   If you are premenopausal and you may become pregnant, ask your health care provider about preconception counseling.  If you may  become pregnant, take 400 to 800 micrograms (mcg) of folic acid every day.  If you want to prevent pregnancy, talk to your health care provider about birth control (contraception). OSTEOPOROSIS AND MENOPAUSE   Osteoporosis is a disease in which the bones lose minerals and strength with aging. This can result in serious bone fractures. Your risk for osteoporosis can be identified using a bone density scan.  If you are 61 years of age or older, or if you are at risk for osteoporosis and fractures, ask your health care provider if you should be screened.  Ask your health care provider whether you should take a calcium or vitamin D supplement to lower your risk for osteoporosis.  Menopause may have certain physical symptoms and risks.  Hormone replacement therapy may reduce some of these symptoms and risks. Talk to your health care provider about whether hormone replacement therapy is right for you.  HOME CARE INSTRUCTIONS   Schedule regular health, dental, and eye exams.  Stay current with your immunizations.   Do not use any tobacco products including cigarettes, chewing tobacco, or electronic cigarettes.  If you are pregnant, do not drink alcohol.  If you are breastfeeding, limit how much and how often you drink alcohol.  Limit alcohol intake to no more than 1 drink per day for nonpregnant women. One drink equals 12 ounces of beer, 5 ounces of wine, or 1 ounces of hard liquor.  Do not use street drugs.  Do not share needles.  Ask your health care provider for help if  you need support or information about quitting drugs.  Tell your health care provider if you often feel depressed.  Tell your health care provider if you have ever been abused or do not feel safe at home.   This information is not intended to replace advice given to you by your health care provider. Make sure you discuss any questions you have with your health care provider.   Document Released: 04/29/2011 Document Revised: 11/04/2014 Document Reviewed: 09/15/2013 Elsevier Interactive Patient Education [employer].

## 2016-01-23 NOTE — Progress Notes (Addendum)
Subjective:    Patient ID: Grace Barajas, female    DOB: 1965-12-15, 50 y.o.   MRN: [id number]  Pt presents to office for annual with pap. Pt goes to behavior health once a month who manages her depression, GAD, schizo-affective, and Insomnia.  Gynecologic Exam  Anxiety Presents for follow-up visit. Onset was 1 to 5 years ago. The problem has been waxing and waning. Patient reports no excessive worry, insomnia, irritability, nervous/anxious behavior, palpitations or shortness of breath. Symptoms occur rarely. The severity of symptoms is mild. The quality of sleep is good.   Her past medical history is significant for anxiety/panic attacks and depression. Past treatments include SSRIs.  Hyperlipidemia This is a chronic problem. The current episode started more than 1 year ago. The problem is uncontrolled. Recent lipid tests were reviewed and are high. Pertinent negatives include no shortness of breath. Current antihyperlipidemic treatment includes diet change. The current treatment provides mild improvement of lipids. Risk factors for coronary artery disease include dyslipidemia, family history, obesity and post-menopausal.  OSA Pt states she is using her CPAP about 5-6 nights a week. PT reports still feeling fatigued and tired, but states it has "helped a little".     Review of Systems  Constitutional: Negative.  Negative for irritability.  HENT: Negative.   Eyes: Negative.   Respiratory: Negative.  Negative for shortness of breath.   Cardiovascular: Negative.  Negative for palpitations.  Gastrointestinal: Negative.   Endocrine: Negative.   Genitourinary: Negative.   Neurological: Negative.   Hematological: Negative.   Psychiatric/Behavioral: Negative.  The patient is not nervous/anxious and does not have insomnia.   All other systems reviewed and are negative.      Objective:   Physical Exam  Constitutional: She is oriented to person, place, and time. She appears  well-developed and well-nourished. No distress.  HENT:  Head: Normocephalic and atraumatic.  Right Ear: External ear normal.  Left Ear: External ear normal.  Nose: Nose normal.  Mouth/Throat: Oropharynx is clear and moist.  Eyes: Pupils are equal, round, and reactive to light.  Neck: Normal range of motion. Neck supple. No thyromegaly present.  Cardiovascular: Normal rate, regular rhythm, normal heart sounds and intact distal pulses.   No murmur heard. Pulmonary/Chest: Effort normal and breath sounds normal. No respiratory distress. She has no wheezes. Right breast exhibits no inverted nipple, no mass, no nipple discharge, no skin change and no tenderness. Left breast exhibits no inverted nipple, no mass, no nipple discharge, no skin change and no tenderness. Breasts are symmetrical.  Abdominal: Soft. Bowel sounds are normal. She exhibits no distension. There is no tenderness.  Genitourinary: Vagina normal and uterus normal. No vaginal discharge found.  Bimanual exam- no adnexal masses or tenderness, ovaries nonpalpable     Musculoskeletal: Normal range of motion. She exhibits no edema or tenderness.  Lymphadenopathy:    She has cervical adenopathy.  Neurological: She is alert and oriented to person, place, and time. She has normal reflexes. No cranial nerve deficit.  Skin: Skin is warm and dry.  Psychiatric: She has a normal mood and affect. Her behavior is normal. Judgment and thought content normal.  Vitals reviewed.   BP 116/82 mmHg  Pulse 65  Temp(Src) 98.3 F (36.8 C) (Oral)  Ht 5' 5.5" (1.664 m)  Wt 212 lb (96.163 kg)  BMI 34.73 kg/m2  LMP 01/11/2013       Assessment & Plan:  1. Encounter for routine gynecological examination - Urinalysis, Complete - CMP14+EGFR -  Pap IG, CT/NG w/ reflex HPV when ASC-U  2. Obesity (BMI 30.0-34.9) - CMP14+EGFR  3. OSA (obstructive sleep apnea) - CMP14+EGFR  4. Other allergic rhinitis - CMP14+EGFR  5. Depression -  CMP14+EGFR  6. GAD (generalized anxiety disorder) - CMP14+EGFR  7. Hypertriglyceridemia - CMP14+EGFR  8. Colon cancer screening - CMP14+EGFR - Ambulatory referral to Gastroenterology  9. Breast cancer screening - CMP14+EGFR - [facility] MAMMOGRAPHY  10. Annual physical exam - CMP14+EGFR - Anemia Profile B - Lipid panel - Thyroid Panel With TSH - VITAMIN D 25 Hydroxy (Vit-D Deficiency, Fractures) - Pap IG, CT/NG w/ reflex HPV when ASC-U   Continue all meds Labs pending Health Maintenance reviewed Diet and exercise encouraged RTO 1 year   Evelina Dun, FNP

## 2016-01-23 NOTE — Addendum Note (Signed)
Addended by: Evelina Dun A on: 01/23/2016 11:17 AM   Modules accepted: SmartSet

## 2016-01-24 LAB — CMP14+EGFR
ALBUMIN: 4.6 g/dL (ref 3.5–5.5)
ALT: 17 IU/L (ref 0–32)
AST: 13 IU/L (ref 0–40)
Albumin/Globulin Ratio: 1.4 (ref 1.2–2.2)
Alkaline Phosphatase: 114 IU/L (ref 39–117)
BUN/Creatinine Ratio: 17 (ref 9–23)
BUN: 11 mg/dL (ref 6–24)
Bilirubin Total: 0.4 mg/dL (ref 0.0–1.2)
CALCIUM: 10.5 mg/dL — AB (ref 8.7–10.2)
CO2: 25 mmol/L (ref 18–29)
CREATININE: 0.65 mg/dL (ref 0.57–1.00)
Chloride: 99 mmol/L (ref 96–106)
GFR calc Af Amer: 120 mL/min/{1.73_m2} (ref >59)
GFR, EST NON AFRICAN AMERICAN: 104 mL/min/{1.73_m2} (ref >59)
GLOBULIN, TOTAL: 3.2 g/dL (ref 1.5–4.5)
Glucose: 106 mg/dL — ABNORMAL HIGH (ref 65–99)
Potassium: 4.2 mmol/L (ref 3.5–5.2)
SODIUM: 143 mmol/L (ref 134–144)
TOTAL PROTEIN: 7.8 g/dL (ref 6.0–8.5)

## 2016-01-24 LAB — THYROID PANEL WITH TSH
FREE THYROXINE INDEX: 2.2 (ref 1.2–4.9)
T3 UPTAKE RATIO: 27 % (ref 24–39)
T4 TOTAL: 8.3 ug/dL (ref 4.5–12.0)
TSH: 3.7 u[IU]/mL (ref 0.450–4.500)

## 2016-01-24 LAB — ANEMIA PROFILE B
BASOS: 0 %
Basophils Absolute: 0 10*3/uL (ref 0.0–0.2)
EOS (ABSOLUTE): 0.2 10*3/uL (ref 0.0–0.4)
EOS: 3 %
FERRITIN: 194 ng/mL — AB (ref 15–150)
HEMOGLOBIN: 12.5 g/dL (ref 11.1–15.9)
Hematocrit: 35.5 % (ref 34.0–46.6)
IRON SATURATION: 28 % (ref 15–55)
Immature Grans (Abs): 0 10*3/uL (ref 0.0–0.1)
Immature Granulocytes: 0 %
Iron: 106 ug/dL (ref 27–159)
LYMPHS ABS: 2.3 10*3/uL (ref 0.7–3.1)
Lymphs: 44 %
MCH: 29.4 pg (ref 26.6–33.0)
MCHC: 35.2 g/dL (ref 31.5–35.7)
MCV: 84 fL (ref 79–97)
MONOS ABS: 0.3 10*3/uL (ref 0.1–0.9)
Monocytes: 6 %
NEUTROS PCT: 47 %
Neutrophils Absolute: 2.5 10*3/uL (ref 1.4–7.0)
Platelets: 425 10*3/uL — ABNORMAL HIGH (ref 150–379)
RBC: 4.25 x10E6/uL (ref 3.77–5.28)
RDW: 14.2 % (ref 12.3–15.4)
Retic Ct Pct: 1.7 % (ref 0.6–2.6)
Total Iron Binding Capacity: 381 ug/dL (ref 250–450)
UIBC: 275 ug/dL (ref 131–425)
Vitamin B-12: 784 pg/mL (ref 211–946)
WBC: 5.2 10*3/uL (ref 3.4–10.8)

## 2016-01-24 LAB — LIPID PANEL
CHOLESTEROL TOTAL: 149 mg/dL (ref 100–199)
Chol/HDL Ratio: 3.6 ratio units (ref 0.0–4.4)
HDL: 41 mg/dL (ref >39)
LDL Calculated: 70 mg/dL (ref 0–99)
Triglycerides: 190 mg/dL — ABNORMAL HIGH (ref 0–149)
VLDL CHOLESTEROL CAL: 38 mg/dL (ref 5–40)

## 2016-01-24 LAB — VITAMIN D 25 HYDROXY (VIT D DEFICIENCY, FRACTURES): VIT D 25 HYDROXY: 32.6 ng/mL (ref 30.0–100.0)

## 2016-01-25 ENCOUNTER — Ambulatory Visit: Payer: Self-pay | Admitting: Pulmonary Disease

## 2016-01-26 ENCOUNTER — Telehealth: Payer: Self-pay | Admitting: *Deleted

## 2016-01-26 ENCOUNTER — Telehealth: Payer: Self-pay | Admitting: Family

## 2016-01-26 LAB — PAP IG, CT-NG, RFX HPV ASCU
CHLAMYDIA, NUC. ACID AMP: NEGATIVE
Gonococcus by Nucleic Acid Amp: NEGATIVE
PAP Smear Comment: 0

## 2016-01-26 NOTE — Telephone Encounter (Signed)
Patient aware of lab results and copy was mailed to her.

## 2016-01-26 NOTE — Telephone Encounter (Signed)
Please review labs. 

## 2016-01-26 NOTE — Telephone Encounter (Signed)
Pap pending. Other labs addressed

## 2016-01-26 NOTE — Progress Notes (Signed)
Patient aware.

## 2016-01-29 ENCOUNTER — Telehealth: Payer: Self-pay | Admitting: Family

## 2016-01-29 NOTE — Telephone Encounter (Signed)
Labs DOS 01-23-16 mailed to patient

## 2016-02-06 DIAGNOSIS — L720 Epidermal cyst: Secondary | ICD-10-CM | POA: Diagnosis not present

## 2016-02-08 DIAGNOSIS — G4733 Obstructive sleep apnea (adult) (pediatric): Secondary | ICD-10-CM | POA: Diagnosis not present

## 2016-02-08 DIAGNOSIS — R0683 Snoring: Secondary | ICD-10-CM | POA: Diagnosis not present

## 2016-03-09 DIAGNOSIS — R0683 Snoring: Secondary | ICD-10-CM | POA: Diagnosis not present

## 2016-03-09 DIAGNOSIS — G4733 Obstructive sleep apnea (adult) (pediatric): Secondary | ICD-10-CM | POA: Diagnosis not present

## 2016-04-09 DIAGNOSIS — G4733 Obstructive sleep apnea (adult) (pediatric): Secondary | ICD-10-CM | POA: Diagnosis not present

## 2016-04-09 DIAGNOSIS — R0683 Snoring: Secondary | ICD-10-CM | POA: Diagnosis not present

## 2016-04-15 DIAGNOSIS — M5442 Lumbago with sciatica, left side: Secondary | ICD-10-CM | POA: Diagnosis not present

## 2016-05-08 ENCOUNTER — Other Ambulatory Visit: Payer: Self-pay | Admitting: Sports Medicine

## 2016-05-08 DIAGNOSIS — M5441 Lumbago with sciatica, right side: Secondary | ICD-10-CM

## 2016-05-08 DIAGNOSIS — M5442 Lumbago with sciatica, left side: Principal | ICD-10-CM

## 2016-05-09 DIAGNOSIS — R0683 Snoring: Secondary | ICD-10-CM | POA: Diagnosis not present

## 2016-05-09 DIAGNOSIS — G4733 Obstructive sleep apnea (adult) (pediatric): Secondary | ICD-10-CM | POA: Diagnosis not present

## 2016-05-18 ENCOUNTER — Ambulatory Visit
Admission: RE | Admit: 2016-05-18 | Discharge: 2016-05-18 | Disposition: A | Discharge status: Home / Self Care | Payer: Self-pay | Source: Ambulatory Visit | Attending: Sports Medicine | Admitting: Sports Medicine

## 2016-05-18 DIAGNOSIS — M5441 Lumbago with sciatica, right side: Secondary | ICD-10-CM

## 2016-05-18 DIAGNOSIS — M5442 Lumbago with sciatica, left side: Principal | ICD-10-CM

## 2016-05-18 DIAGNOSIS — M5126 Other intervertebral disc displacement, lumbar region: Secondary | ICD-10-CM | POA: Diagnosis not present

## 2016-05-30 DIAGNOSIS — M5442 Lumbago with sciatica, left side: Secondary | ICD-10-CM | POA: Diagnosis not present

## 2016-05-30 DIAGNOSIS — S335XXD Sprain of ligaments of lumbar spine, subsequent encounter: Secondary | ICD-10-CM | POA: Diagnosis not present

## 2016-06-09 DIAGNOSIS — R0683 Snoring: Secondary | ICD-10-CM | POA: Diagnosis not present

## 2016-06-09 DIAGNOSIS — G4733 Obstructive sleep apnea (adult) (pediatric): Secondary | ICD-10-CM | POA: Diagnosis not present

## 2016-06-28 DIAGNOSIS — S134XXS Sprain of ligaments of cervical spine, sequela: Secondary | ICD-10-CM | POA: Diagnosis not present

## 2016-06-28 DIAGNOSIS — M5136 Other intervertebral disc degeneration, lumbar region: Secondary | ICD-10-CM | POA: Diagnosis not present

## 2016-09-11 ENCOUNTER — Telehealth: Payer: Self-pay | Admitting: Pulmonary Disease

## 2016-09-11 NOTE — Telephone Encounter (Signed)
Spoke with pt, states that today while running an errand she had a sudden episode of intense SOB, couldn't take a breath in or out.  Pt feels like "her airway is smaller than it should be".    Pt scheduled for an acute ov with RA tomorrow to evaluate this.  Nothing further needed at this time.

## 2016-09-12 ENCOUNTER — Encounter: Payer: Self-pay | Admitting: Pulmonary Disease

## 2016-09-12 ENCOUNTER — Ambulatory Visit (INDEPENDENT_AMBULATORY_CARE_PROVIDER_SITE_OTHER): Payer: Medicare Other | Admitting: Pulmonary Disease

## 2016-09-12 ENCOUNTER — Ambulatory Visit (INDEPENDENT_AMBULATORY_CARE_PROVIDER_SITE_OTHER)
Admission: RE | Admit: 2016-09-12 | Discharge: 2016-09-12 | Disposition: A | Discharge status: Home / Self Care | Payer: Medicare Other | Source: Ambulatory Visit | Attending: Pulmonary Disease | Admitting: Pulmonary Disease

## 2016-09-12 VITALS — BP 110/68 | HR 88 | Ht 65.5 in | Wt 217.8 lb

## 2016-09-12 DIAGNOSIS — G4733 Obstructive sleep apnea (adult) (pediatric): Secondary | ICD-10-CM | POA: Diagnosis not present

## 2016-09-12 DIAGNOSIS — R0602 Shortness of breath: Secondary | ICD-10-CM

## 2016-09-12 DIAGNOSIS — Z23 Encounter for immunization: Secondary | ICD-10-CM

## 2016-09-12 NOTE — Assessment & Plan Note (Signed)
Again, this does not seem like asthma I doubt that she has obstructive lung disease or interstitial lung disease. This could be related to panic attacks or vocal cord dysfunction. She will follow up with PCP and with her psychiatrist We will obtain a chest x-ray for completion today

## 2016-09-12 NOTE — Assessment & Plan Note (Signed)
Continue auto CPAP   Weight loss encouraged, compliance with goal of at least 4-6 hrs every night is the expectation. Advised against medications with sedative side effects Cautioned against driving when sleepy - understanding that sleepiness will vary on a day to day basis

## 2016-09-12 NOTE — Patient Instructions (Signed)
Flu shot today Chest x-ray

## 2016-09-12 NOTE — Progress Notes (Signed)
   Subjective:    Patient ID: Grace Barajas, female    DOB: 1966-01-15, 50 y.o.   MRN: [id number]  HPI  50 yo with Moderate OSA on auto CPAP, average 10 cm   09/12/2016  Chief Complaint  Patient presents with  . Acute Visit    Pt c/o increased SOB x 1 year and worsening. Pt states that she has episodes where she cannot breathe and had chest tightness. Pt states that she feels like someone is constantly holding her tight around the neck. Pt reports having mainly dry cough but will produce mucus at times.   She is compliant with CPAP with no issues of mask or pressure She is here today due to episode of sudden onset dyspnea . She has generalized anxiety disorder and takes Prozac and perphenazine she felt that yesterday was a good day and went to the store and suddenly felt short of breath and could not speak, she did walk out to her car and settle down for a minute before her dyspnea improved spontaneously. There is no coughing or wheezing with this episode. She denies chest pain or palpitations  These episodes occur 2-3 times a year she also states that she cannot lie down without her CPAP   She denies leg swelling or recent long car rides, she has no prior history of VTE She does report chronic sinus congestion  Spirometry was a poor effort but showed normal lung function without airway obstruction     TEST  NPSG 06/2014: AHI 15/hr, desat 89% 06/2015 AHI 16    Review of Systems neg for any significant sore throat, dysphagia, itching, sneezing, nasal congestion or excess/ purulent secretions, fever, chills, sweats, unintended wt loss, pleuritic or exertional cp, hempoptysis, orthopnea pnd or change in chronic leg swelling. Also denies presyncope, palpitations, heartburn, abdominal pain, nausea, vomiting, diarrhea or change in bowel or urinary habits, dysuria,hematuria, rash, arthralgias, visual complaints, headache, numbness weakness or ataxia.     Objective:   Physical  Exam  Gen. Pleasant, well-nourished, in no distress ENT - no lesions, no post nasal drip Neck: No JVD, no thyromegaly, no carotid bruits Lungs: no use of accessory muscles, no dullness to percussion, clear without rales or rhonchi  Cardiovascular: Rhythm regular, heart sounds  normal, no murmurs or gallops, no peripheral edema Musculoskeletal: No deformities, no cyanosis or clubbing        Assessment & Plan:

## 2016-09-22 DIAGNOSIS — R8299 Other abnormal findings in urine: Secondary | ICD-10-CM | POA: Diagnosis not present

## 2016-09-22 DIAGNOSIS — R319 Hematuria, unspecified: Secondary | ICD-10-CM | POA: Diagnosis not present

## 2016-09-22 DIAGNOSIS — R6889 Other general symptoms and signs: Secondary | ICD-10-CM | POA: Diagnosis not present

## 2016-09-22 DIAGNOSIS — R509 Fever, unspecified: Secondary | ICD-10-CM | POA: Diagnosis not present

## 2016-09-24 ENCOUNTER — Ambulatory Visit (INDEPENDENT_AMBULATORY_CARE_PROVIDER_SITE_OTHER): Payer: Medicare Other | Admitting: Nurse Practitioner

## 2016-09-24 ENCOUNTER — Encounter: Payer: Self-pay | Admitting: Nurse Practitioner

## 2016-09-24 VITALS — BP 108/64 | HR 88 | Temp 100.1°F | Ht 65.0 in | Wt 212.0 lb

## 2016-09-24 DIAGNOSIS — Z09 Encounter for follow-up examination after completed treatment for conditions other than malignant neoplasm: Secondary | ICD-10-CM

## 2016-09-24 DIAGNOSIS — M545 Low back pain, unspecified: Secondary | ICD-10-CM

## 2016-09-24 DIAGNOSIS — R319 Hematuria, unspecified: Secondary | ICD-10-CM | POA: Diagnosis not present

## 2016-09-24 DIAGNOSIS — G8929 Other chronic pain: Secondary | ICD-10-CM | POA: Diagnosis not present

## 2016-09-24 NOTE — Patient Instructions (Signed)

## 2016-09-24 NOTE — Progress Notes (Signed)
   Subjective:    Patient ID: Grace Barajas, female    DOB: [birth year], 50 y.o.   MRN: [id number]  HPI Patient is here today for hospital follow up. She went to ER 09/22/16 C/o: - flu like symptoms- they went ahead and treated her with tamiflu even though flu test was negative. SHe is still running a fever today and feels achy. - c/o back pain- they did a urine on her and which was positive for hematuria and they started her on Bactrim. They did referral for her to urology for the hematuria. SHe has had test run on back- MRI etc and they can not find  Anything wrong with her back per say. Rates back pain 8-9/10 and movement increases pain. SHe has seen dr. Nelva Bush in the past and he wanted to give her medicine or do back injections but she refused.  *They wanted her to follow up with PCP today.   Review of Systems  Constitutional: Positive for fatigue.  HENT: Negative.   Respiratory: Negative.   Cardiovascular: Negative.   Gastrointestinal: Negative.   Genitourinary: Negative.   Musculoskeletal: Positive for back pain.  Neurological: Negative.   Psychiatric/Behavioral: Negative.   All other systems reviewed and are negative.      Objective:   Physical Exam  Constitutional: She is oriented to person, place, and time. She appears well-developed and well-nourished. No distress.  HENT:  Right Ear: Hearing, tympanic membrane, external ear and ear canal normal.  Left Ear: Hearing, tympanic membrane, external ear and ear canal normal.  Nose: Mucosal edema and rhinorrhea present. Right sinus exhibits no maxillary sinus tenderness and no frontal sinus tenderness. Left sinus exhibits no maxillary sinus tenderness and no frontal sinus tenderness.  Mouth/Throat: Uvula is midline, oropharynx is clear and moist and mucous membranes are normal.  Cardiovascular: Normal rate, regular rhythm and normal heart sounds.   Pulmonary/Chest: Effort normal and breath sounds normal. No respiratory  distress. She has no wheezes.  Dry hacky cough  Musculoskeletal:  FROM of lumbar spine without pain (-) SLR bil  Neurological: She is alert and oriented to person, place, and time. She has normal reflexes.  Skin: Skin is warm.  Psychiatric: She has a normal mood and affect. Her behavior is normal. Judgment and thought content normal.   BP 108/64   Pulse 88   Temp 100.1 F (37.8 C) (Oral)   Ht 5\' 5"  (1.651 m)   Wt 212 lb (96.2 kg)   LMP 01/11/2013   BMI 35.28 kg/m        Assessment & Plan:  1. Hospital discharge follow-up Hospital records reviewed  2. Hematuria, unspecified type Keep appointment with specialist Continue bactrim as rx Force fuids - Ambulatory referral to Urology  3. Chronic bilateral low back pain without sciatica Will deal with back pain further once hematuria is checked out and we know what is going on If cannot wait will schedule follow up with Dr. Grant Fontana, FNP

## 2016-09-25 DIAGNOSIS — J40 Bronchitis, not specified as acute or chronic: Secondary | ICD-10-CM | POA: Diagnosis not present

## 2016-09-26 ENCOUNTER — Encounter (HOSPITAL_BASED_OUTPATIENT_CLINIC_OR_DEPARTMENT_OTHER): Payer: Self-pay | Admitting: Respiratory Therapy

## 2016-09-26 ENCOUNTER — Emergency Department (HOSPITAL_BASED_OUTPATIENT_CLINIC_OR_DEPARTMENT_OTHER): Payer: Medicare Other

## 2016-09-26 ENCOUNTER — Emergency Department (HOSPITAL_BASED_OUTPATIENT_CLINIC_OR_DEPARTMENT_OTHER)
Admission: EM | Admit: 2016-09-26 | Discharge: 2016-09-26 | Disposition: A | Discharge status: Home / Self Care | Payer: Medicare Other | Attending: Emergency Medicine | Admitting: Emergency Medicine

## 2016-09-26 DIAGNOSIS — Z79899 Other long term (current) drug therapy: Secondary | ICD-10-CM | POA: Diagnosis not present

## 2016-09-26 DIAGNOSIS — J189 Pneumonia, unspecified organism: Secondary | ICD-10-CM | POA: Diagnosis not present

## 2016-09-26 DIAGNOSIS — J181 Lobar pneumonia, unspecified organism: Secondary | ICD-10-CM | POA: Diagnosis not present

## 2016-09-26 DIAGNOSIS — R05 Cough: Secondary | ICD-10-CM

## 2016-09-26 DIAGNOSIS — R059 Cough, unspecified: Secondary | ICD-10-CM

## 2016-09-26 DIAGNOSIS — R0602 Shortness of breath: Secondary | ICD-10-CM | POA: Diagnosis not present

## 2016-09-26 NOTE — ED Notes (Signed)
Patient transported to X-ray 

## 2016-09-26 NOTE — ED Provider Notes (Signed)
[facility] Provider Note   CSN: [id number] Arrival date & time: 09/26/16  [id number]  By signing my name below, I, Grace Barajas, attest that this documentation has been prepared under the direction and in the presence of Fatima Blank, MD . Electronically Signed: Neta Barajas, ED Scribe. 09/26/2016. 7:29 PM.    History   Chief Complaint Chief Complaint  Patient presents with  . Shortness of Breath    The history is provided by the patient. No language interpreter was used.   HPI Comments:  Grace Barajas is a 50 y.o. female with PMHx of sleep apnea who presents to the Emergency Department complaining of multiple intermittent episodes of SOB that occurred today. Pt states that several times today, she had significant SOB brought on by a cough. Pt notes several similar episodes over the past 2 weeks. Pt states that it felt like her airway was narrowing, and states that these episodes are typically brought on by coughing. Pt states that 2 weeks ago she suddenly began to have another episode of cough and SOB. Pt complains of associated congestion, productive cough with clear sputum, elevated temperature, vomiting (1 episode 4 days ago). Pt sees a pulmonologist at [facility] for sleep apnea, and uses a CPAP machine at home. Pt reports that she has been having breathing problems for 1 year, and notes that a few times a year she has episodes where she cannot breath at all that least for a few minutes. Pt does not smoke. Pt states that now she feels normal and is not having any SOB. Pt used a symbicort inhaler with moderate relief. Pt reports sick contact, daughter has a cough. Pt denies rhinorrhea, diarrhea, abdominal pain, chest pain.   Past Medical History:  Diagnosis Date  . Anxiety   . Depression   . High cholesterol   . Sinus disorder   . Snores   . Wears glasses     Patient Active Problem List   Diagnosis Date Noted  . Obesity (BMI 30.0-34.9) 01/23/2016   . Hypertriglyceridemia 01/23/2016  . Allergic rhinitis 09/04/2015  . Cannot sleep 12/30/2014  . OSA (obstructive sleep apnea) 07/29/2014  . Depression 05/23/2014  . GAD (generalized anxiety disorder) 05/23/2014  . Dyspnea 12/30/2011  . Schizo-affective psychosis ([place]) 08/08/2011    Past Surgical History:  Procedure Laterality Date  . ABDOMINAL HYSTERECTOMY    . BILATERAL SALPINGECTOMY Bilateral 01/21/2013   Procedure: BILATERAL SALPINGECTOMY;  Surgeon: Marvene Staff, MD;  Location: [facility];  Service: Gynecology;  Laterality: Bilateral;  . CARPAL TUNNEL RELEASE Left 07/03/2007  . CARPAL TUNNEL RELEASE Right 07/31/2007  . CERVICAL FUSION     C1/C2  . DORSAL COMPARTMENT RELEASE Bilateral 01/13/2014   Procedure: BILATERAL 1ST DORSAL COMPARTMENT RELEASES;  Surgeon: Cammie Sickle., MD;  Location: [facility];  Service: Orthopedics;  Laterality: Bilateral;  bilateral  . ROBOTIC ASSISTED TOTAL HYSTERECTOMY N/A 01/21/2013   Procedure: ROBOTIC ASSISTED TOTAL HYSTERECTOMY;  Surgeon: Marvene Staff, MD;  Location: [facility];  Service: Gynecology;  Laterality: N/A;  . TUBAL LIGATION      OB History    No data available       Home Medications    Prior to Admission medications   Medication Sig Start Date End Date Taking? Authorizing Provider  atorvastatin (LIPITOR) 40 MG tablet Take 40 mg by mouth daily. Reported on 01/23/2016 11/30/14   Historical Provider, MD  Biotin 5 MG TABS Take by mouth.  Historical Provider, MD  cetirizine (ZYRTEC) 10 MG tablet Take 10 mg by mouth daily.    Historical Provider, MD  ferrous sulfate 325 (65 FE) MG tablet Take 325 mg by mouth daily with breakfast.    Historical Provider, MD  FLUoxetine (PROZAC) 10 MG capsule Take 40 mg by mouth daily.     Historical Provider, MD  Multiple Vitamins-Minerals (MULTIVITAMIN WITH MINERALS) tablet Take 1 tablet by mouth daily.    Historical Provider, MD  Omega-3 Fatty Acids (FISH OIL) 1000 MG CAPS Take  1 capsule by mouth daily.     Historical Provider, MD  perphenazine (TRILAFON) 4 MG tablet Take 4 mg by mouth at bedtime. 11/30/14   Historical Provider, MD  Vitamin D, Ergocalciferol, (DRISDOL) 50000 units CAPS capsule Take ONE (1)  capsule by mouth every 7 days 01/18/16   Sharion Balloon, FNP    Family History Family History  Problem Relation Age of Onset  . Asthma Son   . Asthma Brother     Social History Social History  Substance Use Topics  . Smoking status: Never Smoker  . Smokeless tobacco: Never Used  . Alcohol use Yes     Comment: social     Allergies   Buprenorphine hcl and Morphine and related   Review of Systems Review of Systems  HENT: Positive for congestion. Negative for rhinorrhea.   Respiratory: Positive for cough and shortness of breath.   Cardiovascular: Negative for chest pain.  Gastrointestinal: Positive for vomiting. Negative for abdominal pain and diarrhea.  All other systems reviewed and are negative.    Physical Exam Updated Vital Signs BP 105/58   Pulse 97   Temp 98.5 F (36.9 C) (Oral)   Resp 18   Ht 5\' 5"  (1.651 m)   Wt 212 lb (96.2 kg)   LMP 01/11/2013   SpO2 98%   BMI 35.28 kg/m   Physical Exam  Constitutional: She is oriented to person, place, and time. She appears well-developed and well-nourished. No distress.  HENT:  Head: Normocephalic and atraumatic.  Nose: Nose normal.  Mouth/Throat: Oropharynx is clear and moist.  Swollen nasal musoca  Eyes: Conjunctivae and EOM are normal. Pupils are equal, round, and reactive to light. Right eye exhibits no discharge. Left eye exhibits no discharge. No scleral icterus.  Neck: Normal range of motion. Neck supple.  Cardiovascular: Normal rate and regular rhythm.  Exam reveals no gallop and no friction rub.   No murmur heard. Pulmonary/Chest: Effort normal and breath sounds normal. No stridor. No respiratory distress. She has no wheezes. She has no rales.  Abdominal: Soft. She exhibits no  distension. There is no tenderness.  Musculoskeletal: She exhibits no edema or tenderness.  No lower extremity edema.   Neurological: She is alert and oriented to person, place, and time.  Skin: Skin is warm and dry. No rash noted. She is not diaphoretic. No erythema.  Psychiatric: She has a normal mood and affect.  Vitals reviewed.    ED Treatments / Results  DIAGNOSTIC STUDIES:  Oxygen Saturation is 97% on RA, normal by my interpretation.    COORDINATION OF CARE:  7:29 PM Discussed treatment plan with pt at bedside and pt agreed to plan.   Labs (all labs ordered are listed, but only abnormal results are displayed) Labs Reviewed - No data to display  EKG  EKG Interpretation None       Radiology Dg Chest 2 View  Result Date: 09/26/2016 CLINICAL DATA:  Shortness of breath  and cough. EXAM: CHEST  2 VIEW COMPARISON:  09/12/2016 chest radiograph FINDINGS: The cardiomediastinal silhouette is unremarkable. New patchy airspace disease within the left upper lung likely represents pneumonia. Peribronchial thickening is again noted. There is no evidence of pneumothorax, pleural effusion or acute bony abnormality. IMPRESSION: New patchy left upper lung airspace disease likely represent pneumonia. Radiographic follow-up to resolution is recommended. Peribronchial thickening again noted. Electronically Signed   By: Margarette Canada M.D.   On: 09/26/2016 19:57    Procedures Procedures (including critical care time)  Medications Ordered in ED Medications - No data to display   Initial Impression / Assessment and Plan / ED Course  I have reviewed the triage vital signs and the nursing notes.  Pertinent labs & imaging results that were available during my care of the patient were reviewed by me and considered in my medical decision making (see chart for details).  Clinical Course as of Sep 26 2053  Thu Sep 26, 2016  2044 Cxr with likely PNA. Pt already on Azithro. No indication for  additional Abx or change. Will have pt follow up with PCP.  Pt's symptoms of stricture in throat after coughing could possibly be vocal cord spasm. No allergic triggers noted. No evidence of anaphylaxis or angioedema.  Will have pt follow up with ENT.  The patient is safe for discharge with strict return precautions.   [PC]    Clinical Course User Index [PC] Fatima Blank, MD      Final Clinical Impressions(s) / ED Diagnoses   Final diagnoses:  Cough  Community acquired pneumonia of right upper lobe of lung [facility])    New Prescriptions Current Discharge Medication List     Disposition: Discharge  Condition: Good  I have discussed the results, Dx and Tx plan with the patient who expressed understanding and agree(s) with the plan. Discharge instructions discussed at great length. The patient was given strict return precautions who verbalized understanding of the instructions. No further questions at time of discharge.    Current Discharge Medication List      Follow Up: Sharion Balloon, [place] [address] [phone]  Schedule an appointment as soon as possible for a visit  in 3-5 days, If symptoms do not improve or  worsen  Izora Gala, MD [address] [place] [address] [place] [address] [phone]  Call  As needed for evaluation of possible vocal cord spasm   I personally performed the services described in this documentation, which was scribed in my presence. The recorded information has been reviewed and is accurate.        Fatima Blank, MD 09/26/16 2055

## 2016-09-26 NOTE — ED Notes (Signed)
ED Provider at bedside. 

## 2016-09-26 NOTE — ED Triage Notes (Signed)
Sob and red urine for 2 weeks. She was seen by her pulmonologist and UC x 2 with no improvement after numerous med changes.

## 2016-09-26 NOTE — ED Notes (Signed)
Pt up ambulating in room when I entered for assessment. Pt speaking in complete sentences between breaths, no use of accessory muscles.

## 2016-09-27 ENCOUNTER — Telehealth: Payer: Self-pay | Admitting: Pulmonary Disease

## 2016-09-27 ENCOUNTER — Telehealth: Payer: Self-pay | Admitting: Family

## 2016-09-27 MED ORDER — PREDNISONE 10 MG PO TABS: 30.0000 mg | ORAL_TABLET | Freq: Every day | ORAL | 0 refills | Status: AC

## 2016-09-27 MED ORDER — BUDESONIDE-FORMOTEROL FUMARATE 160-4.5 MCG/ACT IN AERO: 2.0000 | INHALATION_SPRAY | Freq: Two times a day (BID) | RESPIRATORY_TRACT | 12 refills | Status: DC

## 2016-09-27 MED ORDER — LEVOFLOXACIN 750 MG PO TABS: 750.0000 mg | ORAL_TABLET | Freq: Every day | ORAL | 0 refills | Status: AC

## 2016-09-27 NOTE — Telephone Encounter (Signed)
Faxed /lat °

## 2016-09-27 NOTE — Telephone Encounter (Signed)
   Patient is calling because she is more winded compared to yesterday.  Allegedly known to have asthma. Recent dyspnea. Went to urgent care last weekend and influenza was negative. Was started on Z-Pak. Was prescribed prednisone. She ran out off albuterol. She was at the emergency room last night. Chest x-ray showed left upper lung pneumonia.  We'll prescribe albuterol.. Continue Symbicort 160/4.5, 2 puffs twice a day.  Will switch antibiotics to levofloxacin, 751 g daily for 7 days. We'll continue her on prednisone 30 mg a day for 5 days. Advised patient to go to the emergency room if she is not better. Likely will need IV steroids and antibiotics. Patient understands.

## 2016-09-30 DIAGNOSIS — R350 Frequency of micturition: Secondary | ICD-10-CM | POA: Diagnosis not present

## 2016-09-30 DIAGNOSIS — R310 Gross hematuria: Secondary | ICD-10-CM | POA: Diagnosis not present

## 2016-10-03 ENCOUNTER — Telehealth: Payer: Self-pay | Admitting: Pulmonary Disease

## 2016-10-03 MED ORDER — DOXYCYCLINE HYCLATE 100 MG PO TABS: 100.0000 mg | ORAL_TABLET | Freq: Two times a day (BID) | ORAL | 0 refills | Status: DC

## 2016-10-03 NOTE — Telephone Encounter (Signed)
CY  Please advise-Sick Message  This is a RA pt. Pt. C/o of coughing not able to produce much, but says she has a lot of congestion. States she has tried mucinex  But not much relief. Has some wheezing. Denies fever,sob. Would like to know if something could be prescribed.

## 2016-10-03 NOTE — Telephone Encounter (Signed)
Called and spoke to pt. Informed her of the recs per CY. Rx sent to preferred pharmacy. Pt verbalized understanding and denied any further questions or concerns at this time.   

## 2016-10-03 NOTE — Telephone Encounter (Signed)
Pt returning call.Grace Barajas ° °

## 2016-10-03 NOTE — Telephone Encounter (Signed)
LM x 1 

## 2016-10-03 NOTE — Telephone Encounter (Signed)
Offer Doxycycline 100 mg, # 14, 1 twice daily           Mucinex DM otc

## 2016-10-04 ENCOUNTER — Telehealth: Payer: Self-pay | Admitting: Adult Health

## 2016-10-04 NOTE — Telephone Encounter (Signed)
[employer] stated I am unsure if we have received the paperwork will forward to the nurse to follow up on. They said the form is a provider query.

## 2016-10-08 NOTE — Telephone Encounter (Signed)
Calling to check on status of forms.Grace Barajas ° °

## 2016-10-08 NOTE — Telephone Encounter (Signed)
Forms located - they have not been signed as of yet [facility] and spoke with Harmon Pier - this form is for the coding dept needing clarification from the office visit.  This will need to be signed by the end of the week.  Ref #: [id number]  Routing to TP and Amanda's box

## 2016-10-10 ENCOUNTER — Telehealth: Payer: Self-pay | Admitting: Pulmonary Disease

## 2016-10-10 NOTE — Telephone Encounter (Signed)
Have I seen pt , could not find note , what is papers about?

## 2016-10-10 NOTE — Telephone Encounter (Signed)
Spoke with pt. Per the pt, she has been battling PNA for the past few months. States that she is having lots of congestion and would like an appointment tomorrow. I have checked all of the schedules and we do not have any available appointments. I have relayed this information to the pt. She was a little put back that we didn't have anything to offer. Advised her that I sincerely apologized but there was nothing available. Pt is going to check with her PCP to see if they can see her.

## 2016-10-10 NOTE — Telephone Encounter (Signed)
Looks like they sent this to you as a doc of the day msg? RA still out of the office. Jess had stated the form was regarding coding of the last ov and it should be in your lookat, thanks

## 2016-10-15 ENCOUNTER — Ambulatory Visit (INDEPENDENT_AMBULATORY_CARE_PROVIDER_SITE_OTHER): Payer: Medicare Other

## 2016-10-15 ENCOUNTER — Ambulatory Visit: Payer: Self-pay | Admitting: Family

## 2016-10-15 ENCOUNTER — Ambulatory Visit (INDEPENDENT_AMBULATORY_CARE_PROVIDER_SITE_OTHER): Payer: Medicare Other | Admitting: Family

## 2016-10-15 VITALS — BP 124/80 | HR 93 | Temp 99.4°F | Ht 65.0 in | Wt 211.2 lb

## 2016-10-15 DIAGNOSIS — R509 Fever, unspecified: Secondary | ICD-10-CM

## 2016-10-15 DIAGNOSIS — J189 Pneumonia, unspecified organism: Secondary | ICD-10-CM

## 2016-10-15 MED ORDER — PREDNISONE 10 MG (21) PO TBPK: ORAL_TABLET | ORAL | 0 refills | Status: DC

## 2016-10-15 MED ORDER — AMOXICILLIN-POT CLAVULANATE ER 1000-62.5 MG PO TB12: 2.0000 | ORAL_TABLET | Freq: Two times a day (BID) | ORAL | 0 refills | Status: DC

## 2016-10-15 NOTE — Telephone Encounter (Signed)
TP please advise.  Will forward to [place] to follow up on.

## 2016-10-15 NOTE — Patient Instructions (Signed)
Community-Acquired Pneumonia, Adult °Pneumonia is an infection of the lungs. There are different types of pneumonia. One type can develop while a person is in a hospital. A different type, called community-acquired pneumonia, develops in people who are not, or have not recently been, in the hospital or other health care facility. °What are the causes? °Pneumonia may be caused by bacteria, viruses, or funguses. Community-acquired pneumonia is often caused by Streptococcus pneumonia bacteria. These bacteria are often passed from one person to another by breathing in droplets from the cough or sneeze of an infected person. °What increases the risk? °The condition is more likely to develop in: °· People who have chronic diseases, such as chronic obstructive pulmonary disease (COPD), asthma, congestive heart failure, cystic fibrosis, diabetes, or kidney disease. °· People who have early-stage or late-stage HIV. °· People who have sickle cell disease. °· People who have had their spleen removed (splenectomy). °· People who have poor dental hygiene. °· People who have medical conditions that increase the risk of breathing in (aspirating) secretions their own mouth and nose. °· People who have a weakened immune system (immunocompromised). °· People who smoke. °· People who travel to areas where pneumonia-causing germs commonly exist. °· People who are around animal habitats or animals that have pneumonia-causing germs, including birds, bats, rabbits, cats, and farm animals. ° °What are the signs or symptoms? °Symptoms of this condition include: °· A dry cough. °· A wet (productive) cough. °· Fever. °· Sweating. °· Chest pain, especially when breathing deeply or coughing. °· Rapid breathing or difficulty breathing. °· Shortness of breath. °· Shaking chills. °· Fatigue. °· Muscle aches. ° °How is this diagnosed? °Your health care provider will take a medical history and perform a physical exam. You may also have other tests,  including: °· Imaging studies of your chest, including X-rays. °· Tests to check your blood oxygen level and other blood gases. °· Other tests on blood, mucus (sputum), fluid around your lungs (pleural fluid), and urine. ° °If your pneumonia is severe, other tests may be done to identify the specific cause of your illness. °How is this treated? °The type of treatment that you receive depends on many factors, such as the cause of your pneumonia, the medicines you take, and other medical conditions that you have. For most adults, treatment and recovery from pneumonia may occur at home. In some cases, treatment must happen in a hospital. Treatment may include: °· Antibiotic medicines, if the pneumonia was caused by bacteria. °· Antiviral medicines, if the pneumonia was caused by a virus. °· Medicines that are given by mouth or through an IV tube. °· Oxygen. °· Respiratory therapy. ° °Although rare, treating severe pneumonia may include: °· Mechanical ventilation. This is done if you are not breathing well on your own and you cannot maintain a safe blood oxygen level. °· Thoracentesis. This procedure removes fluid around one lung or both lungs to help you breathe better. ° °Follow these instructions at home: °· Take over-the-counter and prescription medicines only as told by your health care provider. °? Only take cough medicine if you are losing sleep. Understand that cough medicine can prevent your body’s natural ability to remove mucus from your lungs. °? If you were prescribed an antibiotic medicine, take it as told by your health care provider. Do not stop taking the antibiotic even if you start to feel better. °· Sleep in a semi-upright position at night. Try sleeping in a reclining chair, or place a few pillows under your head. °· Do not use tobacco products, including cigarettes, chewing   tobacco, and e-cigarettes. If you need help quitting, ask your health care provider. °· Drink enough water to keep your urine  clear or pale yellow. This will help to thin out mucus secretions in your lungs. °How is this prevented? °There are ways that you can decrease your risk of developing community-acquired pneumonia. Consider getting a pneumococcal vaccine if: °· You are older than 51 years of age. °· You are older than 51 years of age and are undergoing cancer treatment, have chronic lung disease, or have other medical conditions that affect your immune system. Ask your health care provider if this applies to you. ° °There are different types and schedules of pneumococcal vaccines. Ask your health care provider which vaccination option is best for you. °You may also prevent community-acquired pneumonia if you take these actions: °· Get an influenza vaccine every year. Ask your health care provider which type of influenza vaccine is best for you. °· Go to the dentist on a regular basis. °· Wash your hands often. Use hand sanitizer if soap and water are not available. ° °Contact a health care provider if: °· You have a fever. °· You are losing sleep because you cannot control your cough with cough medicine. °Get help right away if: °· You have worsening shortness of breath. °· You have increased chest pain. °· Your sickness becomes worse, especially if you are an older adult or have a weakened immune system. °· You cough up blood. °This information is not intended to replace advice given to you by your health care provider. Make sure you discuss any questions you have with your health care provider. °Document Released: 10/14/2005 Document Revised: 02/22/2016 Document Reviewed: 02/08/2015 °Elsevier Interactive Patient Education © 2017 Elsevier Inc. ° °

## 2016-10-15 NOTE — Progress Notes (Signed)
Subjective:    Patient ID: Grace Barajas, female    DOB: 01/21/1966, 50 y.o.   MRN: [id number]  Pt presents to the office today to recheck CAP. PT was diagnosed with pneumonia on 09/26/16 and states she has improved, but continues to have congestion and cough. Cough  This is a new problem. The current episode started 1 to 4 weeks ago. The problem has been gradually improving. The problem occurs every few minutes. The cough is productive of sputum. Associated symptoms include a fever, nasal congestion and shortness of breath. Pertinent negatives include no chills, ear congestion, ear pain or wheezing. The symptoms are aggravated by lying down. She has tried rest, OTC cough suppressant and oral steroids (antibiotic) for the symptoms. The treatment provided moderate relief.      Review of Systems  Constitutional: Positive for fever. Negative for chills.  HENT: Negative for ear pain.   Respiratory: Positive for cough and shortness of breath. Negative for wheezing.   All other systems reviewed and are negative.      Objective:   Physical Exam  Constitutional: She is oriented to person, place, and time. She appears well-developed and well-nourished. No distress.  HENT:  Head: Normocephalic and atraumatic.  Mouth/Throat: Posterior oropharyngeal erythema present.  Eyes: Pupils are equal, round, and reactive to light.  Neck: Normal range of motion. Neck supple. No thyromegaly present.  Cardiovascular: Normal rate, regular rhythm, normal heart sounds and intact distal pulses.   No murmur heard. Pulmonary/Chest: Effort normal and breath sounds normal. No respiratory distress. She has no decreased breath sounds. She has no wheezes.  Abdominal: Soft. Bowel sounds are normal. She exhibits no distension. There is no tenderness.  Musculoskeletal: Normal range of motion. She exhibits no edema or tenderness.  Neurological: She is alert and oriented to person, place, and time. She has normal  reflexes. No cranial nerve deficit.  Skin: Skin is warm and dry.  Psychiatric: She has a normal mood and affect. Her behavior is normal. Judgment and thought content normal.  Vitals reviewed.     BP 124/80   Pulse 93   Temp 99.4 F (37.4 C) (Oral)   Ht 5\' 5"  (1.651 m)   Wt 211 lb 3.2 oz (95.8 kg)   LMP 01/11/2013   BMI 35.15 kg/m      Assessment & Plan:  1. Community acquired pneumonia, unspecified laterality -- Take meds as prescribed - Use a cool mist humidifier  -Use saline nose sprays frequently -Saline irrigations of the nose can be very helpful if done frequently.  * 4X daily for 1 week*  * Use of a nettie pot can be helpful with this. Follow directions with this* -Force fluids -For any cough or congestion  Use plain Mucinex- regular strength or max strength is fine   * Children- consult with Pharmacist for dosing -For fever or aces or pains- take tylenol or ibuprofen appropriate for age and weight.  * for fevers greater than 101 orally you may alternate ibuprofen and tylenol every  3 hours. -Throat lozenges if help - DG Chest 2 View; Future - predniSONE (STERAPRED UNI-PAK 21 TAB) 10 MG (21) TBPK tablet; Use as directed  Dispense: 21 tablet; Refill: 0 - amoxicillin-clavulanate (AUGMENTIN XR) 1000-62.5 MG 12 hr tablet; Take 2 tablets by mouth 2 (two) times daily.  Dispense: 28 tablet; Refill: 0  2. Low grade fever - predniSONE (STERAPRED UNI-PAK 21 TAB) 10 MG (21) TBPK tablet; Use as directed  Dispense: 21 tablet; Refill:  0 - amoxicillin-clavulanate (AUGMENTIN XR) 1000-62.5 MG 12 hr tablet; Take 2 tablets by mouth 2 (two) times daily.  Dispense: 28 tablet; Refill: 0  Evelina Dun, FNP

## 2016-10-15 NOTE — Telephone Encounter (Signed)
Spoke with Jad with [employer] and advised that signed form was faxed back today.  Nothing further needed

## 2016-10-16 ENCOUNTER — Telehealth: Payer: Self-pay | Admitting: Family

## 2016-10-17 ENCOUNTER — Telehealth: Payer: Self-pay | Admitting: *Deleted

## 2016-10-17 NOTE — Telephone Encounter (Signed)
Pt notified of results Verbalizes understanding 

## 2016-10-17 NOTE — Telephone Encounter (Signed)
-----   Message from Sharion Balloon, [place] sent at 10/16/2016  6:11 PM EST ----- Chest x-ray negative- PT can stop antibiotic, but continue prednisone

## 2016-10-28 DIAGNOSIS — J189 Pneumonia, unspecified organism: Secondary | ICD-10-CM

## 2016-10-28 HISTORY — DX: Pneumonia, unspecified organism: J18.9

## 2016-11-10 ENCOUNTER — Emergency Department (HOSPITAL_BASED_OUTPATIENT_CLINIC_OR_DEPARTMENT_OTHER)
Admission: EM | Admit: 2016-11-10 | Discharge: 2016-11-10 | Disposition: A | Discharge status: Home / Self Care | Payer: Medicare Other | Attending: Emergency Medicine | Admitting: Emergency Medicine

## 2016-11-10 ENCOUNTER — Emergency Department (HOSPITAL_BASED_OUTPATIENT_CLINIC_OR_DEPARTMENT_OTHER): Payer: Medicare Other

## 2016-11-10 ENCOUNTER — Encounter (HOSPITAL_BASED_OUTPATIENT_CLINIC_OR_DEPARTMENT_OTHER): Payer: Self-pay | Admitting: *Deleted

## 2016-11-10 DIAGNOSIS — R42 Dizziness and giddiness: Secondary | ICD-10-CM | POA: Insufficient documentation

## 2016-11-10 DIAGNOSIS — R0602 Shortness of breath: Secondary | ICD-10-CM | POA: Diagnosis not present

## 2016-11-10 DIAGNOSIS — R911 Solitary pulmonary nodule: Secondary | ICD-10-CM | POA: Insufficient documentation

## 2016-11-10 DIAGNOSIS — R05 Cough: Secondary | ICD-10-CM | POA: Diagnosis not present

## 2016-11-10 HISTORY — DX: Pneumonia, unspecified organism: J18.9

## 2016-11-10 LAB — CBC WITH DIFFERENTIAL/PLATELET
BASOS PCT: 1 %
Basophils Absolute: 0 10*3/uL (ref 0.0–0.1)
Eosinophils Absolute: 0.4 10*3/uL (ref 0.0–0.7)
Eosinophils Relative: 6 %
HEMATOCRIT: 36.6 % (ref 36.0–46.0)
HEMOGLOBIN: 12.5 g/dL (ref 12.0–15.0)
LYMPHS ABS: 2.5 10*3/uL (ref 0.7–4.0)
LYMPHS PCT: 34 %
MCH: 29.1 pg (ref 26.0–34.0)
MCHC: 34.2 g/dL (ref 30.0–36.0)
MCV: 85.3 fL (ref 78.0–100.0)
MONOS PCT: 5 %
Monocytes Absolute: 0.4 10*3/uL (ref 0.1–1.0)
NEUTROS ABS: 3.9 10*3/uL (ref 1.7–7.7)
NEUTROS PCT: 54 %
Platelets: 425 10*3/uL — ABNORMAL HIGH (ref 150–400)
RBC: 4.29 MIL/uL (ref 3.87–5.11)
RDW: 13.8 % (ref 11.5–15.5)
WBC: 7.2 10*3/uL (ref 4.0–10.5)

## 2016-11-10 LAB — BASIC METABOLIC PANEL
Anion gap: 10 (ref 5–15)
BUN: 10 mg/dL (ref 6–20)
CHLORIDE: 106 mmol/L (ref 101–111)
CO2: 24 mmol/L (ref 22–32)
CREATININE: 0.59 mg/dL (ref 0.44–1.00)
Calcium: 10.2 mg/dL (ref 8.9–10.3)
GFR calc non Af Amer: >60 mL/min (ref >60)
GLUCOSE: 138 mg/dL — AB (ref 65–99)
Potassium: 3.3 mmol/L — ABNORMAL LOW (ref 3.5–5.1)
Sodium: 140 mmol/L (ref 135–145)

## 2016-11-10 LAB — TROPONIN I: Troponin I: <0.03 ng/mL (ref <0.03)

## 2016-11-10 MED ORDER — SODIUM CHLORIDE 0.9 % IV BOLUS (SEPSIS)
1000.0000 mL | Freq: Once | INTRAVENOUS | Status: AC
  Administered 2016-11-10: 1000 mL via INTRAVENOUS

## 2016-11-10 MED ORDER — IOPAMIDOL (ISOVUE-370) INJECTION 76%
100.0000 mL | Freq: Once | INTRAVENOUS | Status: AC | PRN
  Administered 2016-11-10: 100 mL via INTRAVENOUS

## 2016-11-10 MED ORDER — POTASSIUM CHLORIDE CRYS ER 20 MEQ PO TBCR
40.0000 meq | EXTENDED_RELEASE_TABLET | Freq: Once | ORAL | Status: AC
  Administered 2016-11-10: 40 meq via ORAL
  Filled 2016-11-10: qty 2

## 2016-11-10 MED ORDER — ACETAMINOPHEN 325 MG PO TABS
650.0000 mg | ORAL_TABLET | Freq: Once | ORAL | Status: AC
  Administered 2016-11-10: 650 mg via ORAL
  Filled 2016-11-10: qty 2

## 2016-11-10 NOTE — ED Notes (Signed)
ED Provider at bedside. 

## 2016-11-10 NOTE — ED Notes (Signed)
Patient transported to CT 

## 2016-11-10 NOTE — Discharge Instructions (Addendum)
Your imaging did not show any sings of pneumonia or blood clot. Below is further information of the CT lung scan. Please call your pulmonologist to inform them of the results so they can figure out how to best monitor the nodules and/or how to proceed.   Your imaging did show the following: CT lung  - 9 mm average diameter right lower lobe lung nodule. This is nonspecific - Possible small left adrenal mass on the last axial image, measuring 1.1 x 1.1 cm. This could represent a small adenoma or metastasis. - 10 mm and 8 mm left lobe thyroid nodules. Consider further evaluation with thyroid ultrasound.

## 2016-11-10 NOTE — ED Provider Notes (Signed)
[facility] DEPT [facility] Provider Note   CSN: [id number] Arrival date & time: 11/10/16  [id number]     History   Chief Complaint Chief Complaint  Patient presents with  . Shortness of Breath    HPI Grace Barajas is a 51 y.o. female with PMH of Obesity, Hypertriglyceridemia, GAD, hx of recent PNA who presents with acute shortness of breath.   HPI Patient reports she was diagnosed with PNA about 2 months ago. Per chart review was last November and she was prescribed Azithromycin. She was seen in clinic again in early/mid December and was given Augmentin but repeat CXR was clear. Reports that since her first PNA diagnosis she has still had some nasal congestion and intermittent cough for which she used over the counter Claritin. She reports that last night, after eating cookies, she developed acute shortness of breath, lightheadedness, and dizziness (no sensation that the room was spinning). Denies chest pain, palpitations, diaphoresis (although she has hot flashes at times), or nausea. No fevers at home. Notes of mild decreased fluid intake for the past two days. Reports of car ride yesterday to [place] which is about 1.5hr ride one way. No personal or family history of blood clots. Denies history of cancer. No LE swelling. Reports of mild arm and calf pain.   Past Medical History:  Diagnosis Date  . Anxiety   . Depression   . High cholesterol   . Pneumonia   . Sinus disorder   . Snores   . Wears glasses     Patient Active Problem List   Diagnosis Date Noted  . Obesity (BMI 30.0-34.9) 01/23/2016  . Hypertriglyceridemia 01/23/2016  . Allergic rhinitis 09/04/2015  . Cannot sleep 12/30/2014  . OSA (obstructive sleep apnea) 07/29/2014  . Depression 05/23/2014  . GAD (generalized anxiety disorder) 05/23/2014  . Dyspnea 12/30/2011  . Schizo-affective psychosis ([place]) 08/08/2011    Past Surgical History:  Procedure Laterality Date  . ABDOMINAL HYSTERECTOMY    . BILATERAL  SALPINGECTOMY Bilateral 01/21/2013   Procedure: BILATERAL SALPINGECTOMY;  Surgeon: Marvene Staff, MD;  Location: [facility];  Service: Gynecology;  Laterality: Bilateral;  . CARPAL TUNNEL RELEASE Left 07/03/2007  . CARPAL TUNNEL RELEASE Right 07/31/2007  . CERVICAL FUSION     C1/C2  . DORSAL COMPARTMENT RELEASE Bilateral 01/13/2014   Procedure: BILATERAL 1ST DORSAL COMPARTMENT RELEASES;  Surgeon: Cammie Sickle., MD;  Location: [facility];  Service: Orthopedics;  Laterality: Bilateral;  bilateral  . ROBOTIC ASSISTED TOTAL HYSTERECTOMY N/A 01/21/2013   Procedure: ROBOTIC ASSISTED TOTAL HYSTERECTOMY;  Surgeon: Marvene Staff, MD;  Location: [facility];  Service: Gynecology;  Laterality: N/A;  . TUBAL LIGATION      OB History    No data available       Home Medications    Prior to Admission medications   Medication Sig Start Date End Date Taking? Authorizing Provider  Albuterol Sulfate 108 (90 Base) MCG/ACT AEPB Inhale into the lungs. 09/25/16 09/25/17  Historical Provider, MD  amoxicillin-clavulanate (AUGMENTIN XR) 1000-62.5 MG 12 hr tablet Take 2 tablets by mouth 2 (two) times daily. 10/15/16   Sharion Balloon, FNP  Biotin 5 MG TABS Take by mouth.    Historical Provider, MD  cetirizine (ZYRTEC) 10 MG tablet Take 10 mg by mouth daily.    Historical Provider, MD  ferrous sulfate 325 (65 FE) MG tablet Take 325 mg by mouth daily with breakfast.    Historical Provider, MD  FLUoxetine (PROZAC) 10 MG  capsule Take 20 mg by mouth daily.     Historical Provider, MD  fluticasone (FLONASE) 50 MCG/ACT nasal spray 1 spray by Both Nostrils route daily.    Historical Provider, MD  Multiple Vitamins-Minerals (MULTIVITAMIN WITH MINERALS) tablet Take 1 tablet by mouth daily.    Historical Provider, MD  Omega-3 Fatty Acids (FISH OIL) 1000 MG CAPS Take 1 capsule by mouth daily.     Historical Provider, MD  perphenazine (TRILAFON) 4 MG tablet Take 4 mg by mouth at bedtime. 11/30/14    Historical Provider, MD  predniSONE (STERAPRED UNI-PAK 21 TAB) 10 MG (21) TBPK tablet Use as directed 10/15/16   Sharion Balloon, FNP    Family History Family History  Problem Relation Age of Onset  . Asthma Son   . Asthma Brother     Social History Social History  Substance Use Topics  . Smoking status: Never Smoker  . Smokeless tobacco: Never Used  . Alcohol use Yes     Comment: social     Allergies   Buprenorphine hcl and Morphine and related   Review of Systems Review of Systems  Constitutional: Positive for appetite change. Negative for chills and fever.  HENT: Positive for congestion. Negative for sore throat.   Respiratory: Positive for cough and shortness of breath. Negative for chest tightness.   Cardiovascular: Negative for chest pain, palpitations and leg swelling.  Genitourinary: Negative for frequency.  Neurological: Positive for dizziness and light-headedness. Negative for syncope.     Physical Exam Updated Vital Signs BP 140/90   Pulse 105   Temp 99 F (37.2 C) (Oral)   Resp 16   Ht 5\' 5"  (1.651 m)   Wt 97.5 kg   LMP 01/11/2013   SpO2 100%   BMI 35.78 kg/m   Physical Exam  Constitutional: She is oriented to person, place, and time. She appears well-developed and well-nourished. No distress.  HENT:  Mouth/Throat: Oropharynx is clear and moist. No oropharyngeal exudate.  Eyes: EOM are normal.  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 Pupils reactive to light. Right eye with mild erythema of the medial sclera. Norma EOM.   Neck: Normal range of motion. Neck supple.  Cardiovascular: Regular rhythm.   Murmur heard. tachycardia  Pulmonary/Chest: Effort normal and breath sounds normal. She has no wheezes. She has no rales.  Mildly diminishes at the bases bilaterally  Abdominal: Soft. Bowel sounds are normal. She exhibits no distension. There is no tenderness.  Musculoskeletal:  No calf tenderness. No lower extremity edema.   Lymphadenopathy:      She has no cervical adenopathy.  Neurological: She is alert and oriented to person, place, and time.  Skin: Skin is warm and dry. Capillary refill takes less than 2 seconds. She is not diaphoretic.     ED Treatments / Results  Labs (all labs ordered are listed, but only abnormal results are displayed) Labs Reviewed  CBC WITH DIFFERENTIAL/PLATELET - Abnormal; Notable for the following:       Result Value   Platelets 425 (*)    All other components within normal limits  BASIC METABOLIC PANEL - Abnormal; Notable for the following:    Potassium 3.3 (*)    Glucose, Bld 138 (*)    All other components within normal limits  TROPONIN I    EKG  EKG Interpretation  Date/Time:  Sunday November 10 2016 06:27:01 EST Ventricular Rate:  107 PR Interval:    QRS Duration: 87 QT Interval:  335 QTC Calculation:  447 R Axis:   52 Text Interpretation:  eSinus tachycardia Probable left atrial enlargement Confirmed by Reather Converse MD, JOSHUA [id number]) on 11/10/2016 6:42:35 AM       Radiology Dg Chest 2 View  Result Date: 11/10/2016 CLINICAL DATA:  Productive cough. Congestion. Shortness of breath. Recent pneumonia. EXAM: CHEST  2 VIEW COMPARISON:  Two-view chest x-ray 10/15/2016. FINDINGS: Heart size is normal. Lungs are clear. Lung volumes are low. The visualized soft tissues and bony thorax are unremarkable. IMPRESSION: Negative two view chest x-ray Electronically Signed   By: San Morelle M.D.   On: 11/10/2016 07:17   Ct Angio Chest Pe W And/or Wo Contrast  Result Date: 11/10/2016 CLINICAL DATA:  Cough, chest congestion and shortness breath for the past 2 months since receiving a flu shot. EXAM: CT ANGIOGRAPHY CHEST WITH CONTRAST TECHNIQUE: Multidetector CT imaging of the chest was performed using the standard protocol during bolus administration of intravenous contrast. Multiplanar CT image reconstructions and MIPs were obtained to evaluate the vascular anatomy. CONTRAST:  100 cc Isovue 370  COMPARISON:  Chest radiographs dated 11/10/2016. FINDINGS: Cardiovascular: Satisfactory opacification of the pulmonary arteries to the segmental level. No evidence of pulmonary embolism. Normal heart size. No pericardial effusion. Mediastinum/Nodes: 10 mm densely calcified left lobe thyroid nodule and 8 mm noncalcified left lobe thyroid nodule. No enlarged lymph nodes. Lungs/Pleura: 10 x 7 mm right lower lobe nodule on image number 58 of series 5. This measures 9 mm in length on sagittal image number 64. No airspace consolidation or pleural fluid. Upper Abdomen: Mild diffuse low density of the liver. Possible small left adrenal nodule on the last axial image number 93 of series 4, measuring 1.1 x 1.1 cm. Musculoskeletal: Thoracic spine degenerative changes. No evidence of bony metastatic disease. Review of the MIP images confirms the above findings. IMPRESSION: 1. 9 mm average diameter right lower lobe lung nodule. This is nonspecific, with a small lung carcinoma in the differential. Consider one of the following in 3 months for both low-risk and high-risk individuals: (a) repeat chest CT, (b) follow-up PET-CT, or (c) tissue sampling. This recommendation follows the consensus statement: Guidelines for Management of Incidental Pulmonary Nodules Detected on CT Images: From the Fleischner Society 2017; Radiology 2017; 284:228-243. 2. Possible small left adrenal mass on the last axial image, measuring 1.1 x 1.1 cm. This could represent a small adenoma or metastasis. 3. No pulmonary emboli. 4. 10 mm and 8 mm left lobe thyroid nodules. Consider further evaluation with thyroid ultrasound. If patient is clinically hyperthyroid, consider nuclear medicine thyroid uptake and scan. 5. Mild diffuse hepatic steatosis. Electronically Signed   By: Claudie Revering M.D.   On: 11/10/2016 08:50    Procedures Procedures (including critical care time)  Medications Ordered in ED Medications  sodium chloride 0.9 % bolus 1,000 mL (0  mLs Intravenous Stopped 11/10/16 0911)  acetaminophen (TYLENOL) tablet 650 mg (650 mg Oral Given 11/10/16 0749)  iopamidol (ISOVUE-370) 76 % injection 100 mL (100 mLs Intravenous Contrast Given 11/10/16 0752)  potassium chloride SA (K-DUR,KLOR-CON) CR tablet 40 mEq (40 mEq Oral Given 11/10/16 0925)     Initial Impression / Assessment and Plan / ED Course  I have reviewed the triage vital signs and the nursing notes.  Pertinent labs & imaging results that were available during my care of the patient were reviewed by me and considered in my medical decision making (see chart for details).  Clinical Course     CBC without leukocytosis. CMP with mild  hypokalemia. Troponin I <0.03. CXR is unremarkable. EKG with sinus tachycardia without signs of ischemia. HR with mild tachycardia. BP, RR, and pulse ox normal. Temp is 26F.   1L bolus IVF ordered. Tylenol x 1 for low grade fever. With acute onset of symptoms, tachycardia, history of car ride, and otherwise normal work up thus far, will obtain CT angio chest to rule out PE.  09:15 Reports feeling better after IVF bolus. Reports she thinks shortness of breath might be related to her anxiety; reports she has been hyper-aware of her symptoms since her bout of pneumonia. Tachycardia improved. CT angio chest negative for PE or PNA. CT angio chest does show a lung nodule that is nonspecific with small cell carcinoma in the differential. Recommended follow up in 3 months with imagine vs tissue sampling. Also shows thryoid nodules and small adrenal mass possible adenoma or metastasis.   Symptoms possibly anxiety related vs related to nodule. Patient is not in any respiratory distress with stable vitals. Recommended follow up with pulmonologist and PCP. Return precautions discussed.   Final Clinical Impressions(s) / ED Diagnoses   Final diagnoses:  SOB (shortness of breath)  Lung nodule (on imaging)    New Prescriptions New Prescriptions   No medications  on file     Smiley Houseman, MD 11/10/16 [place] Yao, MD 11/12/16 1152

## 2016-11-10 NOTE — ED Notes (Signed)
To xray by stretcher

## 2016-11-10 NOTE — ED Notes (Signed)
Sx onset 1 month ago, dx'd with PNA, had 2 rounds abx, CXR showed improvement, pt of [facility]. "Never really felt like she got 100% better". Developed sob yesterday after eating an OREO cookie, also mentions red eyes, productive cough, chills, generally weak, and some dizziness (denies: any pain, known fever, nvd, bleeding), "thought it was a sinus infection", has used her symbicort inhaler, mentions not using her CPAP since PNA dx. "have a pulmonologist".

## 2016-11-11 ENCOUNTER — Telehealth: Payer: Self-pay | Admitting: Pulmonary Disease

## 2016-11-11 ENCOUNTER — Encounter: Payer: Self-pay | Admitting: Gastroenterology

## 2016-11-11 NOTE — Telephone Encounter (Signed)
Spoke with pt. And she stated she recently was in the hospital and she they suggested that she follow up at our office to discuss further scans. An appointment was made with an NP since [facility] did not have any openings. Nothing further is needed at this time.

## 2016-11-13 ENCOUNTER — Ambulatory Visit: Payer: Medicare Other | Admitting: Family

## 2016-11-14 ENCOUNTER — Inpatient Hospital Stay: Payer: Medicare Other | Admitting: Acute Care

## 2016-11-15 ENCOUNTER — Encounter: Payer: Self-pay | Admitting: Family

## 2016-11-15 ENCOUNTER — Ambulatory Visit (INDEPENDENT_AMBULATORY_CARE_PROVIDER_SITE_OTHER): Payer: Medicare Other | Admitting: Family

## 2016-11-15 VITALS — BP 130/88 | HR 90 | Temp 99.0°F | Ht 65.0 in | Wt 201.6 lb

## 2016-11-15 DIAGNOSIS — E279 Disorder of adrenal gland, unspecified: Secondary | ICD-10-CM

## 2016-11-15 DIAGNOSIS — R911 Solitary pulmonary nodule: Secondary | ICD-10-CM | POA: Diagnosis not present

## 2016-11-15 DIAGNOSIS — E278 Other specified disorders of adrenal gland: Secondary | ICD-10-CM

## 2016-11-15 DIAGNOSIS — J019 Acute sinusitis, unspecified: Secondary | ICD-10-CM | POA: Diagnosis not present

## 2016-11-15 DIAGNOSIS — E041 Nontoxic single thyroid nodule: Secondary | ICD-10-CM | POA: Diagnosis not present

## 2016-11-15 MED ORDER — AMOXICILLIN-POT CLAVULANATE 875-125 MG PO TABS: 1.0000 | ORAL_TABLET | Freq: Two times a day (BID) | ORAL | 0 refills | Status: DC

## 2016-11-15 NOTE — Patient Instructions (Signed)

## 2016-11-15 NOTE — Progress Notes (Signed)
Subjective:    Patient ID: Grace Barajas, female    DOB: July 26, 1966, 51 y.o.   MRN: [id number]  Pt present to the office today with complaints of sinus problems. PT states she went to the ED on 11/10/16 and had a CT chest that showed: IMPRESSION: 1. 9 mm average diameter right lower lobe lung nodule. This is nonspecific, with a small lung carcinoma in the differential. Consider one of the following in 3 months for both low-risk and high-risk individuals: (a) repeat chest CT, (b) follow-up PET-CT, or (c) tissue sampling. This recommendation follows the consensus statement: Guidelines for Management of Incidental Pulmonary Nodules Detected on CT Images: From the Fleischner Society 2017; Radiology 2017; 284:228-243. 2. Possible small left adrenal mass on the last axial image, measuring 1.1 x 1.1 cm. This could represent a small adenoma or metastasis. 3. No pulmonary emboli. 4. 10 mm and 8 mm left lobe thyroid nodules. Consider further evaluation with thyroid ultrasound. If patient is clinically hyperthyroid, consider nuclear medicine thyroid uptake and scan. 5. Mild diffuse hepatic steatosis.  PT states she had an appt with her Pulmonologist's  yesterday, but was cancelled related to the snow.  Pt states she see's Pulmonologist  because she has sleep apnea.  Sinus Problem  This is a recurrent problem. The current episode started 1 to 4 weeks ago. The problem has been waxing and waning since onset. The pain is mild. Associated symptoms include chills, congestion, headaches, sinus pressure and sneezing. Past treatments include oral decongestants and lying down. The treatment provided mild relief.  Fever   Associated symptoms include congestion and headaches.    [facility] notes reveiwed.  Review of Systems  Constitutional: Positive for chills and fever.  HENT: Positive for congestion, sinus pressure and sneezing.   Neurological: Positive for headaches.  All other systems  reviewed and are negative.      Objective:   Physical Exam  Constitutional: She is oriented to person, place, and time. She appears well-developed and well-nourished. No distress.  HENT:  Head: Normocephalic and atraumatic.  Right Ear: External ear normal.  Left Ear: External ear normal.  Nose: Mucosal edema and rhinorrhea present. Right sinus exhibits maxillary sinus tenderness. Left sinus exhibits maxillary sinus tenderness.  Mouth/Throat: Posterior oropharyngeal erythema present.  Eyes: Pupils are equal, round, and reactive to light.  Neck: Normal range of motion. Neck supple. No thyromegaly present.  Cardiovascular: Normal rate, regular rhythm, normal heart sounds and intact distal pulses.   No murmur heard. Pulmonary/Chest: Effort normal and breath sounds normal. No respiratory distress. She has no wheezes.  Abdominal: Soft. Bowel sounds are normal. She exhibits no distension. There is no tenderness.  Musculoskeletal: Normal range of motion. She exhibits no edema or tenderness.  Neurological: She is alert and oriented to person, place, and time. She has normal reflexes. No cranial nerve deficit.  Skin: Skin is warm and dry.  Psychiatric: She has a normal mood and affect. Her behavior is normal. Judgment and thought content normal.  Vitals reviewed.     BP 130/88   Pulse 90   Temp 99 F (37.2 C) (Oral)   Ht 5\' 5"  (1.651 m)   Wt 201 lb 9.6 oz (91.4 kg)   LMP 01/11/2013   BMI 33.55 kg/m      Assessment & Plan:  1. Lung nodule - Ambulatory referral to Oncology  2. Thyroid nodule - Ambulatory referral to Oncology  3. Adrenal mass, left ([place]) - Ambulatory referral to Oncology  4.  Acute sinusitis, recurrence not specified, unspecified location - Take meds as prescribed - Use a cool mist humidifier  -Use saline nose sprays frequently -Saline irrigations of the nose can be very helpful if done frequently.  * 4X daily for 1 week*  * Use of a nettie pot can be helpful  with this. Follow directions with this* -Force fluids -For any cough or congestion  Use plain Mucinex- regular strength or max strength is fine   * Children- consult with Pharmacist for dosing -For fever or aces or pains- take tylenol or ibuprofen appropriate for age and weight.  * for fevers greater than 101 orally you may alternate ibuprofen and tylenol every  3 hours. -Throat lozenges if help - amoxicillin-clavulanate (AUGMENTIN) 875-125 MG tablet; Take 1 tablet by mouth 2 (two) times daily.  Dispense: 14 tablet; Refill: 0  *Stat referral to Oncologists. Pt to follow up with them asap!  Evelina Dun, FNP

## 2016-11-18 ENCOUNTER — Encounter: Payer: Self-pay | Admitting: *Deleted

## 2016-11-18 DIAGNOSIS — R911 Solitary pulmonary nodule: Secondary | ICD-10-CM | POA: Insufficient documentation

## 2016-11-18 DIAGNOSIS — R918 Other nonspecific abnormal finding of lung field: Secondary | ICD-10-CM

## 2016-11-18 NOTE — Progress Notes (Signed)
Oncology Nurse Navigator Documentation  Oncology Nurse Navigator Flowsheets 11/18/2016  Navigator Location [facility]  Referral date to RadOnc/MedOnc 11/18/2016  Navigator Encounter Type Other/I received referral on Grace Barajas.  She has abnormal scan but no pathology or other workup.  I requested HIM to schedule with Dr. Julien Nordmann on 11/27/16  Treatment Phase Pre-Tx/Tx Discussion  Barriers/Navigation Needs Coordination of Care  Interventions Coordination of Care  Coordination of Care Appts  Acuity Level 2  Acuity Level 2 Assistance expediting appointments  Time Spent with Patient 30

## 2016-11-19 ENCOUNTER — Telehealth: Payer: Self-pay | Admitting: Pulmonary Disease

## 2016-11-19 NOTE — Telephone Encounter (Signed)
Hinton Dyer, I can see her - thank you for letting me know Cherina, Please offer her OV on 1/25 10 am

## 2016-11-19 NOTE — Telephone Encounter (Signed)
-----   Message from Valrie Hart, RN sent at 11/18/2016  4:14 PM EST ----- Regarding: referral  Hi Dr. Elsworth Soho I received referral on Ms. Aramburo from her PCP.  She looks to have an abnormal scan but no tissue DX.  I was not sure if you wanted to see her before we do?  Thanks Hinton Dyer

## 2016-11-19 NOTE — Telephone Encounter (Signed)
Dr. Elsworth Soho, the 10am spot for 1/25 had already been taken. Pt is scheduled for 1/26 at 915.

## 2016-11-20 DIAGNOSIS — H6523 Chronic serous otitis media, bilateral: Secondary | ICD-10-CM | POA: Diagnosis not present

## 2016-11-20 DIAGNOSIS — H6123 Impacted cerumen, bilateral: Secondary | ICD-10-CM | POA: Diagnosis not present

## 2016-11-20 DIAGNOSIS — J018 Other acute sinusitis: Secondary | ICD-10-CM | POA: Diagnosis not present

## 2016-11-22 ENCOUNTER — Encounter: Payer: Self-pay | Admitting: Pulmonary Disease

## 2016-11-22 ENCOUNTER — Ambulatory Visit (INDEPENDENT_AMBULATORY_CARE_PROVIDER_SITE_OTHER): Payer: Medicare Other | Admitting: Pulmonary Disease

## 2016-11-22 VITALS — BP 116/70 | HR 83 | Ht 65.0 in | Wt 200.0 lb

## 2016-11-22 DIAGNOSIS — R911 Solitary pulmonary nodule: Secondary | ICD-10-CM | POA: Diagnosis not present

## 2016-11-22 DIAGNOSIS — E041 Nontoxic single thyroid nodule: Secondary | ICD-10-CM

## 2016-11-22 NOTE — Progress Notes (Signed)
   Subjective:    Patient ID: Grace Barajas, female    DOB: 08/07/1966, 51 y.o.   MRN: [id number]  HPI  51 yo with Moderate OSA on auto CPAP, average 10 cm She also has episodic dyspnea and right lower lobe nodule on CT scan She has generalized anxiety disorder and takes Prozac and perphenazine s   11/22/2016  Chief Complaint  Patient presents with  . Follow-up    for pulmonary nodules.    She was being followed for OSA and complained of episodic dyspnea at her last visit Spirometry was normal and she was given a flu shot. Right after this she developed chest congestion and had an ER visit on 09/26/16, chest x-ray showed left upper lobe airspace disease she was initially treated with Z-Pak and then with another antibiotic before she finally felt better. She had another episode of dyspnea and another ER visit on 11/10/16-this time she underwent a CT angiogram that was negative for pulmonary embolism but picked up a right lower lobe 10 mm nodule. There were 2 thyroid nodules 10 mm calcified left lobe and 8 mm noncalcified left lobe and possible adrenal nodule was noted measuring 1.1 cm in the left.  She has considerable anxiety around these findings She is a lifetime never smoker  We reviewed these images and discussed possibilities She reports sinus congestion but is not coughing and her dyspnea is resolved    TEST  NPSG 06/2014: AHI 15/hr, desat 89% 06/2015 AHI 16   Spirometry 08/2016 was a poor effort but showed normal lung function without airway obstruction    Past Medical History:  Diagnosis Date  . Anxiety   . Depression   . High cholesterol   . Pneumonia   . Sinus disorder   . Snores   . Wears glasses     Review of Systems neg for any significant sore throat, dysphagia, itching, sneezing, nasal congestion or excess/ purulent secretions, fever, chills, sweats, unintended wt loss, pleuritic or exertional cp, hempoptysis, orthopnea pnd or change in chronic  leg swelling. Also denies presyncope, palpitations, heartburn, abdominal pain, nausea, vomiting, diarrhea or change in bowel or urinary habits, dysuria,hematuria, rash, arthralgias, visual complaints, headache, numbness weakness or ataxia.     Objective:   Physical Exam  Gen. Pleasant, well-nourished, in no distress ENT - no lesions, no post nasal drip Neck: No JVD, no thyromegaly, no carotid bruits Lungs: no use of accessory muscles, no dullness to percussion, clear without rales or rhonchi  Cardiovascular: Rhythm regular, heart sounds  normal, no murmurs or gallops, no peripheral edema Musculoskeletal: No deformities, no cyanosis or clubbing        Assessment & Plan:

## 2016-11-22 NOTE — Patient Instructions (Signed)
You have a 10 mm nodule in your right lung, 2 nodules in your thyroid and one on your adrenal gland We discussed possibilities including infection and cancer  We will schedule ultrasound of her thyroid gland Repeat CT scan no contrast in 3 months and follow up after this

## 2016-11-22 NOTE — Assessment & Plan Note (Signed)
We will schedule ultrasound of her thyroid gland And refer her to endocrine if necessary

## 2016-11-22 NOTE — Assessment & Plan Note (Addendum)
You have a 10 mm nodule in your right lung, 2 nodules in your thyroid and one on your adrenal gland We discussed possibilities including infection and cancer-low probability of lung malignancy in this never smoker  PET scan at this time would not help differentiate between infection and cancer hence we'll defer  Repeat CT scan no contrast in 3 months and follow up after this  Needs age-appropriate cancer screening including mammogram

## 2016-11-26 DIAGNOSIS — R3129 Other microscopic hematuria: Secondary | ICD-10-CM | POA: Diagnosis not present

## 2016-11-26 DIAGNOSIS — R310 Gross hematuria: Secondary | ICD-10-CM | POA: Diagnosis not present

## 2016-11-27 ENCOUNTER — Ambulatory Visit (HOSPITAL_BASED_OUTPATIENT_CLINIC_OR_DEPARTMENT_OTHER): Payer: Medicare Other | Admitting: Internal Medicine

## 2016-11-27 ENCOUNTER — Other Ambulatory Visit (HOSPITAL_BASED_OUTPATIENT_CLINIC_OR_DEPARTMENT_OTHER): Payer: Medicare Other

## 2016-11-27 ENCOUNTER — Encounter: Payer: Self-pay | Admitting: Internal Medicine

## 2016-11-27 VITALS — BP 116/73 | HR 93 | Temp 98.4°F | Resp 18 | Ht 65.0 in | Wt 200.0 lb

## 2016-11-27 DIAGNOSIS — E041 Nontoxic single thyroid nodule: Secondary | ICD-10-CM

## 2016-11-27 DIAGNOSIS — R911 Solitary pulmonary nodule: Secondary | ICD-10-CM

## 2016-11-27 DIAGNOSIS — R918 Other nonspecific abnormal finding of lung field: Secondary | ICD-10-CM

## 2016-11-27 LAB — CBC WITH DIFFERENTIAL/PLATELET
BASO%: 0.8 % (ref 0.0–2.0)
BASOS ABS: 0.1 10*3/uL (ref 0.0–0.1)
EOS ABS: 0.4 10*3/uL (ref 0.0–0.5)
EOS%: 5.5 % (ref 0.0–7.0)
HCT: 39.5 % (ref 34.8–46.6)
HGB: 13.5 g/dL (ref 11.6–15.9)
LYMPH%: 38.2 % (ref 14.0–49.7)
MCH: 29.6 pg (ref 25.1–34.0)
MCHC: 34.1 g/dL (ref 31.5–36.0)
MCV: 86.8 fL (ref 79.5–101.0)
MONO#: 0.5 10*3/uL (ref 0.1–0.9)
MONO%: 5.8 % (ref 0.0–14.0)
NEUT#: 3.9 10*3/uL (ref 1.5–6.5)
NEUT%: 49.7 % (ref 38.4–76.8)
Platelets: 424 10*3/uL — ABNORMAL HIGH (ref 145–400)
RBC: 4.55 10*6/uL (ref 3.70–5.45)
RDW: 14.4 % (ref 11.2–14.5)
WBC: 7.9 10*3/uL (ref 3.9–10.3)
lymph#: 3 10*3/uL (ref 0.9–3.3)

## 2016-11-27 LAB — COMPREHENSIVE METABOLIC PANEL
ALBUMIN: 4.2 g/dL (ref 3.5–5.0)
ALK PHOS: 94 U/L (ref 40–150)
ALT: 20 U/L (ref 0–55)
AST: 12 U/L (ref 5–34)
Anion Gap: 10 mEq/L (ref 3–11)
BUN: 13.7 mg/dL (ref 7.0–26.0)
CHLORIDE: 104 meq/L (ref 98–109)
CO2: 30 mEq/L — ABNORMAL HIGH (ref 22–29)
Calcium: 10.7 mg/dL — ABNORMAL HIGH (ref 8.4–10.4)
Creatinine: 0.8 mg/dL (ref 0.6–1.1)
GLUCOSE: 128 mg/dL (ref 70–140)
POTASSIUM: 3.9 meq/L (ref 3.5–5.1)
SODIUM: 145 meq/L (ref 136–145)
Total Bilirubin: 0.47 mg/dL (ref 0.20–1.20)
Total Protein: 8 g/dL (ref 6.4–8.3)

## 2016-11-27 NOTE — Progress Notes (Signed)
[place] [phone]   [phone]  CONSULT NOTE  REFERRING PHYSICIAN: Evelina Dun, FNP  REASON FOR CONSULTATION:  51 years old African-American female with suspicious lung nodule.  HPI Grace Barajas is a 51 y.o. female a never smoker with past medical history significant for anxiety, depression, dyslipidemia and recurrent sinus infection. The patient also has chronic low back pain. 2 weeks ago she presented to the emergency Department complaining of cough, chest congestion and shortness of breath 1 week after flu vaccine. CT angiogram of the chest was performed in 11/10/2016 and it showed 1.0 x 0.7 x 0.9 cm right lower lobe nodule. This scan also showed 1.0 cm calcified left lobe thyroid nodule and 0.8 cm noncalcified left lobe thyroid nodule. Scan of the upper abdomen showed a questionable small left adrenal nodule measuring 1.1 x 1.1 cm. The patient was seen by Dr. Elsworth Soho and he recommended for her follow-up visit with repeat scan in 3 months. Her primary care physician was concerned about the several findings and she requested referral to oncology for evaluation of her condition. The patient had CT scan of the abdomen performed yesterday that showed no masses in the adrenal gland. She is also scheduled for ultrasound of the thyroid for evaluation of the thyroid nodule. Dr. Elsworth Soho scheduled the patient for high-resolution CT scan of the chest tomorrow. When seen today the patient is feeling fine with no specific complaints except for mild cough and recurrent sinus infection. She was also seen by ENT and she is currently on prednisone for inflammatory process in the ear. She denied having any chest pain, shortness breath or hemoptysis. She has no significant weight loss or night sweats. She has no nausea, vomiting, diarrhea or constipation. She has no headache or visual changes. Family history significant for father with diabetes and hypertension. Mother  is healthy. The patient is single and has 3 children. She is currently on disability. She denied having any history of smoking but drinks alcohol occasionally and no history of drug abuse.  HPI  Past Medical History:  Diagnosis Date  . Anxiety   . Depression   . High cholesterol   . Pneumonia   . Sinus disorder   . Snores   . Wears glasses     Past Surgical History:  Procedure Laterality Date  . ABDOMINAL HYSTERECTOMY    . BILATERAL SALPINGECTOMY Bilateral 01/21/2013   Procedure: BILATERAL SALPINGECTOMY;  Surgeon: Marvene Staff, MD;  Location: [facility];  Service: Gynecology;  Laterality: Bilateral;  . CARPAL TUNNEL RELEASE Left 07/03/2007  . CARPAL TUNNEL RELEASE Right 07/31/2007  . CERVICAL FUSION     C1/C2  . DORSAL COMPARTMENT RELEASE Bilateral 01/13/2014   Procedure: BILATERAL 1ST DORSAL COMPARTMENT RELEASES;  Surgeon: Cammie Sickle., MD;  Location: [facility];  Service: Orthopedics;  Laterality: Bilateral;  bilateral  . ROBOTIC ASSISTED TOTAL HYSTERECTOMY N/A 01/21/2013   Procedure: ROBOTIC ASSISTED TOTAL HYSTERECTOMY;  Surgeon: Marvene Staff, MD;  Location: [facility];  Service: Gynecology;  Laterality: N/A;  . TUBAL LIGATION      Family History  Problem Relation Age of Onset  . Asthma Son   . Asthma Brother     Social History Social History  Substance Use Topics  . Smoking status: Never Smoker  . Smokeless tobacco: Never Used  . Alcohol use Yes     Comment: social    Allergies  Allergen Reactions  . Buprenorphine Hcl     "  Felt like an addict needing a fix."  . Morphine And Related     "Felt like an addict needing a fix."    Current Outpatient Prescriptions  Medication Sig Dispense Refill  . Albuterol Sulfate 108 (90 Base) MCG/ACT AEPB Inhale into the lungs.    . Biotin 5 MG TABS Take by mouth.    . cetirizine (ZYRTEC) 10 MG tablet Take 10 mg by mouth daily.    . ferrous sulfate 325 (65 FE) MG tablet Take 325 mg by mouth daily  with breakfast.    . FLUoxetine (PROZAC) 10 MG capsule Take 20 mg by mouth daily.     . fluticasone (FLONASE) 50 MCG/ACT nasal spray 1 spray by Both Nostrils route daily.    . Multiple Vitamins-Minerals (MULTIVITAMIN WITH MINERALS) tablet Take 1 tablet by mouth daily.    . Omega-3 Fatty Acids (FISH OIL) 1000 MG CAPS Take 1 capsule by mouth daily.     Marland Kitchen perphenazine (TRILAFON) 4 MG tablet Take 4 mg by mouth at bedtime.    . SYMBICORT 160-4.5 MCG/ACT inhaler Inhale 1 puff into the lungs 2 (two) times daily.     No current facility-administered medications for this visit.     Review of Systems  Constitutional: negative Eyes: negative Ears, nose, mouth, throat, and face: positive for Sinusitis Respiratory: positive for cough Cardiovascular: negative Gastrointestinal: negative Genitourinary:negative Integument/breast: negative Hematologic/lymphatic: negative Musculoskeletal:positive for back pain Neurological: negative Behavioral/Psych: negative Endocrine: negative Allergic/Immunologic: negative  Physical Exam  [id number], healthy, no distress, well nourished and well developed SKIN: skin color, texture, turgor are normal, no rashes or significant lesions HEAD: Normocephalic, No masses, lesions, tenderness or abnormalities EYES: normal, PERRLA, Conjunctiva are pink and non-injected EARS: External ears normal, Canals clear OROPHARYNX:no exudate, no erythema and lips, buccal mucosa, and tongue normal  NECK: supple, no adenopathy, no JVD LYMPH:  no palpable lymphadenopathy, no hepatosplenomegaly BREAST:not examined LUNGS: clear to auscultation , and palpation HEART: regular rate & rhythm, no murmurs and no gallops ABDOMEN:abdomen soft, non-tender, normal bowel sounds and no masses or organomegaly BACK: Back symmetric, no curvature., No CVA tenderness EXTREMITIES:no joint deformities, effusion, or inflammation, no edema, no skin discoloration  NEURO: alert & oriented x 3 with fluent  speech, no focal motor/sensory deficits  PERFORMANCE STATUS: ECOG 1  LABORATORY DATA: Lab Results  Component Value Date   WBC 7.9 11/27/2016   HGB 13.5 11/27/2016   HCT 39.5 11/27/2016   MCV 86.8 11/27/2016   PLT 424 (H) 11/27/2016      Chemistry      Component Value Date/Time   NA 145 11/27/2016 1402   K 3.9 11/27/2016 1402   CL 106 11/10/2016 0640   CO2 30 (H) 11/27/2016 1402   BUN 13.7 11/27/2016 1402   CREATININE 0.8 11/27/2016 1402      Component Value Date/Time   CALCIUM 10.7 (H) 11/27/2016 1402   ALKPHOS 94 11/27/2016 1402   AST 12 11/27/2016 1402   ALT 20 11/27/2016 1402   BILITOT 0.47 11/27/2016 1402       RADIOGRAPHIC STUDIES: Dg Chest 2 View  Result Date: 11/10/2016 CLINICAL DATA:  Productive cough. Congestion. Shortness of breath. Recent pneumonia. EXAM: CHEST  2 VIEW COMPARISON:  Two-view chest x-ray 10/15/2016. FINDINGS: Heart size is normal. Lungs are clear. Lung volumes are low. The visualized soft tissues and bony thorax are unremarkable. IMPRESSION: Negative two view chest x-ray Electronically Signed   By: San Morelle M.D.   On: 11/10/2016 07:17   Ct  Angio Chest Pe W And/or Wo Contrast  Result Date: 11/10/2016 CLINICAL DATA:  Cough, chest congestion and shortness breath for the past 2 months since receiving a flu shot. EXAM: CT ANGIOGRAPHY CHEST WITH CONTRAST TECHNIQUE: Multidetector CT imaging of the chest was performed using the standard protocol during bolus administration of intravenous contrast. Multiplanar CT image reconstructions and MIPs were obtained to evaluate the vascular anatomy. CONTRAST:  100 cc Isovue 370 COMPARISON:  Chest radiographs dated 11/10/2016. FINDINGS: Cardiovascular: Satisfactory opacification of the pulmonary arteries to the segmental level. No evidence of pulmonary embolism. Normal heart size. No pericardial effusion. Mediastinum/Nodes: 10 mm densely calcified left lobe thyroid nodule and 8 mm noncalcified left lobe  thyroid nodule. No enlarged lymph nodes. Lungs/Pleura: 10 x 7 mm right lower lobe nodule on image number 58 of series 5. This measures 9 mm in length on sagittal image number 64. No airspace consolidation or pleural fluid. Upper Abdomen: Mild diffuse low density of the liver. Possible small left adrenal nodule on the last axial image number 93 of series 4, measuring 1.1 x 1.1 cm. Musculoskeletal: Thoracic spine degenerative changes. No evidence of bony metastatic disease. Review of the MIP images confirms the above findings. IMPRESSION: 1. 9 mm average diameter right lower lobe lung nodule. This is nonspecific, with a small lung carcinoma in the differential. Consider one of the following in 3 months for both low-risk and high-risk individuals: (a) repeat chest CT, (b) follow-up PET-CT, or (c) tissue sampling. This recommendation follows the consensus statement: Guidelines for Management of Incidental Pulmonary Nodules Detected on CT Images: From the Fleischner Society 2017; Radiology 2017; 284:228-243. 2. Possible small left adrenal mass on the last axial image, measuring 1.1 x 1.1 cm. This could represent a small adenoma or metastasis. 3. No pulmonary emboli. 4. 10 mm and 8 mm left lobe thyroid nodules. Consider further evaluation with thyroid ultrasound. If patient is clinically hyperthyroid, consider nuclear medicine thyroid uptake and scan. 5. Mild diffuse hepatic steatosis. Electronically Signed   By: Claudie Revering M.D.   On: 11/10/2016 08:50    ASSESSMENT: This is a very pleasant 51 years old African-American female with a suspicious right lower lobe nodule, likely inflammatory in origin but malignancy cannot be excluded. The patient also has 2 nodules in the left lobe of the thyroid gland.  PLAN: I had a lengthy discussion with the patient about her condition. I explained to the patient that the right lower lobe pulmonary nodule could be inflammatory in origin but malignancy cannot be excluded. She is  scheduled to have repeat CT scan of the chest tomorrow by Dr. Elsworth Soho. If no resolution of this pulmonary nodule, the patient may benefit from consideration of biopsy either with electromagnetic navigational bronchoscopy or repeat CT scan of the chest in 2-3 months for reevaluation. For the thyroid nodule, the patient is scheduled for ultrasound of the thyroid gland tomorrow for evaluation of this lesion and she may benefit from fine needle aspiration and suspicious. She is followed by Dr. Elsworth Soho and I recommended for her to continue her routine follow-up visit with him. I will see her back for evaluation if she has a diagnosis of malignancy by Dr. Elsworth Soho. She was advised to call if she has any concerning symptoms. The patient voices understanding of current disease status and treatment options and is in agreement with the current care plan.  All questions were answered. The patient knows to call the clinic with any problems, questions or concerns. We can certainly see  the patient much sooner if necessary.  Thank you so much for allowing me to participate in the care of Grace Barajas. I will continue to follow up the patient with you and assist in her care.  I spent 40 minutes counseling the patient face to face. The total time spent in the appointment was 60 minutes.  Disclaimer: This note was dictated with voice recognition software. Similar sounding words can inadvertently be transcribed and may not be corrected upon review.   Fatiha Guzy K. November 27, 2016, 2:53 PM

## 2016-11-28 ENCOUNTER — Ambulatory Visit (HOSPITAL_COMMUNITY)
Admission: RE | Admit: 2016-11-28 | Discharge: 2016-11-28 | Disposition: A | Discharge status: Home / Self Care | Payer: Medicare Other | Source: Ambulatory Visit | Attending: Pulmonary Disease | Admitting: Pulmonary Disease

## 2016-11-28 DIAGNOSIS — R918 Other nonspecific abnormal finding of lung field: Secondary | ICD-10-CM | POA: Diagnosis not present

## 2016-11-28 DIAGNOSIS — E041 Nontoxic single thyroid nodule: Secondary | ICD-10-CM

## 2016-11-28 DIAGNOSIS — E042 Nontoxic multinodular goiter: Secondary | ICD-10-CM | POA: Insufficient documentation

## 2016-11-28 DIAGNOSIS — R911 Solitary pulmonary nodule: Secondary | ICD-10-CM

## 2016-11-29 ENCOUNTER — Telehealth: Payer: Self-pay | Admitting: Pulmonary Disease

## 2016-11-29 DIAGNOSIS — E041 Nontoxic single thyroid nodule: Secondary | ICD-10-CM

## 2016-11-29 NOTE — Telephone Encounter (Signed)
Notes Recorded by Rigoberto Noel, MD on 11/29/2016 at 8:30 AM EST Nodule may need biopsy Pl refer to [facility] for thyroid nodule    Called and spoke with pt and she is aware of order that has been placed to get pt set up with [facility] endocrinology.  Pt is aware and nothing further is needed.

## 2016-12-02 DIAGNOSIS — R310 Gross hematuria: Secondary | ICD-10-CM | POA: Diagnosis not present

## 2016-12-02 DIAGNOSIS — R350 Frequency of micturition: Secondary | ICD-10-CM | POA: Diagnosis not present

## 2016-12-04 DIAGNOSIS — J018 Other acute sinusitis: Secondary | ICD-10-CM | POA: Diagnosis not present

## 2016-12-04 DIAGNOSIS — H906 Mixed conductive and sensorineural hearing loss, bilateral: Secondary | ICD-10-CM | POA: Diagnosis not present

## 2016-12-04 DIAGNOSIS — H6523 Chronic serous otitis media, bilateral: Secondary | ICD-10-CM | POA: Diagnosis not present

## 2016-12-05 ENCOUNTER — Telehealth: Payer: Self-pay | Admitting: Pulmonary Disease

## 2016-12-05 NOTE — Telephone Encounter (Signed)
Spoke with pt. States that she was just contacted by Dr. Cordelia Pen office for her appointment. Nothing further was needed.

## 2016-12-06 ENCOUNTER — Encounter: Payer: Self-pay | Admitting: Endocrinology

## 2016-12-06 ENCOUNTER — Ambulatory Visit (INDEPENDENT_AMBULATORY_CARE_PROVIDER_SITE_OTHER): Payer: Medicare Other | Admitting: Endocrinology

## 2016-12-06 DIAGNOSIS — E042 Nontoxic multinodular goiter: Secondary | ICD-10-CM | POA: Insufficient documentation

## 2016-12-06 NOTE — Patient Instructions (Signed)
Let's check the biopsy.  It is easy.  you will receive a phone call, about a day and time for an appointment. If as expected, no cancer is found, Please come back for a follow-up appointment in 6-12 months.   If the result says you need to have it removed, Dr Lucia Gaskins would be happy to do that for you.

## 2016-12-06 NOTE — Progress Notes (Signed)
Subjective:    Patient ID: Grace Barajas, female    DOB: [birth year], 51 y.o.   MRN: [id number]  HPI Pt is referred by Dr Elsworth Soho, for nodular thyroid.  Pt was incidentally noted to have a nodule at the thyroid in early 2018.  she is unaware of ever having had thyroid problems in the past.  she has no h/o XRT or surgery to the thyroid, but she had C-spinal surgery, via anterior approach.  She has moderate congestion in the nose, but no assoc neck pain.  Past Medical History:  Diagnosis Date  . Anxiety   . Depression   . High cholesterol   . Pneumonia   . Sinus disorder   . Snores   . Wears glasses     Past Surgical History:  Procedure Laterality Date  . ABDOMINAL HYSTERECTOMY    . BILATERAL SALPINGECTOMY Bilateral 01/21/2013   Procedure: BILATERAL SALPINGECTOMY;  Surgeon: Marvene Staff, MD;  Location: [facility];  Service: Gynecology;  Laterality: Bilateral;  . CARPAL TUNNEL RELEASE Left 07/03/2007  . CARPAL TUNNEL RELEASE Right 07/31/2007  . CERVICAL FUSION     C1/C2  . DORSAL COMPARTMENT RELEASE Bilateral 01/13/2014   Procedure: BILATERAL 1ST DORSAL COMPARTMENT RELEASES;  Surgeon: Cammie Sickle., MD;  Location: [facility];  Service: Orthopedics;  Laterality: Bilateral;  bilateral  . ROBOTIC ASSISTED TOTAL HYSTERECTOMY N/A 01/21/2013   Procedure: ROBOTIC ASSISTED TOTAL HYSTERECTOMY;  Surgeon: Marvene Staff, MD;  Location: [facility];  Service: Gynecology;  Laterality: N/A;  . TUBAL LIGATION      Social History   Social History  . Marital status: Single    Spouse name: N/A  . Number of children: 3  . Years of education: N/A   Occupational History  . disabled    Social History Main Topics  . Smoking status: Never Smoker  . Smokeless tobacco: Never Used  . Alcohol use Yes     Comment: social  . Drug use: No  . Sexual activity: Not on file   Other Topics Concern  . Not on file   Social History Narrative  . No narrative on file    Current  Outpatient Prescriptions on File Prior to Visit  Medication Sig Dispense Refill  . Biotin 5 MG TABS Take by mouth.    . cetirizine (ZYRTEC) 10 MG tablet Take 10 mg by mouth daily.    . ferrous sulfate 325 (65 FE) MG tablet Take 325 mg by mouth daily with breakfast.    . FLUoxetine (PROZAC) 10 MG capsule Take 20 mg by mouth daily.     . Multiple Vitamins-Minerals (MULTIVITAMIN WITH MINERALS) tablet Take 1 tablet by mouth daily.    . Omega-3 Fatty Acids (FISH OIL) 1000 MG CAPS Take 1 capsule by mouth daily.     Marland Kitchen perphenazine (TRILAFON) 4 MG tablet Take 4 mg by mouth at bedtime.    . SYMBICORT 160-4.5 MCG/ACT inhaler Inhale 1 puff into the lungs 2 (two) times daily.    . Albuterol Sulfate 108 (90 Base) MCG/ACT AEPB Inhale into the lungs.    . fluticasone (FLONASE) 50 MCG/ACT nasal spray 1 spray by Both Nostrils route daily.     No current facility-administered medications on file prior to visit.     Allergies  Allergen Reactions  . Buprenorphine Hcl     "Felt like an addict needing a fix."  . Morphine And Related     "Felt like an addict needing a  fix."   Family History  Problem Relation Age of Onset  . Asthma Son   . Asthma Brother   . Thyroid disease Neg Hx    BP 122/84   Pulse 68   Ht 5\' 5"  (1.651 m)   LMP 01/11/2013   SpO2 98%   Review of Systems Denies weight change, hoarseness, visual loss, chest pain, sob, dysphagia, diarrhea, itching, easy bruising, cold intolerance, headache, and numbness.  She has flushing sxs, which she attributes to menopause.  She has chronic rhinorrhea, anxiety, and depression    Objective:   Physical Exam VS: see vs page.  GEN: no distress.  HEAD: head: no deformity.  eyes: no periorbital swelling, no proptosis.  external nose and ears are normal.   mouth: no lesion seen NECK: supple, thyroid is not enlarged.   CHEST WALL: no deformity LUNGS: clear to auscultation CV: reg rate and rhythm, no murmur ABD: abdomen is soft, nontender.  no  hepatosplenomegaly.  not distended.  no hernia MUSCULOSKELETAL: muscle bulk and strength are grossly normal.  no obvious joint swelling.  gait is normal and steady EXTEMITIES: no deformity.  no edema PULSES: no carotid bruit NEURO:  cn 2-12 grossly intact.   readily moves all 4's.  sensation is intact to touch on all 4's. SKIN:  Normal texture and temperature.  No rash or suspicious lesion is visible.   NODES:  None palpable at the neck.   PSYCH: alert, well-oriented.  Does not appear anxious nor depressed.   Lab Results  Component Value Date   TSH 3.700 01/23/2016   T4TOTAL 8.3 01/23/2016   [place]: indeterminate nodule in the mid right thyroid lobe, which meets criteria for ultrasound-guided biopsy  Chest CT: 10 mm and 8 mm left lobe thyroid nodules  I have reviewed outside records, and summarized: Pt was noted to have multinodular goiter, and referred here.  She was found to have OSA and GAD.     Assessment & Plan:  Multinodular goiter, new to me: uncertain etiology. Nasal congestion: I told pt this is not thyroid-related.  Patient is advised the following: Patient Instructions  Let's check the biopsy.  It is easy.  you will receive a phone call, about a day and time for an appointment. If as expected, no cancer is found, Please come back for a follow-up appointment in 6-12 months.   If the result says you need to have it removed, Dr Lucia Gaskins would be happy to do that for you.

## 2016-12-19 ENCOUNTER — Other Ambulatory Visit (HOSPITAL_COMMUNITY)
Admission: RE | Admit: 2016-12-19 | Discharge: 2016-12-19 | Disposition: A | Discharge status: Home / Self Care | Payer: Medicare Other | Source: Ambulatory Visit | Attending: Radiology | Admitting: Radiology

## 2016-12-19 ENCOUNTER — Ambulatory Visit
Admission: RE | Admit: 2016-12-19 | Discharge: 2016-12-19 | Disposition: A | Discharge status: Home / Self Care | Payer: Medicare Other | Source: Ambulatory Visit | Attending: Endocrinology | Admitting: Endocrinology

## 2016-12-19 DIAGNOSIS — E041 Nontoxic single thyroid nodule: Secondary | ICD-10-CM | POA: Insufficient documentation

## 2016-12-19 DIAGNOSIS — E042 Nontoxic multinodular goiter: Secondary | ICD-10-CM

## 2016-12-23 ENCOUNTER — Telehealth: Payer: Self-pay | Admitting: Endocrinology

## 2016-12-23 NOTE — Telephone Encounter (Signed)
See message and please advise, Thanks!  

## 2016-12-23 NOTE — Telephone Encounter (Signed)
I did result over the weekend.

## 2016-12-23 NOTE — Telephone Encounter (Signed)
please call patient:  No cancer is seen--good  Please come back for a follow-up appointment in 6-12 months. Patient advised of message and had no further questions at this time. Patient will call back to schedule her appointment.

## 2016-12-23 NOTE — Telephone Encounter (Signed)
Patient is calling for the result of her biospy

## 2016-12-25 DIAGNOSIS — J018 Other acute sinusitis: Secondary | ICD-10-CM | POA: Diagnosis not present

## 2016-12-31 ENCOUNTER — Encounter: Payer: Self-pay | Admitting: Family

## 2016-12-31 ENCOUNTER — Ambulatory Visit (INDEPENDENT_AMBULATORY_CARE_PROVIDER_SITE_OTHER): Payer: Medicare Other | Admitting: Family

## 2016-12-31 VITALS — BP 108/66 | HR 78 | Temp 98.3°F | Ht 65.0 in | Wt 209.0 lb

## 2016-12-31 DIAGNOSIS — H109 Unspecified conjunctivitis: Secondary | ICD-10-CM | POA: Diagnosis not present

## 2016-12-31 MED ORDER — POLYMYXIN B-TRIMETHOPRIM 10000-0.1 UNIT/ML-% OP SOLN: 2.0000 [drp] | OPHTHALMIC | 0 refills | Status: DC

## 2016-12-31 NOTE — Patient Instructions (Signed)

## 2016-12-31 NOTE — Progress Notes (Signed)
   Subjective:    Patient ID: Grace Barajas, female    DOB: 1966-08-27, 51 y.o.   MRN: [id number]  Pt presents to the office with bilateral conjunctivitis. Pt states she has been to ENT several times and has tried Augmentin X2 with no relief.  Conjunctivitis   The current episode started more than 1 week ago. The onset was gradual. The problem occurs continuously. The problem has been unchanged. The problem is moderate. The symptoms are relieved by one or more OTC medications. Associated symptoms include decreased vision, eye itching ("little"), rhinorrhea and eye redness. Pertinent negatives include no double vision, no photophobia, no headaches, no sore throat, no eye discharge and no eye pain.      Review of Systems  HENT: Positive for rhinorrhea. Negative for sore throat.   Eyes: Positive for redness and itching ("little"). Negative for double vision, photophobia, pain and discharge.  Neurological: Negative for headaches.  All other systems reviewed and are negative.      Objective:   Physical Exam  Constitutional: She is oriented to person, place, and time. She appears well-developed and well-nourished. No distress.  HENT:  Head: Normocephalic and atraumatic.  Right Ear: External ear normal.  Left Ear: External ear normal.  Mouth/Throat: Oropharynx is clear and moist.  Eyes: Pupils are equal, round, and reactive to light. Right eye exhibits discharge. Left eye exhibits discharge. Right conjunctiva has a hemorrhage. Left conjunctiva has a hemorrhage.  Neck: Normal range of motion. Neck supple. No thyromegaly present.  Cardiovascular: Normal rate, regular rhythm, normal heart sounds and intact distal pulses.   No murmur heard. Pulmonary/Chest: Effort normal and breath sounds normal. No respiratory distress. She has no wheezes.  Abdominal: Soft. Bowel sounds are normal. She exhibits no distension. There is no tenderness.  Musculoskeletal: Normal range of motion. She exhibits  no edema or tenderness.  Neurological: She is alert and oriented to person, place, and time. She has normal reflexes. No cranial nerve deficit.  Skin: Skin is warm and dry.  Psychiatric: She has a normal mood and affect. Her behavior is normal. Judgment and thought content normal.  Vitals reviewed.    BP 108/66   Pulse 78   Temp 98.3 F (36.8 C) (Oral)   Ht 5\' 5"  (1.651 m)   Wt 209 lb (94.8 kg)   LMP 01/11/2013   BMI 34.78 kg/m       Assessment & Plan:  1. Bacterial conjunctivitis of both eyes -Do not rub eyes -Warm compresses -Good hand hygiene discussed -RTO prn  - trimethoprim-polymyxin b (POLYTRIM) ophthalmic solution; Place 2 drops into both eyes every 4 (four) hours.  Dispense: 10 mL; Refill: 0   Evelina Dun, FNP

## 2017-01-06 ENCOUNTER — Other Ambulatory Visit: Payer: Self-pay

## 2017-01-06 DIAGNOSIS — R911 Solitary pulmonary nodule: Secondary | ICD-10-CM

## 2017-01-07 ENCOUNTER — Encounter: Payer: Medicare Other | Admitting: Gastroenterology

## 2017-01-16 DIAGNOSIS — J329 Chronic sinusitis, unspecified: Secondary | ICD-10-CM | POA: Diagnosis not present

## 2017-01-16 DIAGNOSIS — J309 Allergic rhinitis, unspecified: Secondary | ICD-10-CM | POA: Diagnosis not present

## 2017-01-22 DIAGNOSIS — J342 Deviated nasal septum: Secondary | ICD-10-CM | POA: Diagnosis not present

## 2017-01-22 DIAGNOSIS — J343 Hypertrophy of nasal turbinates: Secondary | ICD-10-CM | POA: Diagnosis not present

## 2017-01-22 DIAGNOSIS — J3489 Other specified disorders of nose and nasal sinuses: Secondary | ICD-10-CM | POA: Diagnosis not present

## 2017-01-22 DIAGNOSIS — M8500 Fibrous dysplasia (monostotic), unspecified site: Secondary | ICD-10-CM | POA: Diagnosis not present

## 2017-01-22 DIAGNOSIS — J328 Other chronic sinusitis: Secondary | ICD-10-CM | POA: Diagnosis not present

## 2017-02-05 ENCOUNTER — Ambulatory Visit (INDEPENDENT_AMBULATORY_CARE_PROVIDER_SITE_OTHER): Payer: Medicare Other | Admitting: Pulmonary Disease

## 2017-02-05 ENCOUNTER — Encounter: Payer: Self-pay | Admitting: Pulmonary Disease

## 2017-02-05 VITALS — BP 118/70 | HR 85 | Ht 65.0 in | Wt 207.0 lb

## 2017-02-05 DIAGNOSIS — G4733 Obstructive sleep apnea (adult) (pediatric): Secondary | ICD-10-CM

## 2017-02-05 DIAGNOSIS — J301 Allergic rhinitis due to pollen: Secondary | ICD-10-CM | POA: Diagnosis not present

## 2017-02-05 DIAGNOSIS — R911 Solitary pulmonary nodule: Secondary | ICD-10-CM | POA: Diagnosis not present

## 2017-02-05 NOTE — Assessment & Plan Note (Signed)
Okay to stay off CPAP until sinuses feel better

## 2017-02-05 NOTE — Patient Instructions (Signed)
Schedule CT chest no contrast in mid May Try storebrand Sudafed such as walphed for one week

## 2017-02-05 NOTE — Assessment & Plan Note (Signed)
Schedule CT chest no contrast in mid May

## 2017-02-05 NOTE — Assessment & Plan Note (Signed)
Try storebrand Sudafed such as walphed for one week Ct flonase & zyrtec She will FU with ENT

## 2017-02-05 NOTE — Progress Notes (Signed)
   Subjective:    Patient ID: Grace Barajas, female    DOB: [birth year], 51 y.o.   MRN: [id number]  HPI  51 yo Lifetime never smoker with Moderate OSA on auto CPAP,average 10 cm She also has episodic dyspnea and right lower lobe nodule on CT scan She has generalized anxiety disorder and takes Prozac and perphenazine    .02/05/2017  Chief Complaint  Patient presents with  . Follow-up    3 mo for lung nodule. Pt states she is still congested. Yellow nasal discharge.      She was treated for left upper lobe community acquired pneumonia in November 2017  11/10/16-CT angiogram showed  a right lower lobe 10 mm nodule. There were 2 thyroid nodules 10 mm calcified left lobe and 8 mm noncalcified left lobe and possible adrenal nodule was noted measuring 1.1 cm in the left.  She denies dyspnea on today's visit but continues to have severe sinus congestion and drainage. She is taking Flonase and Zyrtec without relief. Mucinex has not helped she has seen ENT and had tubes placed in her years. She is scheduled to see another ENT physician at [facility] and CT sinuses is planned.  She's not been able to use her CPAP machine due to sinus congestion   TEST  NPSG 06/2014: AHI 15/hr, desat 89% 06/2015 AHI 16   Spirometry 08/2016 was a poor effort but showed normal lung function without airway obstruction    Review of Systems neg for any significant sore throat, dysphagia, itching, sneezing,  fever, chills, sweats, unintended wt loss, pleuritic or exertional cp, hempoptysis, orthopnea pnd or change in chronic leg swelling. Also denies presyncope, palpitations, heartburn, abdominal pain, nausea, vomiting, diarrhea or change in bowel or urinary habits, dysuria,hematuria, rash, arthralgias, visual complaints, headache, numbness weakness or ataxia.     Objective:   Physical Exam   Gen. Pleasant, well-nourished, in no distress ENT - no thrush, no post nasal drip Neck: No JVD, no  thyromegaly, no carotid bruits Lungs: no use of accessory muscles, no dullness to percussion, clear without rales or rhonchi  Cardiovascular: Rhythm regular, heart sounds  normal, no murmurs or gallops, no peripheral edema Musculoskeletal: No deformities, no cyanosis or clubbing         Assessment & Plan:

## 2017-02-06 ENCOUNTER — Ambulatory Visit: Payer: Medicare Other | Admitting: Pulmonary Disease

## 2017-02-10 DIAGNOSIS — J3089 Other allergic rhinitis: Secondary | ICD-10-CM | POA: Diagnosis not present

## 2017-02-10 DIAGNOSIS — H1045 Other chronic allergic conjunctivitis: Secondary | ICD-10-CM | POA: Diagnosis not present

## 2017-02-19 ENCOUNTER — Encounter: Payer: Medicare Other | Admitting: Gastroenterology

## 2017-02-20 DIAGNOSIS — J329 Chronic sinusitis, unspecified: Secondary | ICD-10-CM | POA: Diagnosis not present

## 2017-02-20 DIAGNOSIS — J328 Other chronic sinusitis: Secondary | ICD-10-CM | POA: Diagnosis not present

## 2017-02-20 DIAGNOSIS — Z885 Allergy status to narcotic agent status: Secondary | ICD-10-CM | POA: Diagnosis not present

## 2017-02-20 DIAGNOSIS — J3489 Other specified disorders of nose and nasal sinuses: Secondary | ICD-10-CM | POA: Diagnosis not present

## 2017-02-20 DIAGNOSIS — M8500 Fibrous dysplasia (monostotic), unspecified site: Secondary | ICD-10-CM | POA: Diagnosis not present

## 2017-02-20 DIAGNOSIS — J343 Hypertrophy of nasal turbinates: Secondary | ICD-10-CM | POA: Diagnosis not present

## 2017-02-20 DIAGNOSIS — J342 Deviated nasal septum: Secondary | ICD-10-CM | POA: Diagnosis not present

## 2017-02-27 ENCOUNTER — Encounter: Payer: Self-pay | Admitting: Family

## 2017-02-27 ENCOUNTER — Encounter: Payer: Self-pay | Admitting: Gastroenterology

## 2017-02-27 ENCOUNTER — Ambulatory Visit (INDEPENDENT_AMBULATORY_CARE_PROVIDER_SITE_OTHER): Payer: Medicare Other | Admitting: Family

## 2017-02-27 VITALS — BP 116/71 | HR 78 | Temp 98.1°F | Ht 65.0 in | Wt 207.8 lb

## 2017-02-27 DIAGNOSIS — Z01419 Encounter for gynecological examination (general) (routine) without abnormal findings: Secondary | ICD-10-CM | POA: Diagnosis not present

## 2017-02-27 DIAGNOSIS — E781 Pure hyperglyceridemia: Secondary | ICD-10-CM

## 2017-02-27 DIAGNOSIS — G4733 Obstructive sleep apnea (adult) (pediatric): Secondary | ICD-10-CM

## 2017-02-27 DIAGNOSIS — Z0000 Encounter for general adult medical examination without abnormal findings: Secondary | ICD-10-CM | POA: Diagnosis not present

## 2017-02-27 DIAGNOSIS — E669 Obesity, unspecified: Secondary | ICD-10-CM

## 2017-02-27 DIAGNOSIS — F411 Generalized anxiety disorder: Secondary | ICD-10-CM

## 2017-02-27 DIAGNOSIS — Z1211 Encounter for screening for malignant neoplasm of colon: Secondary | ICD-10-CM

## 2017-02-27 DIAGNOSIS — F339 Major depressive disorder, recurrent, unspecified: Secondary | ICD-10-CM

## 2017-02-27 LAB — MICROSCOPIC EXAMINATION: RENAL EPITHEL UA: NONE SEEN /HPF

## 2017-02-27 LAB — URINALYSIS, COMPLETE
Bilirubin, UA: NEGATIVE
Glucose, UA: NEGATIVE
KETONES UA: NEGATIVE
NITRITE UA: NEGATIVE
PH UA: 5.5 (ref 5.0–7.5)
Protein, UA: NEGATIVE
SPEC GRAV UA: 1.015 (ref 1.005–1.030)
UUROB: 0.2 mg/dL (ref 0.2–1.0)

## 2017-02-27 NOTE — Patient Instructions (Signed)
Health Maintenance, Female Adopting a healthy lifestyle and getting preventive care can go a long way to promote health and wellness. Talk with your health care provider about what schedule of regular examinations is right for you. This is a good chance for you to check in with your provider about disease prevention and staying healthy. In between checkups, there are plenty of things you can do on your own. Experts have done a lot of research about which lifestyle changes and preventive measures are most likely to keep you healthy. Ask your health care provider for more information. Weight and diet Eat a healthy diet  Be sure to include plenty of vegetables, fruits, low-fat dairy products, and lean protein.  Do not eat a lot of foods high in solid fats, added sugars, or salt.  Get regular exercise. This is one of the most important things you can do for your health.  Most adults should exercise for at least 150 minutes each week. The exercise should increase your heart rate and make you sweat (moderate-intensity exercise).  Most adults should also do strengthening exercises at least twice a week. This is in addition to the moderate-intensity exercise. Maintain a healthy weight  Body mass index (BMI) is a measurement that can be used to identify possible weight problems. It estimates body fat based on height and weight. Your health care provider can help determine your BMI and help you achieve or maintain a healthy weight.  For females 76 years of age and older:  A BMI below 18.5 is considered underweight.  A BMI of 18.5 to 24.9 is normal.  A BMI of 25 to 29.9 is considered overweight.  A BMI of 30 and above is considered obese. Watch levels of cholesterol and blood lipids  You should start having your blood tested for lipids and cholesterol at 52 years of age, then have this test every 5 years.  You may need to have your cholesterol levels checked more often if:  Your lipid or  cholesterol levels are high.  You are older than 52 years of age.  You are at high risk for heart disease. Cancer screening Lung Cancer  Lung cancer screening is recommended for adults 64-42 years old who are at high risk for lung cancer because of a history of smoking.  A yearly low-dose CT scan of the lungs is recommended for people who:  Currently smoke.  Have quit within the past 15 years.  Have at least a 30-pack-year history of smoking. A pack year is smoking an average of one pack of cigarettes a day for 1 year.  Yearly screening should continue until it has been 15 years since you quit.  Yearly screening should stop if you develop a health problem that would prevent you from having lung cancer treatment. Breast Cancer  Practice breast self-awareness. This means understanding how your breasts normally appear and feel.  It also means doing regular breast self-exams. Let your health care provider know about any changes, no matter how small.  If you are in your 20s or 30s, you should have a clinical breast exam (CBE) by a health care provider every 1-3 years as part of a regular health exam.  If you are 34 or older, have a CBE every year. Also consider having a breast X-ray (mammogram) every year.  If you have a family history of breast cancer, talk to your health care provider about genetic screening.  If you are at high risk for breast cancer, talk  to your health care provider about having an MRI and a mammogram every year.  Breast cancer gene (BRCA) assessment is recommended for women who have family members with BRCA-related cancers. BRCA-related cancers include:  Breast.  Ovarian.  Tubal.  Peritoneal cancers.  Results of the assessment will determine the need for genetic counseling and BRCA1 and BRCA2 testing. Cervical Cancer  Your health care provider may recommend that you be screened regularly for cancer of the pelvic organs (ovaries, uterus, and vagina).  This screening involves a pelvic examination, including checking for microscopic changes to the surface of your cervix (Pap test). You may be encouraged to have this screening done every 3 years, beginning at age 24.  For women ages 66-65, health care providers may recommend pelvic exams and Pap testing every 3 years, or they may recommend the Pap and pelvic exam, combined with testing for human papilloma virus (HPV), every 5 years. Some types of HPV increase your risk of cervical cancer. Testing for HPV may also be done on women of any age with unclear Pap test results.  Other health care providers may not recommend any screening for nonpregnant women who are considered low risk for pelvic cancer and who do not have symptoms. Ask your health care provider if a screening pelvic exam is right for you.  If you have had past treatment for cervical cancer or a condition that could lead to cancer, you need Pap tests and screening for cancer for at least 20 years after your treatment. If Pap tests have been discontinued, your risk factors (such as having a new sexual partner) need to be reassessed to determine if screening should resume. Some women have medical problems that increase the chance of getting cervical cancer. In these cases, your health care provider may recommend more frequent screening and Pap tests. Colorectal Cancer  This type of cancer can be detected and often prevented.  Routine colorectal cancer screening usually begins at 52 years of age and continues through 52 years of age.  Your health care provider may recommend screening at an earlier age if you have risk factors for colon cancer.  Your health care provider may also recommend using home test kits to check for hidden blood in the stool.  A small camera at the end of a tube can be used to examine your colon directly (sigmoidoscopy or colonoscopy). This is done to check for the earliest forms of colorectal cancer.  Routine  screening usually begins at age 41.  Direct examination of the colon should be repeated every 5-10 years through 52 years of age. However, you may need to be screened more often if early forms of precancerous polyps or small growths are found. Skin Cancer  Check your skin from head to toe regularly.  Tell your health care provider about any new moles or changes in moles, especially if there is a change in a mole's shape or color.  Also tell your health care provider if you have a mole that is larger than the size of a pencil eraser.  Always use sunscreen. Apply sunscreen liberally and repeatedly throughout the day.  Protect yourself by wearing long sleeves, pants, a wide-brimmed hat, and sunglasses whenever you are outside. Heart disease, diabetes, and high blood pressure  High blood pressure causes heart disease and increases the risk of stroke. High blood pressure is more likely to develop in:  People who have blood pressure in the high end of the normal range (130-139/85-89 mm Hg).  People who are overweight or obese.  People who are African American.  If you are 59-24 years of age, have your blood pressure checked every 3-5 years. If you are 34 years of age or older, have your blood pressure checked every year. You should have your blood pressure measured twice-once when you are at a hospital or clinic, and once when you are not at a hospital or clinic. Record the average of the two measurements. To check your blood pressure when you are not at a hospital or clinic, you can use:  An automated blood pressure machine at a pharmacy.  A home blood pressure monitor.  If you are between 29 years and 60 years old, ask your health care provider if you should take aspirin to prevent strokes.  Have regular diabetes screenings. This involves taking a blood sample to check your fasting blood sugar level.  If you are at a normal weight and have a low risk for diabetes, have this test once  every three years after 52 years of age.  If you are overweight and have a high risk for diabetes, consider being tested at a younger age or more often. Preventing infection Hepatitis B  If you have a higher risk for hepatitis B, you should be screened for this virus. You are considered at high risk for hepatitis B if:  You were born in a country where hepatitis B is common. Ask your health care provider which countries are considered high risk.  Your parents were born in a high-risk country, and you have not been immunized against hepatitis B (hepatitis B vaccine).  You have HIV or AIDS.  You use needles to inject street drugs.  You live with someone who has hepatitis B.  You have had sex with someone who has hepatitis B.  You get hemodialysis treatment.  You take certain medicines for conditions, including cancer, organ transplantation, and autoimmune conditions. Hepatitis C  Blood testing is recommended for:  Everyone born from [birth year] through 1966.  Anyone with known risk factors for hepatitis C. Sexually transmitted infections (STIs)  You should be screened for sexually transmitted infections (STIs) including gonorrhea and chlamydia if:  You are sexually active and are younger than 52 years of age.  You are older than 52 years of age and your health care provider tells you that you are at risk for this type of infection.  Your sexual activity has changed since you were last screened and you are at an increased risk for chlamydia or gonorrhea. Ask your health care provider if you are at risk.  If you do not have HIV, but are at risk, it may be recommended that you take a prescription medicine daily to prevent HIV infection. This is called pre-exposure prophylaxis (PrEP). You are considered at risk if:  You are sexually active and do not regularly use condoms or know the HIV status of your partner(s).  You take drugs by injection.  You are sexually active with a partner  who has HIV. Talk with your health care provider about whether you are at high risk of being infected with HIV. If you choose to begin PrEP, you should first be tested for HIV. You should then be tested every 3 months for as long as you are taking PrEP. Pregnancy  If you are premenopausal and you may become pregnant, ask your health care provider about preconception counseling.  If you may become pregnant, take 400 to 800 micrograms (mcg) of folic acid  every day.  If you want to prevent pregnancy, talk to your health care provider about birth control (contraception). Osteoporosis and menopause  Osteoporosis is a disease in which the bones lose minerals and strength with aging. This can result in serious bone fractures. Your risk for osteoporosis can be identified using a bone density scan.  If you are 4 years of age or older, or if you are at risk for osteoporosis and fractures, ask your health care provider if you should be screened.  Ask your health care provider whether you should take a calcium or vitamin D supplement to lower your risk for osteoporosis.  Menopause may have certain physical symptoms and risks.  Hormone replacement therapy may reduce some of these symptoms and risks. Talk to your health care provider about whether hormone replacement therapy is right for you. Follow these instructions at home:  Schedule regular health, dental, and eye exams.  Stay current with your immunizations.  Do not use any tobacco products including cigarettes, chewing tobacco, or electronic cigarettes.  If you are pregnant, do not drink alcohol.  If you are breastfeeding, limit how much and how often you drink alcohol.  Limit alcohol intake to no more than 1 drink per day for nonpregnant women. One drink equals 12 ounces of beer, 5 ounces of wine, or 1 ounces of hard liquor.  Do not use street drugs.  Do not share needles.  Ask your health care provider for help if you need support  or information about quitting drugs.  Tell your health care provider if you often feel depressed.  Tell your health care provider if you have ever been abused or do not feel safe at home. This information is not intended to replace advice given to you by your health care provider. Make sure you discuss any questions you have with your health care provider. Document Released: 04/29/2011 Document Revised: 03/21/2016 Document Reviewed: 07/18/2015 Elsevier Interactive Patient Education  2017 [employer].

## 2017-02-27 NOTE — Progress Notes (Signed)
Subjective:    Patient ID: Grace Barajas, female    DOB: 08-29-1966, 51 y.o.   MRN: [id number]  Pt presents to the office today for CPE with pap. PT is followed by [facility] every month for schizo-affective  Disorder, GAD, and depression. PT has seen ENT for chronic sinusitis, but is not currently taking zyrtec, flonase, or xyzal.  Gynecologic Exam  The patient's pertinent negatives include no genital itching, genital odor, vaginal bleeding or vaginal discharge. This is a chronic problem.  Depression         This is a chronic problem.  The current episode started more than 1 year ago.   The onset quality is gradual.   The problem occurs intermittently.  Associated symptoms include irritable and sad. Hyperlipidemia  This is a chronic problem. The current episode started more than 1 year ago. Condition status: hypertriglyceridemia. Exacerbating diseases include obesity.  OSA PT has not used her CPAP since December related to her sinus issues.    Review of Systems  HENT: Positive for rhinorrhea and sinus pressure. Negative for ear discharge, ear pain, sinus pain and sneezing.   Genitourinary: Negative for vaginal discharge.  Psychiatric/Behavioral: Positive for depression.  All other systems reviewed and are negative.      Objective:   Physical Exam  Constitutional: She is oriented to person, place, and time. She appears well-developed and well-nourished. She is irritable. No distress.  HENT:  Head: Normocephalic and atraumatic.  Right Ear: External ear normal.  Left Ear: External ear normal.  Nose: Nose normal.  Mouth/Throat: Oropharynx is clear and moist.  Eyes: Pupils are equal, round, and reactive to light.  Neck: Normal range of motion. Neck supple. No thyromegaly present.  Cardiovascular: Normal rate, regular rhythm, normal heart sounds and intact distal pulses.   No murmur heard. Pulmonary/Chest: Effort normal and breath sounds normal. No respiratory distress.  She has no wheezes. Right breast exhibits no inverted nipple, no mass, no nipple discharge, no skin change and no tenderness. Left breast exhibits no inverted nipple, no mass, no nipple discharge, no skin change and no tenderness. Breasts are symmetrical.  Abdominal: Soft. Bowel sounds are normal. She exhibits no distension. There is no tenderness.  Genitourinary: Vagina normal.  Genitourinary Comments: Bimanual exam- no adnexal masses or tenderness, ovaries nonpalpable   Cervix not present- No discharge   Musculoskeletal: Normal range of motion. She exhibits no edema or tenderness.  Neurological: She is alert and oriented to person, place, and time. She has normal reflexes. No cranial nerve deficit.  Skin: Skin is warm and dry.  Psychiatric: She has a normal mood and affect. Her behavior is normal. Judgment and thought content normal.  Vitals reviewed.     BP 116/71   Pulse 78   Temp 98.1 F (36.7 C) (Oral)   Ht '5\' 5"'  (1.651 m)   Wt 207 lb 12.8 oz (94.3 kg)   LMP 01/11/2013   BMI 34.58 kg/m      Assessment & Plan:  1. Gynecologic exam normal - Urinalysis, Complete - CMP14+EGFR - CBC with Differential/Platelet - Pap IG w/ reflex to HPV when ASC-U  2. GAD (generalized anxiety disorder) - CMP14+EGFR - CBC with Differential/Platelet  3. Episode of recurrent major depressive disorder, unspecified depression episode severity ([place]) - CMP14+EGFR - CBC with Differential/Platelet  4. Obesity (BMI 30.0-34.9) - CMP14+EGFR - CBC with Differential/Platelet  5. Hypertriglyceridemia - CMP14+EGFR - CBC with Differential/Platelet - Lipid panel  6. OSA (obstructive sleep apnea) -  CMP14+EGFR - CBC with Differential/Platelet  7. Colon cancer screening - CMP14+EGFR - CBC with Differential/Platelet - Ambulatory referral to Gastroenterology - Fecal occult blood, imunochemical; Future  8. Annual physical exam - CMP14+EGFR - CBC with Differential/Platelet - Lipid panel -  Thyroid Panel With TSH - VITAMIN D 25 Hydroxy (Vit-D Deficiency, Fractures) - Pap IG w/ reflex to HPV when ASC-U - Ambulatory referral to Gastroenterology   Continue all meds Labs pending Health Maintenance reviewed Diet and exercise encouraged RTO 1 year  Evelina Dun, FNP

## 2017-02-28 ENCOUNTER — Other Ambulatory Visit: Payer: Self-pay | Admitting: Family

## 2017-02-28 DIAGNOSIS — E559 Vitamin D deficiency, unspecified: Secondary | ICD-10-CM

## 2017-02-28 LAB — CBC WITH DIFFERENTIAL/PLATELET
BASOS: 0 %
Basophils Absolute: 0 10*3/uL (ref 0.0–0.2)
EOS (ABSOLUTE): 0.1 10*3/uL (ref 0.0–0.4)
Eos: 1 %
HEMATOCRIT: 36.2 % (ref 34.0–46.6)
HEMOGLOBIN: 12.2 g/dL (ref 11.1–15.9)
IMMATURE GRANS (ABS): 0 10*3/uL (ref 0.0–0.1)
Immature Granulocytes: 0 %
Lymphocytes Absolute: 2.8 10*3/uL (ref 0.7–3.1)
Lymphs: 36 %
MCH: 29.2 pg (ref 26.6–33.0)
MCHC: 33.7 g/dL (ref 31.5–35.7)
MCV: 87 fL (ref 79–97)
MONOCYTES: 5 %
Monocytes Absolute: 0.4 10*3/uL (ref 0.1–0.9)
NEUTROS ABS: 4.4 10*3/uL (ref 1.4–7.0)
Neutrophils: 58 %
Platelets: 469 10*3/uL — ABNORMAL HIGH (ref 150–379)
RBC: 4.18 x10E6/uL (ref 3.77–5.28)
RDW: 14.4 % (ref 12.3–15.4)
WBC: 7.7 10*3/uL (ref 3.4–10.8)

## 2017-02-28 LAB — CMP14+EGFR
ALT: 14 IU/L (ref 0–32)
AST: 8 IU/L (ref 0–40)
Albumin/Globulin Ratio: 1.4 (ref 1.2–2.2)
Albumin: 4.3 g/dL (ref 3.5–5.5)
Alkaline Phosphatase: 89 IU/L (ref 39–117)
BUN / CREAT RATIO: 19 (ref 9–23)
BUN: 12 mg/dL (ref 6–24)
Bilirubin Total: 0.4 mg/dL (ref 0.0–1.2)
CALCIUM: 10.1 mg/dL (ref 8.7–10.2)
CO2: 25 mmol/L (ref 18–29)
CREATININE: 0.64 mg/dL (ref 0.57–1.00)
Chloride: 99 mmol/L (ref 96–106)
GFR calc Af Amer: 119 mL/min/{1.73_m2} (ref >59)
GFR, EST NON AFRICAN AMERICAN: 104 mL/min/{1.73_m2} (ref >59)
GLOBULIN, TOTAL: 3.1 g/dL (ref 1.5–4.5)
Glucose: 100 mg/dL — ABNORMAL HIGH (ref 65–99)
Potassium: 4.1 mmol/L (ref 3.5–5.2)
SODIUM: 140 mmol/L (ref 134–144)
Total Protein: 7.4 g/dL (ref 6.0–8.5)

## 2017-02-28 LAB — PAP IG W/ RFLX HPV ASCU: PAP Smear Comment: 0

## 2017-02-28 LAB — LIPID PANEL
CHOL/HDL RATIO: 4.2 ratio (ref 0.0–4.4)
CHOLESTEROL TOTAL: 232 mg/dL — AB (ref 100–199)
HDL: 55 mg/dL (ref >39)
LDL CALC: 149 mg/dL — AB (ref 0–99)
Triglycerides: 138 mg/dL (ref 0–149)
VLDL CHOLESTEROL CAL: 28 mg/dL (ref 5–40)

## 2017-02-28 LAB — VITAMIN D 25 HYDROXY (VIT D DEFICIENCY, FRACTURES): Vit D, 25-Hydroxy: 19.6 ng/mL — ABNORMAL LOW (ref 30.0–100.0)

## 2017-02-28 LAB — THYROID PANEL WITH TSH
Free Thyroxine Index: 2 (ref 1.2–4.9)
T3 Uptake Ratio: 25 % (ref 24–39)
T4 TOTAL: 7.9 ug/dL (ref 4.5–12.0)
TSH: 1.22 u[IU]/mL (ref 0.450–4.500)

## 2017-02-28 MED ORDER — ATORVASTATIN CALCIUM 20 MG PO TABS: 20.0000 mg | ORAL_TABLET | Freq: Every day | ORAL | 3 refills | Status: DC

## 2017-02-28 MED ORDER — VITAMIN D (ERGOCALCIFEROL) 1.25 MG (50000 UNIT) PO CAPS: 50000.0000 [IU] | ORAL_CAPSULE | ORAL | 3 refills | Status: DC

## 2017-03-18 ENCOUNTER — Ambulatory Visit (HOSPITAL_COMMUNITY)
Admission: RE | Admit: 2017-03-18 | Discharge: 2017-03-18 | Disposition: A | Discharge status: Home / Self Care | Payer: Medicare Other | Source: Ambulatory Visit | Attending: Pulmonary Disease | Admitting: Pulmonary Disease

## 2017-03-18 DIAGNOSIS — R911 Solitary pulmonary nodule: Secondary | ICD-10-CM | POA: Insufficient documentation

## 2017-03-26 ENCOUNTER — Telehealth: Payer: Self-pay | Admitting: Pulmonary Disease

## 2017-03-26 NOTE — Telephone Encounter (Signed)
CT better than MRI for lung nodule  ----- Message -----   Was the response from RA.  Pt is aware and stated that she will return in 6 months.  Will forward to [employer] to make her aware.

## 2017-04-10 DIAGNOSIS — H25013 Cortical age-related cataract, bilateral: Secondary | ICD-10-CM | POA: Diagnosis not present

## 2017-04-10 DIAGNOSIS — H11153 Pinguecula, bilateral: Secondary | ICD-10-CM | POA: Diagnosis not present

## 2017-04-10 DIAGNOSIS — H40033 Anatomical narrow angle, bilateral: Secondary | ICD-10-CM | POA: Diagnosis not present

## 2017-04-10 DIAGNOSIS — H40023 Open angle with borderline findings, high risk, bilateral: Secondary | ICD-10-CM | POA: Diagnosis not present

## 2017-04-10 DIAGNOSIS — H11423 Conjunctival edema, bilateral: Secondary | ICD-10-CM | POA: Diagnosis not present

## 2017-04-29 ENCOUNTER — Telehealth: Payer: Self-pay | Admitting: *Deleted

## 2017-04-29 NOTE — Telephone Encounter (Signed)
Patient missed previsit appointment 04/29/17 at 79 am. Message left to reschedule PV appointment before 5 pm today in order to keep 05/13/17 colonoscopy appointment. If PV not rescheduled today by 5 pm, colonoscopy will be cancelled and both appointments will need to be rescheduled.

## 2017-05-06 DIAGNOSIS — N951 Menopausal and female climacteric states: Secondary | ICD-10-CM | POA: Diagnosis not present

## 2017-05-07 DIAGNOSIS — G4700 Insomnia, unspecified: Secondary | ICD-10-CM | POA: Diagnosis not present

## 2017-05-07 DIAGNOSIS — R7309 Other abnormal glucose: Secondary | ICD-10-CM | POA: Diagnosis not present

## 2017-05-07 DIAGNOSIS — N951 Menopausal and female climacteric states: Secondary | ICD-10-CM | POA: Diagnosis not present

## 2017-05-07 DIAGNOSIS — R232 Flushing: Secondary | ICD-10-CM | POA: Diagnosis not present

## 2017-05-12 DIAGNOSIS — H2513 Age-related nuclear cataract, bilateral: Secondary | ICD-10-CM | POA: Diagnosis not present

## 2017-05-12 DIAGNOSIS — H1711 Central corneal opacity, right eye: Secondary | ICD-10-CM | POA: Diagnosis not present

## 2017-05-12 DIAGNOSIS — H40033 Anatomical narrow angle, bilateral: Secondary | ICD-10-CM | POA: Diagnosis not present

## 2017-05-13 ENCOUNTER — Encounter: Payer: Medicare Other | Admitting: Gastroenterology

## 2017-05-26 DIAGNOSIS — H40032 Anatomical narrow angle, left eye: Secondary | ICD-10-CM | POA: Diagnosis not present

## 2017-06-02 DIAGNOSIS — K5900 Constipation, unspecified: Secondary | ICD-10-CM | POA: Diagnosis not present

## 2017-06-02 DIAGNOSIS — G4733 Obstructive sleep apnea (adult) (pediatric): Secondary | ICD-10-CM | POA: Diagnosis not present

## 2017-06-11 DIAGNOSIS — N951 Menopausal and female climacteric states: Secondary | ICD-10-CM | POA: Diagnosis not present

## 2017-06-17 DIAGNOSIS — H40032 Anatomical narrow angle, left eye: Secondary | ICD-10-CM | POA: Diagnosis not present

## 2017-06-19 DIAGNOSIS — J328 Other chronic sinusitis: Secondary | ICD-10-CM | POA: Diagnosis not present

## 2017-06-19 DIAGNOSIS — R0989 Other specified symptoms and signs involving the circulatory and respiratory systems: Secondary | ICD-10-CM | POA: Diagnosis not present

## 2017-06-19 DIAGNOSIS — J329 Chronic sinusitis, unspecified: Secondary | ICD-10-CM | POA: Diagnosis not present

## 2017-06-19 DIAGNOSIS — J309 Allergic rhinitis, unspecified: Secondary | ICD-10-CM | POA: Diagnosis not present

## 2017-06-19 DIAGNOSIS — J343 Hypertrophy of nasal turbinates: Secondary | ICD-10-CM | POA: Diagnosis not present

## 2017-06-19 DIAGNOSIS — R0982 Postnasal drip: Secondary | ICD-10-CM | POA: Diagnosis not present

## 2017-06-19 DIAGNOSIS — R1310 Dysphagia, unspecified: Secondary | ICD-10-CM | POA: Diagnosis not present

## 2017-06-19 DIAGNOSIS — J342 Deviated nasal septum: Secondary | ICD-10-CM | POA: Diagnosis not present

## 2017-06-19 DIAGNOSIS — M8508 Fibrous dysplasia (monostotic), other site: Secondary | ICD-10-CM | POA: Diagnosis not present

## 2017-06-19 DIAGNOSIS — J3489 Other specified disorders of nose and nasal sinuses: Secondary | ICD-10-CM | POA: Diagnosis not present

## 2017-06-23 DIAGNOSIS — G4700 Insomnia, unspecified: Secondary | ICD-10-CM | POA: Diagnosis not present

## 2017-06-23 DIAGNOSIS — R232 Flushing: Secondary | ICD-10-CM | POA: Diagnosis not present

## 2017-07-31 ENCOUNTER — Other Ambulatory Visit: Payer: Self-pay | Admitting: Family

## 2017-07-31 DIAGNOSIS — Z1231 Encounter for screening mammogram for malignant neoplasm of breast: Secondary | ICD-10-CM

## 2017-08-06 DIAGNOSIS — H2513 Age-related nuclear cataract, bilateral: Secondary | ICD-10-CM | POA: Diagnosis not present

## 2017-08-06 DIAGNOSIS — R232 Flushing: Secondary | ICD-10-CM | POA: Diagnosis not present

## 2017-08-06 DIAGNOSIS — G4733 Obstructive sleep apnea (adult) (pediatric): Secondary | ICD-10-CM | POA: Diagnosis not present

## 2017-08-06 DIAGNOSIS — H179 Unspecified corneal scar and opacity: Secondary | ICD-10-CM | POA: Diagnosis not present

## 2017-08-15 ENCOUNTER — Ambulatory Visit: Payer: Medicare Other

## 2017-08-15 ENCOUNTER — Ambulatory Visit
Admission: RE | Admit: 2017-08-15 | Discharge: 2017-08-15 | Disposition: A | Discharge status: Home / Self Care | Payer: Medicare Other | Source: Ambulatory Visit | Attending: Family | Admitting: Family

## 2017-08-15 DIAGNOSIS — Z1231 Encounter for screening mammogram for malignant neoplasm of breast: Secondary | ICD-10-CM

## 2017-09-02 ENCOUNTER — Ambulatory Visit: Payer: Medicare Other | Admitting: *Deleted

## 2017-09-02 IMAGING — CT CT CHEST HIGH RESOLUTION W/O CM
2 of 6 series · 14 of 36 positions shown, 17 images · non-contrast
Comparison: Chest CT 11/10/2016.

CLINICAL DATA: 50-year-old female with history of solitary
pulmonary nodule. Recent history of cough, congestion and shortness
of breath after receiving flu vaccine. The

EXAM:
CT CHEST WITHOUT CONTRAST
TECHNIQUE: Multidetector CT imaging of the chest was performed following the
standard protocol without intravenous contrast. High resolution
imaging of the lungs, as well as inspiratory and expiratory imaging,
was performed.

[Series 7: super d · axial · 0.70mm/px · z∈[-39,+224]mm · 11 of 301 slices shown, 14 images]
[im 19/301  mediastinal]
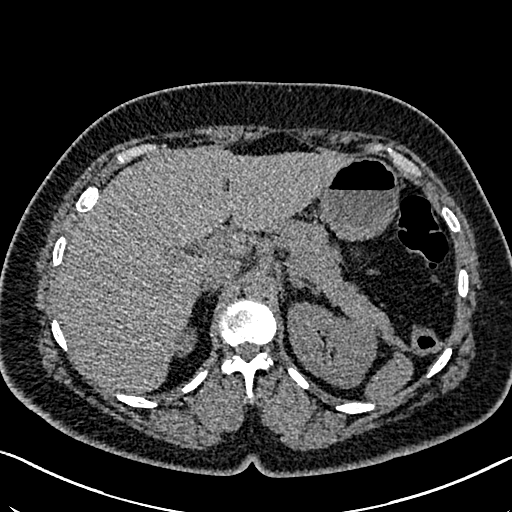
[im 19/301  lung]
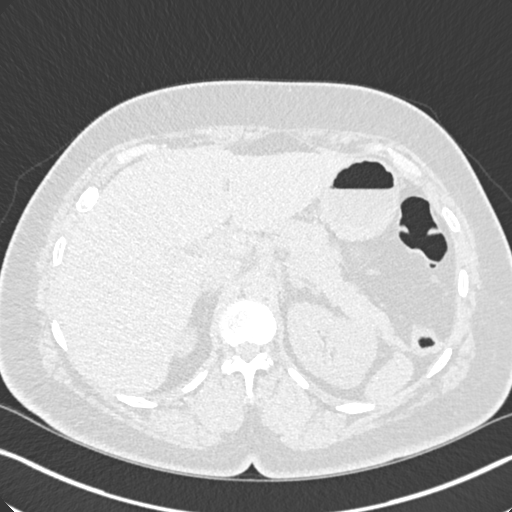
[im 57/301  lung]
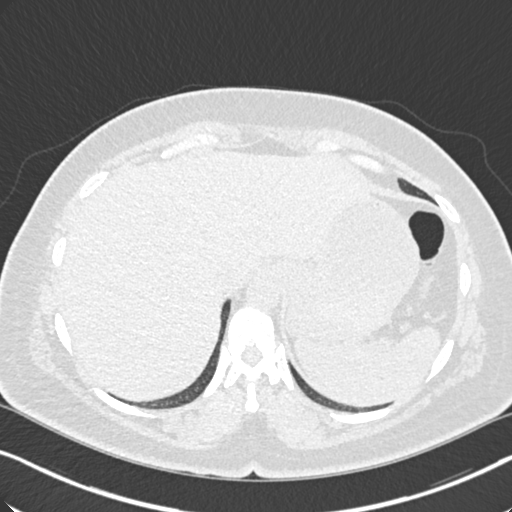
[im 76/301  lung]
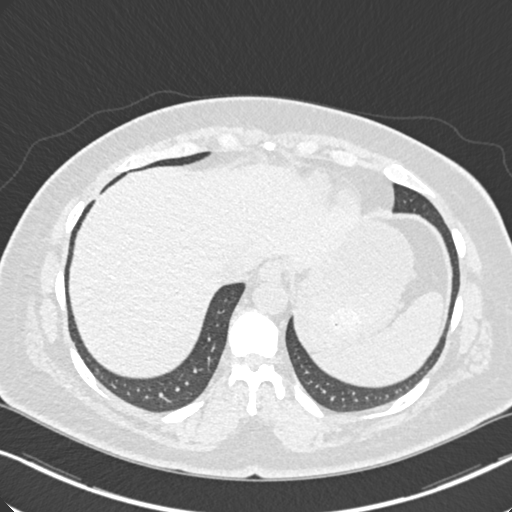
[im 94/301  lung]
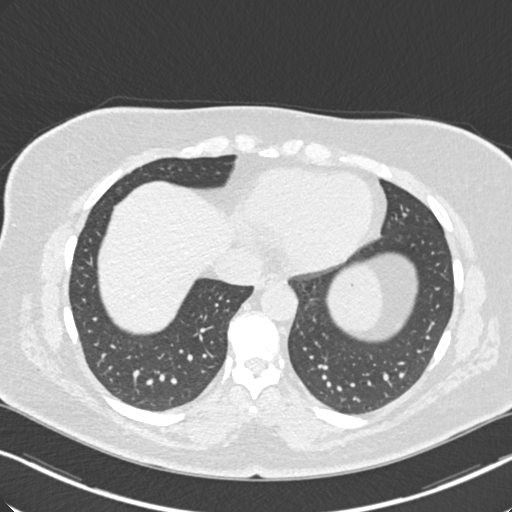
[im 132/301  mediastinal]
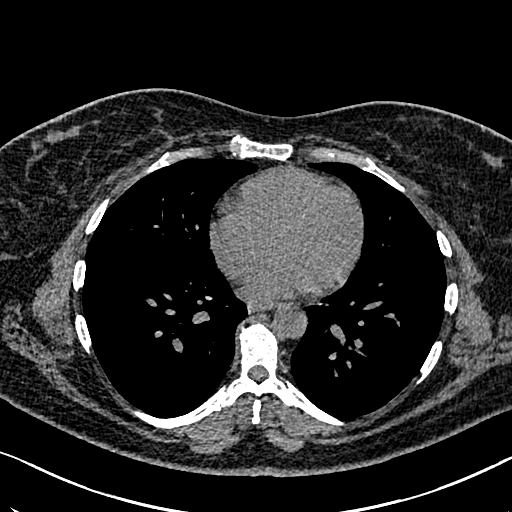
[im 132/301  lung]
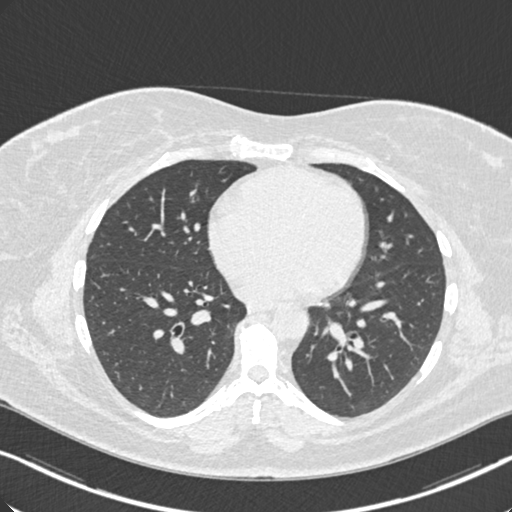
[im 151/301  lung]
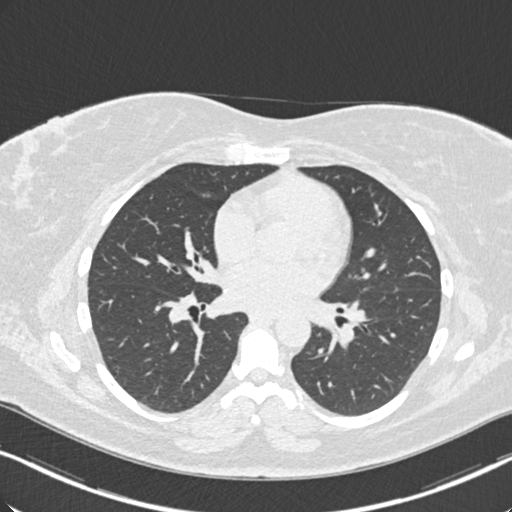
[im 169/301  lung]
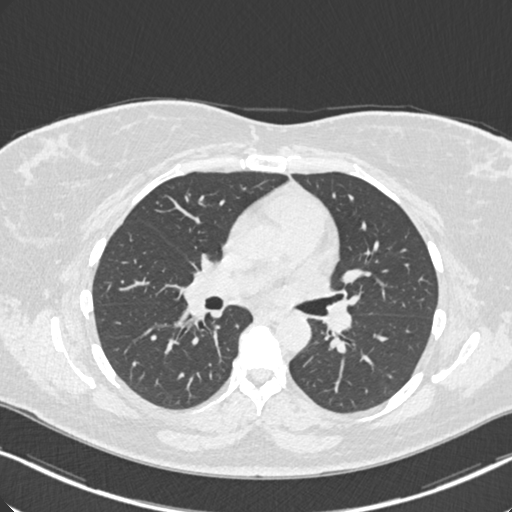
[im 207/301  lung]
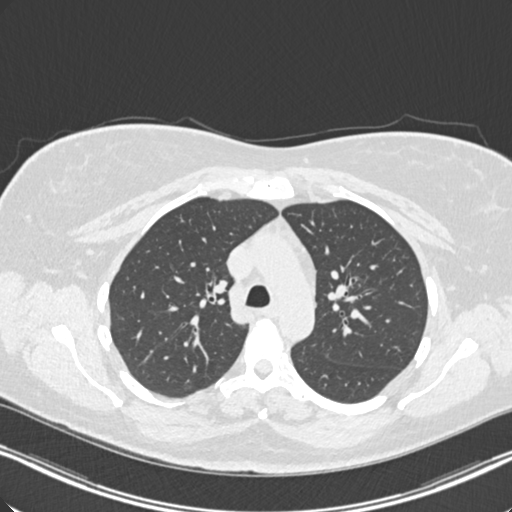
[im 226/301  mediastinal]
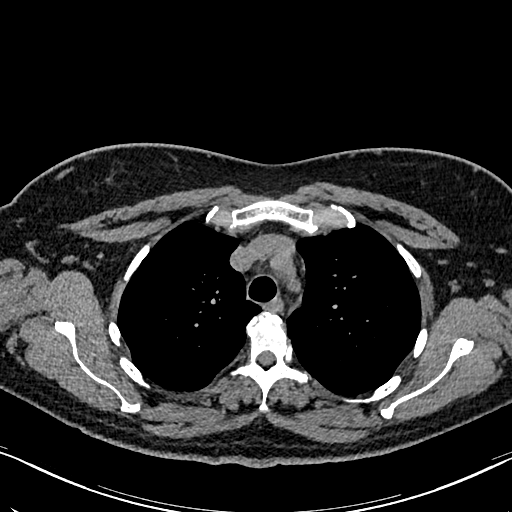
[im 226/301  lung]
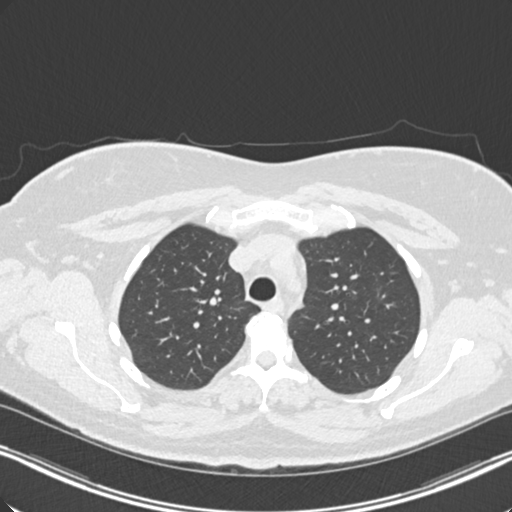
[im 244/301  lung]
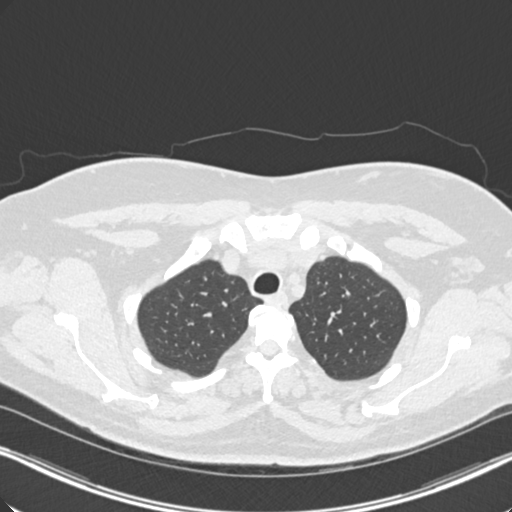
[im 282/301  lung]
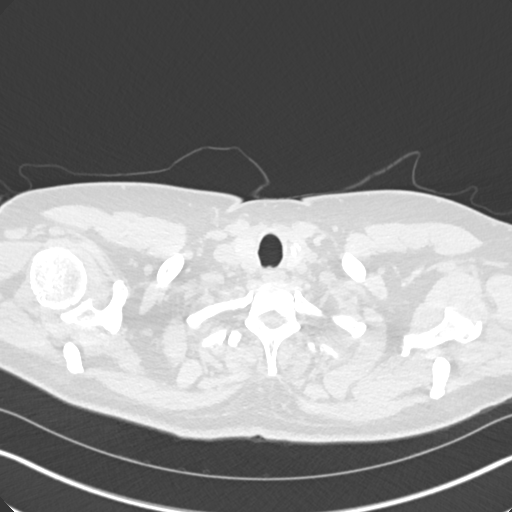

[Series 9: coronal · coronal · 0.62mm/px · 3 of 100 slices shown]
[im 20/100  lung]
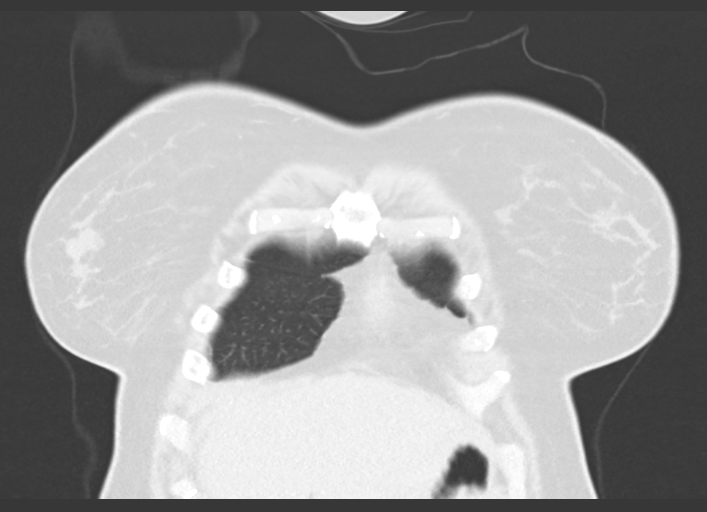
[im 40/100  lung]
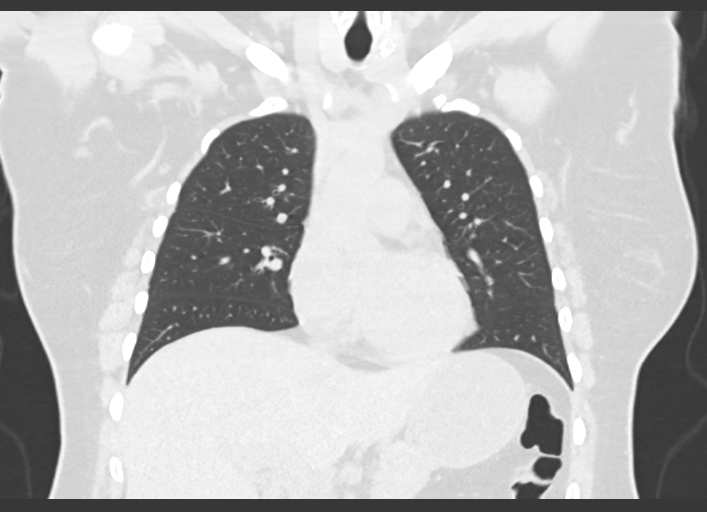
[im 60/100  lung]
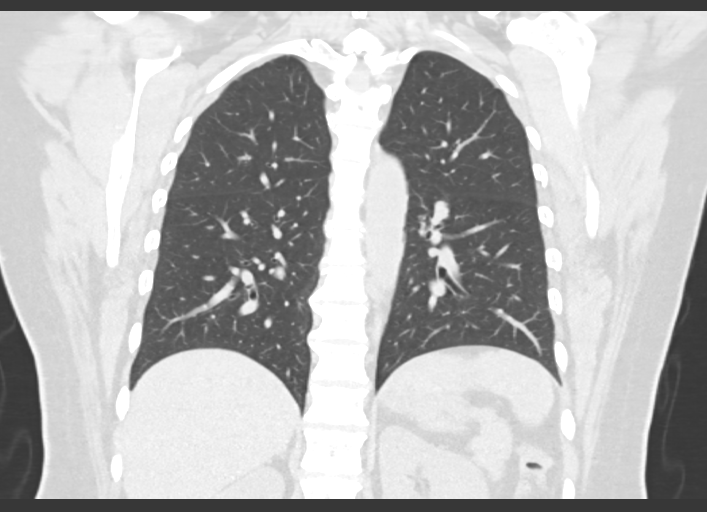

[14 of 36 positions shown; findings below may reference images not displayed]

FINDINGS: Cardiovascular: Heart size is normal. There is no significant
pericardial fluid, thickening or pericardial calcification.
Calcifications in the ductus remnant (normal). No other
atherosclerotic calcifications are identified in the thoracic aorta
or the coronary arteries.

Mediastinum/Nodes: No pathologically enlarged mediastinal or hilar
lymph nodes. Please note that accurate exclusion of hilar adenopathy
is limited on noncontrast CT scans. Esophagus is unremarkable in
appearance. 1 cm calcified nodule in the left lobe of the thyroid
gland is unchanged. No axillary lymphadenopathy.

Lungs/Pleura: Previously noted right lower lobe pulmonary nodule is
stable in size measuring 7 x 9 x 10 mm (axial image 82 of series 8
and sagittal image 54 of series 10). This is smoothly marginated
with no macrolobulation or spiculations. 2 mm right upper lobe
pulmonary nodule (image 37 of series 8) is unchanged. No other
larger more suspicious appearing pulmonary nodules or masses are
noted. There is no acute consolidative airspace disease. No pleural
effusions.

Upper Abdomen: Previously noted left adrenal lesion is unchanged
measuring 11 mm, and demonstrates low-attenuation (4 HU), compatible
with an adenoma. Unremarkable.

Musculoskeletal: There are no aggressive appearing lytic or blastic
lesions noted in the visualized portions of the skeleton.
IMPRESSION: 1. Two small pulmonary nodules in the right lung, both of which are
unchanged compared to the recent prior examination from 11/10/2016.
The largest of these nodules in the right lower lobe has a generally
benign appearance, but is suspicious based on its size (7 x 9 x 10
mm) which is unchanged in the short time interval. This time
interval is not sufficient to accurately assess interval growth and
determine risk. As previously suggested, consider one of the
following in 3 months for both low-risk and high-risk individuals:
(a) repeat chest CT, (b) follow-up PET-CT, or (c) tissue sampling.
This recommendation follows the consensus statement: Guidelines for
Management of Incidental Pulmonary Nodules Detected on CT Images:
2. Additional incidental findings, as above.

## 2017-10-02 DIAGNOSIS — R1319 Other dysphagia: Secondary | ICD-10-CM | POA: Diagnosis not present

## 2017-10-02 DIAGNOSIS — K219 Gastro-esophageal reflux disease without esophagitis: Secondary | ICD-10-CM | POA: Diagnosis not present

## 2018-01-21 ENCOUNTER — Telehealth: Payer: Self-pay | Admitting: Pulmonary Disease

## 2018-01-21 DIAGNOSIS — R911 Solitary pulmonary nodule: Secondary | ICD-10-CM

## 2018-01-21 NOTE — Telephone Encounter (Signed)
Called and spoke with pt stating to her per her last OV with RA 02/05/17, he wanted pt to have a repeated CT scan so we could follow up on the nodule from the previous scan.  Stated to pt I did not see an order placed but would go ahead and place an order for this.  Pt expressed understanding and asked me if this was too soon compared to the last scan.  Stated to pt we usually do f/u CT scans on pt's once a year to f/u.  Pt expressed understanding. Nothing further needed at this time.

## 2018-01-28 ENCOUNTER — Ambulatory Visit: Payer: Medicare Other | Admitting: *Deleted

## 2018-01-29 ENCOUNTER — Ambulatory Visit: Payer: Medicare Other | Admitting: Family

## 2018-01-29 ENCOUNTER — Encounter: Payer: Self-pay | Admitting: Family

## 2018-01-29 ENCOUNTER — Ambulatory Visit (INDEPENDENT_AMBULATORY_CARE_PROVIDER_SITE_OTHER): Payer: Medicare Other

## 2018-01-29 VITALS — BP 99/71 | HR 93 | Temp 97.7°F | Ht 65.0 in | Wt 210.0 lb

## 2018-01-29 DIAGNOSIS — S161XXA Strain of muscle, fascia and tendon at neck level, initial encounter: Secondary | ICD-10-CM | POA: Diagnosis not present

## 2018-01-29 DIAGNOSIS — R51 Headache: Secondary | ICD-10-CM | POA: Diagnosis not present

## 2018-01-29 DIAGNOSIS — M542 Cervicalgia: Secondary | ICD-10-CM | POA: Diagnosis not present

## 2018-01-29 DIAGNOSIS — Z981 Arthrodesis status: Secondary | ICD-10-CM

## 2018-01-29 DIAGNOSIS — E669 Obesity, unspecified: Secondary | ICD-10-CM | POA: Diagnosis not present

## 2018-01-29 DIAGNOSIS — R519 Headache, unspecified: Secondary | ICD-10-CM

## 2018-01-29 MED ORDER — DICLOFENAC SODIUM 75 MG PO TBEC: 75.0000 mg | DELAYED_RELEASE_TABLET | Freq: Two times a day (BID) | ORAL | 0 refills | Status: DC

## 2018-01-29 MED ORDER — CYCLOBENZAPRINE HCL 10 MG PO TABS: 10.0000 mg | ORAL_TABLET | Freq: Three times a day (TID) | ORAL | 0 refills | Status: DC | PRN
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
## 2018-01-29 NOTE — Patient Instructions (Signed)

## 2018-02-04 ENCOUNTER — Ambulatory Visit: Payer: Medicare Other | Admitting: *Deleted

## 2018-02-11 ENCOUNTER — Encounter: Payer: Self-pay | Admitting: *Deleted

## 2018-02-11 ENCOUNTER — Ambulatory Visit (INDEPENDENT_AMBULATORY_CARE_PROVIDER_SITE_OTHER): Payer: Medicare Other | Admitting: *Deleted

## 2018-02-11 VITALS — BP 110/69 | HR 73 | Ht 65.25 in | Wt 208.0 lb

## 2018-02-11 DIAGNOSIS — F419 Anxiety disorder, unspecified: Secondary | ICD-10-CM | POA: Insufficient documentation

## 2018-02-11 DIAGNOSIS — Z0000 Encounter for general adult medical examination without abnormal findings: Secondary | ICD-10-CM

## 2018-02-11 MED ORDER — VITAMIN D (ERGOCALCIFEROL) 1.25 MG (50000 UNIT) PO CAPS: 50000.0000 [IU] | ORAL_CAPSULE | ORAL | 0 refills | Status: DC

## 2018-02-11 NOTE — Patient Instructions (Signed)
  Ms. Willets , Thank you for taking time to come for your Medicare Wellness Visit. I appreciate your ongoing commitment to your health goals. Please review the following plan we discussed and let me know if I can assist you in the future.   These are the goals we discussed: Goals    . Exercise 150 min/wk Moderate Activity       This is a list of the screening recommended for you and due dates:  Health Maintenance  Topic Date Due  . Colon Cancer Screening  08/14/2015  . Flu Shot  05/28/2018  . Mammogram  08/15/2018  . Pap Smear  02/28/2020  . Tetanus Vaccine  10/22/2022  . HIV Screening  Completed

## 2018-02-11 NOTE — Progress Notes (Signed)
Subjective:   Grace Barajas is a 52 y.o. female who presents for an Initial Medicare Annual Wellness Visit. Grace Barajas recently graduated with her bachelor's degree in agriculture. She is unemployed due to disability but would like to start working again and put her new degree to use. She lives in [place] with her 71 year old daughter but she owns land in [place] and [place] and has a home in [place] that she rents out.   Review of Systems    Reports that her health is better than last year.     Cardio: Risk Factors-sedentary lifestyle and obesity   Musc: neck pain-seen by PCP 01/29/18 and given flexeril      Objective:    Today's Vitals   02/11/18 1017  BP: 110/69  Pulse: 73  Weight: 208 lb (94.3 kg)  Height: 5' 5.25" (1.657 m)   Body mass index is 34.35 kg/m.  Advanced Directives 02/11/2018 11/27/2016 11/10/2016 09/26/2016 09/04/2015 01/10/2014 01/21/2013  Does Patient Have a Medical Advance Directive? No Yes Yes No Yes Patient has advance directive, copy not in chart Patient has advance directive, copy in chart  Type of Advance Directive - [place];Living will - - [place];Living will Living will Living will  Does patient want to make changes to medical advance directive? - - - - - No change requested -  Copy of [place] in Chart? - No - copy requested - - - - -  Would patient like information on creating a medical advance directive? No - Patient declined - - - - - -  Pre-existing out of facility DNR order (yellow form or pink MOST form) - - - - - - No    Current Medications (verified) Outpatient Encounter Medications as of 02/11/2018  Medication Sig  . Biotin 5 MG TABS Take by mouth.  Marland Kitchen buPROPion (WELLBUTRIN XL) 300 MG 24 hr tablet Take 300 mg by mouth daily.  . cyclobenzaprine (FLEXERIL) 10 MG tablet Take 1 tablet (10 mg total) by mouth 3 (three) times daily as needed for muscle spasms.  .  diclofenac (VOLTAREN) 75 MG EC tablet Take 1 tablet (75 mg total) by mouth 2 (two) times daily.  . ferrous sulfate 325 (65 FE) MG tablet Take 325 mg by mouth daily with breakfast.  . FLUoxetine (PROZAC) 40 MG capsule Take 40 mg by mouth.  . Multiple Vitamins-Minerals (MULTIVITAMIN WITH MINERALS) tablet Take 1 tablet by mouth daily.  . Omega-3 Fatty Acids (FISH OIL) 1000 MG CAPS Take 1 capsule by mouth daily.   Marland Kitchen perphenazine (TRILAFON) 8 MG tablet Take 8 mg by mouth.  . ranitidine (ZANTAC) 300 MG tablet Take by mouth.  . Vitamin D, Ergocalciferol, (DRISDOL) 50000 units CAPS capsule Take 1 capsule (50,000 Units total) by mouth every 7 (seven) days.   No facility-administered encounter medications on file as of 02/11/2018.     Allergies (verified) Buprenorphine hcl and Morphine and related   History: Past Medical History:  Diagnosis Date  . Anxiety   . Depression   . High cholesterol   . Pneumonia   . Sinus disorder   . Snores   . Wears glasses    Past Surgical History:  Procedure Laterality Date  . ABDOMINAL HYSTERECTOMY    . BILATERAL SALPINGECTOMY Bilateral 01/21/2013   Procedure: BILATERAL SALPINGECTOMY;  Surgeon: Marvene Staff, MD;  Location: [facility];  Service: Gynecology;  Laterality: Bilateral;  . CARPAL TUNNEL  RELEASE Left 07/03/2007  . CARPAL TUNNEL RELEASE Right 07/31/2007  . CERVICAL FUSION     C1/C2  . DORSAL COMPARTMENT RELEASE Bilateral 01/13/2014   Procedure: BILATERAL 1ST DORSAL COMPARTMENT RELEASES;  Surgeon: Cammie Sickle., MD;  Location: [facility];  Service: Orthopedics;  Laterality: Bilateral;  bilateral  . ROBOTIC ASSISTED TOTAL HYSTERECTOMY N/A 01/21/2013   Procedure: ROBOTIC ASSISTED TOTAL HYSTERECTOMY;  Surgeon: Marvene Staff, MD;  Location: [facility];  Service: Gynecology;  Laterality: N/A;  . TUBAL LIGATION     Family History  Problem Relation Age of Onset  . Asthma Son   . Asthma Brother   . Obesity Father   . Diabetes  Father   . Heart disease Father   . Hyperlipidemia Father   . Thyroid disease Neg Hx   . Breast cancer Neg Hx    Social History   Socioeconomic History  . Marital status: Single    Spouse name: Not on file  . Number of children: 3  . Years of education: Not on file  . Highest education level: Not on file  Occupational History  . Occupation: disabled  Social Needs  . Financial resource strain: Not hard at all  . Food insecurity:    Worry: Never true    Inability: Never true  . Transportation needs:    Medical: No    Non-medical: No  Tobacco Use  . Smoking status: Never Smoker  . Smokeless tobacco: Never Used  Substance and Sexual Activity  . Alcohol use: Yes    Comment: social  . Drug use: No  . Sexual activity: Not on file  Lifestyle  . Physical activity:    Days per week: 0 days    Minutes per session: 0 min  . Stress: Not on file  Relationships  . Social connections:    Talks on phone: More than three times a week    Gets together: Once a week    Attends religious service: More than 4 times per year    Active member of club or organization: No    Attends meetings of clubs or organizations: Never    Relationship status: Never married  Other Topics Concern  . Not on file  Social History Narrative  . Not on file    Tobacco Counseling No tobacco use  Clinical Intake:    Nutritional Status: BMI > 30  Obese Diabetes: No  How often do you need to have someone help you when you read instructions, pamphlets, or other written materials from your doctor or pharmacy?: 1 - Never What is the last grade level you completed in school?: bachelor's degree  Interpreter Needed?: No  Information entered by :: Chong Sicilian, RN   Activities of Daily Living  No difficulty with hearing or vision.  No difficulty concentrating, remembering, or making decisions No difficulty with walking Able to take care of her own ADLs    Immunizations and Health  Maintenance Immunization History  Administered Date(s) Administered  . Influenza,inj,Quad PF,6+ Mos 08/29/2014, 01/02/2015, 09/12/2016  . Influenza-Unspecified 12/20/2015  . Tdap 09/24/2008   Health Maintenance Due  Topic Date Due  . COLONOSCOPY  08/14/2015    Patient Care Team: Sharion Balloon, FNP as PCP - General (Nurse Practitioner) Rigoberto Noel, MD as Consulting Physician (Pulmonary Disease) Marilynne Halsted, MD as Referring Physician (Ophthalmology) Wendie Chess, Chrystie Nose, MD (Otolaryngology) Nyoka Cowden Shella Maxim, MD as Referring Physician (Psychiatry)  No hospitalizations, ER visits, or surgeries this past year.  Assessment:   This is a routine wellness examination for Daliya.  Hearing/Vision screen No exam data present  Dietary issues and exercise activities discussed:  No current exercise habits. No limiting factors.   Diet She "grazes" during the morning and then eats a more formal meal in the evening  Goals    . Exercise 150 min/wk Moderate Activity      Depression Screen PHQ 2/9 Scores 01/29/2018 02/27/2017 12/31/2016 11/15/2016 10/15/2016 01/23/2016 02/08/2015  PHQ - 2 Score 0 0 0 0 0 0 0    Fall Risk Fall Risk  01/29/2018 02/27/2017 12/31/2016 11/15/2016 10/15/2016  Falls in the past year? No No No No No    Cognitive Function: MMSE - Mini Mental State Exam 02/11/2018  Orientation to time 5  Orientation to Place 5  Registration 3  Attention/ Calculation 5  Recall 3  Language- name 2 objects 2  Language- repeat 1  Language- follow 3 step command 3  Language- read & follow direction 1  Write a sentence 1  Copy design 0  Total score 29  Normal exam      Screening Tests Health Maintenance  Topic Date Due  . COLONOSCOPY  08/14/2015  . INFLUENZA VACCINE  05/28/2018  . MAMMOGRAM  08/15/2018  . PAP SMEAR  02/28/2020  . TETANUS/TDAP  10/22/2022  . HIV Screening  Completed       Plan:  Patient states that she will contact GI to schedule  colonoscopy Keep f/u with PCP and specialists Increase activity level. Aim for at least 150 min of mod activity a week  I have personally reviewed and noted the following in the patient's chart:   . Medical and social history . Use of alcohol, tobacco or illicit drugs  . Current medications and supplements . Functional ability and status . Nutritional status . Physical activity . Advanced directives . List of other physicians . Hospitalizations, surgeries, and ER visits in previous 12 months . Vitals . Screenings to include cognitive, depression, and falls . Referrals and appointments  In addition, I have reviewed and discussed with patient certain preventive protocols, quality metrics, and best practice recommendations. A written personalized care plan for preventive services as well as general preventive health recommendations were provided to patient.     Chong Sicilian, RN   02/12/2018

## 2018-02-18 ENCOUNTER — Encounter: Payer: Self-pay | Admitting: *Deleted

## 2018-03-04 ENCOUNTER — Ambulatory Visit (INDEPENDENT_AMBULATORY_CARE_PROVIDER_SITE_OTHER)
Admission: RE | Admit: 2018-03-04 | Discharge: 2018-03-04 | Disposition: A | Discharge status: Home / Self Care | Payer: Medicare Other | Source: Ambulatory Visit | Attending: Pulmonary Disease | Admitting: Pulmonary Disease

## 2018-03-04 DIAGNOSIS — R911 Solitary pulmonary nodule: Secondary | ICD-10-CM | POA: Diagnosis not present

## 2018-03-09 ENCOUNTER — Telehealth: Payer: Self-pay | Admitting: Pulmonary Disease

## 2018-03-09 DIAGNOSIS — R911 Solitary pulmonary nodule: Secondary | ICD-10-CM

## 2018-03-09 NOTE — Telephone Encounter (Signed)
Notes recorded by Rigoberto Noel, MD on 03/05/2018 at 10:55 AM EDT Slight growth compared to prior CT scan. Would suggest PET scan and follow-up OV with me --------------- Spoke with pt, aware of results/recs.  PET ordered.  Pt scheduled to see RA next available on 6/3.  Nothing further needed.

## 2018-03-12 ENCOUNTER — Encounter: Payer: Self-pay | Admitting: Gastroenterology

## 2018-03-12 ENCOUNTER — Encounter: Payer: Self-pay | Admitting: Family

## 2018-03-12 ENCOUNTER — Ambulatory Visit (INDEPENDENT_AMBULATORY_CARE_PROVIDER_SITE_OTHER): Payer: Medicare Other | Admitting: Family

## 2018-03-12 VITALS — BP 118/80 | HR 74 | Temp 99.6°F | Ht 65.25 in | Wt 204.0 lb

## 2018-03-12 DIAGNOSIS — Z0000 Encounter for general adult medical examination without abnormal findings: Secondary | ICD-10-CM

## 2018-03-12 DIAGNOSIS — Z1211 Encounter for screening for malignant neoplasm of colon: Secondary | ICD-10-CM

## 2018-03-12 DIAGNOSIS — R911 Solitary pulmonary nodule: Secondary | ICD-10-CM

## 2018-03-12 DIAGNOSIS — Z01419 Encounter for gynecological examination (general) (routine) without abnormal findings: Secondary | ICD-10-CM

## 2018-03-12 DIAGNOSIS — E66811 Obesity, class 1: Secondary | ICD-10-CM

## 2018-03-12 DIAGNOSIS — E559 Vitamin D deficiency, unspecified: Secondary | ICD-10-CM | POA: Diagnosis not present

## 2018-03-12 DIAGNOSIS — F411 Generalized anxiety disorder: Secondary | ICD-10-CM

## 2018-03-12 DIAGNOSIS — E669 Obesity, unspecified: Secondary | ICD-10-CM

## 2018-03-12 DIAGNOSIS — F419 Anxiety disorder, unspecified: Secondary | ICD-10-CM

## 2018-03-12 DIAGNOSIS — F251 Schizoaffective disorder, depressive type: Secondary | ICD-10-CM

## 2018-03-12 DIAGNOSIS — E785 Hyperlipidemia, unspecified: Secondary | ICD-10-CM

## 2018-03-12 DIAGNOSIS — F339 Major depressive disorder, recurrent, unspecified: Secondary | ICD-10-CM

## 2018-03-12 LAB — URINALYSIS, COMPLETE
Bilirubin, UA: NEGATIVE
GLUCOSE, UA: NEGATIVE
Ketones, UA: NEGATIVE
Leukocytes, UA: NEGATIVE
Nitrite, UA: NEGATIVE
PROTEIN UA: NEGATIVE
Specific Gravity, UA: 1.025 (ref 1.005–1.030)
Urobilinogen, Ur: 1 mg/dL (ref 0.2–1.0)
pH, UA: 6.5 (ref 5.0–7.5)

## 2018-03-12 LAB — MICROSCOPIC EXAMINATION

## 2018-03-12 NOTE — Patient Instructions (Signed)

## 2018-03-12 NOTE — Progress Notes (Signed)
Subjective:    Patient ID: Grace Barajas, female    DOB: [birth year], 52 y.o.   MRN: [id number]  Chief Complaint  Patient presents with  . Gynecologic Exam    Pt presents to the office today for CPE with pap. PT is followed by [facility] every month for schizo-affective  Disorder, GAD, and depression. PT is followed by Pulmonologist for lung nodule and CPAP. PT states she is very worried right now because one of her nodules had grown from 10 mm to 14 mm and she has a PET scan set up for tomorrow.  Hyperlipidemia  This is a chronic problem. The current episode started more than 1 year ago. The problem is uncontrolled. Recent lipid tests were reviewed and are high. Exacerbating diseases include obesity. She is currently on no antihyperlipidemic treatment. The current treatment provides no improvement of lipids. Risk factors for coronary artery disease include dyslipidemia and a sedentary lifestyle.  Gynecologic Exam  The patient's pertinent negatives include no genital lesions, genital odor or missed menses. This is a chronic problem.      Review of Systems  Genitourinary: Negative for missed menses.  Psychiatric/Behavioral: The patient is nervous/anxious.   All other systems reviewed and are negative.  Family History  Problem Relation Age of Onset  . Asthma Son   . Asthma Brother   . Obesity Father   . Diabetes Father   . Heart disease Father   . Hyperlipidemia Father   . Thyroid disease Neg Hx   . Breast cancer Neg Hx    Social History   Socioeconomic History  . Marital status: Single    Spouse name: Not on file  . Number of children: 3  . Years of education: Not on file  . Highest education level: Not on file  Occupational History  . Occupation: disabled  Social Needs  . Financial resource strain: Not hard at all  . Food insecurity:    Worry: Never true    Inability: Never true  . Transportation needs:    Medical: No    Non-medical: No  Tobacco Use    . Smoking status: Never Smoker  . Smokeless tobacco: Never Used  Substance and Sexual Activity  . Alcohol use: Yes    Comment: social  . Drug use: No  . Sexual activity: Not on file  Lifestyle  . Physical activity:    Days per week: 0 days    Minutes per session: 0 min  . Stress: Not on file  Relationships  . Social connections:    Talks on phone: More than three times a week    Gets together: Once a week    Attends religious service: More than 4 times per year    Active member of club or organization: No    Attends meetings of clubs or organizations: Never    Relationship status: Never married  Other Topics Concern  . Not on file  Social History Narrative  . Not on file        Objective:   Physical Exam  Constitutional: She is oriented to person, place, and time. She appears well-developed and well-nourished. No distress.  HENT:  Head: Normocephalic and atraumatic.  Right Ear: External ear normal.  Mouth/Throat: Oropharynx is clear and moist.  Eyes: Pupils are equal, round, and reactive to light.  Neck: Normal range of motion. Neck supple. No thyromegaly present.  Cardiovascular: Normal rate, regular rhythm, normal heart sounds and intact distal pulses.  No murmur  heard. Pulmonary/Chest: Effort normal and breath sounds normal. No respiratory distress. She has no wheezes.  Abdominal: Soft. Bowel sounds are normal. She exhibits no distension. There is no tenderness.  Genitourinary: Vagina normal.  Genitourinary Comments: Bimanual exam- no adnexal masses or tenderness, ovaries nonpalpable   Cervix not present- No discharge   Musculoskeletal: Normal range of motion. She exhibits no edema or tenderness.  Neurological: She is alert and oriented to person, place, and time. She has normal reflexes. No cranial nerve deficit.  Skin: Skin is warm and dry.  Psychiatric: She has a normal mood and affect. Her behavior is normal. Judgment and thought content normal.  Vitals  reviewed.     BP 118/80   Pulse 74   Temp 99.6 F (37.6 C) (Oral)   Ht 5' 5.25" (1.657 m)   Wt 204 lb (92.5 kg)   LMP 01/11/2013   BMI 33.69 kg/m      Assessment & Plan:  Grace Barajas comes in today with chief complaint of Gynecologic Exam   Diagnosis and orders addressed:  1. Gynecologic exam normal - Urinalysis, Complete - CMP14+EGFR - CBC with Differential/Platelet - IGP, Aptima HPV, rfx 16/18,45  2. Annual physical exam - CMP14+EGFR - CBC with Differential/Platelet - Lipid panel - TSH - IGP, Aptima HPV, rfx 16/18,45 - Ambulatory referral to Gastroenterology  3. Episode of recurrent major depressive disorder, unspecified depression episode severity ([place]) - CMP14+EGFR - CBC with Differential/Platelet  4. GAD (generalized anxiety disorder) - CMP14+EGFR - CBC with Differential/Platelet  5. Obesity (BMI 30.0-34.9) - CMP14+EGFR - CBC with Differential/Platelet  6. Hyperlipidemia, unspecified hyperlipidemia type - CMP14+EGFR - CBC with Differential/Platelet - Lipid panel  7. Anxiety - CMP14+EGFR - CBC with Differential/Platelet  8. Schizoaffective disorder, depressive type ([place]) - CMP14+EGFR - CBC with Differential/Platelet  9. Colon cancer screening - CMP14+EGFR - CBC with Differential/Platelet - Ambulatory referral to Gastroenterology  10. Solitary lung nodule  11. Vitamin D deficiency - VITAMIN D 25 Hydroxy (Vit-D Deficiency, Fractures)   Labs pending Health Maintenance reviewed Diet and exercise encouraged  Follow up plan: 6 months    Evelina Dun, FNP

## 2018-03-13 ENCOUNTER — Encounter: Payer: Medicare Other | Admitting: Family

## 2018-03-13 ENCOUNTER — Ambulatory Visit (HOSPITAL_COMMUNITY)
Admission: RE | Admit: 2018-03-13 | Discharge: 2018-03-13 | Disposition: A | Discharge status: Home / Self Care | Payer: Medicare Other | Source: Ambulatory Visit | Attending: Pulmonary Disease | Admitting: Pulmonary Disease

## 2018-03-13 DIAGNOSIS — R911 Solitary pulmonary nodule: Secondary | ICD-10-CM | POA: Diagnosis not present

## 2018-03-13 DIAGNOSIS — Z79899 Other long term (current) drug therapy: Secondary | ICD-10-CM | POA: Diagnosis not present

## 2018-03-13 DIAGNOSIS — M899 Disorder of bone, unspecified: Secondary | ICD-10-CM | POA: Diagnosis not present

## 2018-03-13 LAB — CBC WITH DIFFERENTIAL/PLATELET
BASOS ABS: 0 10*3/uL (ref 0.0–0.2)
Basos: 0 %
EOS (ABSOLUTE): 0.3 10*3/uL (ref 0.0–0.4)
EOS: 5 %
HEMOGLOBIN: 12.1 g/dL (ref 11.1–15.9)
Hematocrit: 36.8 % (ref 34.0–46.6)
IMMATURE GRANS (ABS): 0 10*3/uL (ref 0.0–0.1)
Immature Granulocytes: 0 %
LYMPHS ABS: 2.8 10*3/uL (ref 0.7–3.1)
Lymphs: 50 %
MCH: 28.5 pg (ref 26.6–33.0)
MCHC: 32.9 g/dL (ref 31.5–35.7)
MCV: 87 fL (ref 79–97)
MONOCYTES: 5 %
Monocytes Absolute: 0.3 10*3/uL (ref 0.1–0.9)
Neutrophils Absolute: 2.2 10*3/uL (ref 1.4–7.0)
Neutrophils: 40 %
Platelets: 417 10*3/uL — ABNORMAL HIGH (ref 150–379)
RBC: 4.25 x10E6/uL (ref 3.77–5.28)
RDW: 14.4 % (ref 12.3–15.4)
WBC: 5.6 10*3/uL (ref 3.4–10.8)

## 2018-03-13 LAB — LIPID PANEL
CHOLESTEROL TOTAL: 194 mg/dL (ref 100–199)
Chol/HDL Ratio: 4.7 ratio — ABNORMAL HIGH (ref 0.0–4.4)
HDL: 41 mg/dL (ref >39)
LDL CALC: 103 mg/dL — AB (ref 0–99)
Triglycerides: 250 mg/dL — ABNORMAL HIGH (ref 0–149)
VLDL CHOLESTEROL CAL: 50 mg/dL — AB (ref 5–40)

## 2018-03-13 LAB — CMP14+EGFR
ALBUMIN: 4.7 g/dL (ref 3.5–5.5)
ALK PHOS: 96 IU/L (ref 39–117)
ALT: 11 IU/L (ref 0–32)
AST: 9 IU/L (ref 0–40)
Albumin/Globulin Ratio: 1.7 (ref 1.2–2.2)
BUN / CREAT RATIO: 23 (ref 9–23)
BUN: 15 mg/dL (ref 6–24)
Bilirubin Total: 0.2 mg/dL (ref 0.0–1.2)
CALCIUM: 10.2 mg/dL (ref 8.7–10.2)
CO2: 25 mmol/L (ref 20–29)
CREATININE: 0.64 mg/dL (ref 0.57–1.00)
Chloride: 103 mmol/L (ref 96–106)
GFR calc Af Amer: 119 mL/min/{1.73_m2} (ref >59)
GFR, EST NON AFRICAN AMERICAN: 103 mL/min/{1.73_m2} (ref >59)
GLUCOSE: 108 mg/dL — AB (ref 65–99)
Globulin, Total: 2.7 g/dL (ref 1.5–4.5)
Potassium: 4.2 mmol/L (ref 3.5–5.2)
Sodium: 142 mmol/L (ref 134–144)
Total Protein: 7.4 g/dL (ref 6.0–8.5)

## 2018-03-13 LAB — GLUCOSE, CAPILLARY: Glucose-Capillary: 103 mg/dL — ABNORMAL HIGH (ref 65–99)

## 2018-03-13 LAB — VITAMIN D 25 HYDROXY (VIT D DEFICIENCY, FRACTURES): Vit D, 25-Hydroxy: 21.9 ng/mL — ABNORMAL LOW (ref 30.0–100.0)

## 2018-03-13 LAB — TSH: TSH: 1.76 u[IU]/mL (ref 0.450–4.500)

## 2018-03-13 MED ORDER — FLUDEOXYGLUCOSE F - 18 (FDG) INJECTION: 9.7000 | Freq: Once | INTRAVENOUS | Status: DC | PRN

## 2018-03-16 ENCOUNTER — Telehealth: Payer: Self-pay | Admitting: Pulmonary Disease

## 2018-03-16 NOTE — Telephone Encounter (Signed)
Was able to talk to patient and set up appointment for Thursday at 0900 with Dr. Elsworth Soho.  Nothing further needed

## 2018-03-16 NOTE — Progress Notes (Signed)
Was able to talk to patient and schedule appointment with Dr. Elsworth Soho for this Thursday at 0900 as requested

## 2018-03-16 NOTE — Telephone Encounter (Signed)
ATC pt, no answer. Left message for pt to call back.   Result Notes for NM PET Image Initial (PI) Skull Base To Thigh   Notes recorded by Rigoberto Noel, MD on 03/16/2018 at 1:43 PM EDT Needs sooner appt to discuss results Please reschedule office visit on Thursday

## 2018-03-17 ENCOUNTER — Other Ambulatory Visit: Payer: Self-pay | Admitting: Family

## 2018-03-17 ENCOUNTER — Encounter: Payer: Self-pay | Admitting: *Deleted

## 2018-03-17 LAB — IGP, APTIMA HPV, RFX 16/18,45
HPV APTIMA: NEGATIVE
PAP Smear Comment: 0

## 2018-03-17 MED ORDER — VITAMIN D (ERGOCALCIFEROL) 1.25 MG (50000 UNIT) PO CAPS: 50000.0000 [IU] | ORAL_CAPSULE | ORAL | 0 refills | Status: DC

## 2018-03-19 ENCOUNTER — Encounter: Payer: Self-pay | Admitting: Pulmonary Disease

## 2018-03-19 ENCOUNTER — Ambulatory Visit: Payer: Medicare Other | Admitting: Pulmonary Disease

## 2018-03-19 VITALS — BP 130/82 | HR 77 | Ht 66.5 in | Wt 202.0 lb

## 2018-03-19 DIAGNOSIS — R918 Other nonspecific abnormal finding of lung field: Secondary | ICD-10-CM

## 2018-03-19 DIAGNOSIS — R911 Solitary pulmonary nodule: Secondary | ICD-10-CM | POA: Diagnosis not present

## 2018-03-19 NOTE — Progress Notes (Signed)
   Subjective:    Patient ID: Grace Barajas, female    DOB: 04/28/1966, 52 y.o.   MRN: [id number]  HPI  52 year old never smoker for follow-up of moderate OSA and right lower lobe nodule  Noted incidentally on CT scan when she had community-acquired pneumonia in 2018 CT chest 02/2017 showed this nodule to be 1 mm larger at 11 x 9 mm  We reviewed CT and PET scan from 02/2018 today CT chest 02/2018 shows continued growth of right lower lobe nodule now at 14 mm. PET scan did not show any hypermetabolism, there is also symmetrical activity in both tonsils and a groundglass lesion in the sphenoid bone which appears stable from 01/2017  She continues to follow with ENT for recurrent sinus congestion.  She has tubes placed in the years.  She was thought to have silent reflux and has been started on PPI  Due to recurrent URI she has stopped using her CPAP machine for almost a year  Significant tests/ events reviewed  10/2016-CT angiogram showed  a right lower lobe 10 mm nodule. There were 2 thyroid nodules 10 mm calcified left lobe and 8 mm noncalcified left lobe and possible adrenal nodule was noted measuring 1.1 cm in the left.  Review of Systems Patient denies significant dyspnea,cough, hemoptysis,  chest pain, palpitations, pedal edema, orthopnea, paroxysmal nocturnal dyspnea, lightheadedness, nausea, vomiting, abdominal or  leg pains      Objective:   Physical Exam  Gen. Pleasant, well-nourished, in no distress ENT - no thrush, no post nasal drip, enlarged tonsils Neck: No JVD, no thyromegaly, no carotid bruits Lungs: no use of accessory muscles, no dullness to percussion, clear without rales or rhonchi  Cardiovascular: Rhythm regular, heart sounds  normal, no murmurs or gallops, no peripheral edema Musculoskeletal: No deformities, no cyanosis or clubbing        Assessment & Plan:

## 2018-03-19 NOTE — Assessment & Plan Note (Signed)
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 Since this continues to grow, will need continued follow-up but no hypermetabolic on PET is very reassuring Does seem to have a feeding vessel on CT  CT chest with contrast in 6 months.

## 2018-03-19 NOTE — Patient Instructions (Signed)
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  CT chest with contrast in 6 months.  Get back on your CPAP machine

## 2018-03-19 NOTE — Assessment & Plan Note (Signed)
I asked her to get back on her CPAP machine. Reassured her that this is not the cause of her recurrent URI type symptoms

## 2018-03-30 ENCOUNTER — Ambulatory Visit: Payer: Medicare Other | Admitting: Pulmonary Disease

## 2018-03-30 DIAGNOSIS — H6691 Otitis media, unspecified, right ear: Secondary | ICD-10-CM | POA: Diagnosis not present

## 2018-03-30 DIAGNOSIS — R070 Pain in throat: Secondary | ICD-10-CM | POA: Diagnosis not present

## 2018-04-01 DIAGNOSIS — J309 Allergic rhinitis, unspecified: Secondary | ICD-10-CM | POA: Diagnosis not present

## 2018-04-01 DIAGNOSIS — R0982 Postnasal drip: Secondary | ICD-10-CM | POA: Diagnosis not present

## 2018-04-01 DIAGNOSIS — J301 Allergic rhinitis due to pollen: Secondary | ICD-10-CM | POA: Diagnosis not present

## 2018-04-01 DIAGNOSIS — J329 Chronic sinusitis, unspecified: Secondary | ICD-10-CM | POA: Diagnosis not present

## 2018-04-01 DIAGNOSIS — J328 Other chronic sinusitis: Secondary | ICD-10-CM | POA: Diagnosis not present

## 2018-04-01 DIAGNOSIS — J342 Deviated nasal septum: Secondary | ICD-10-CM | POA: Diagnosis not present

## 2018-04-01 DIAGNOSIS — M8500 Fibrous dysplasia (monostotic), unspecified site: Secondary | ICD-10-CM | POA: Diagnosis not present

## 2018-04-01 DIAGNOSIS — J343 Hypertrophy of nasal turbinates: Secondary | ICD-10-CM | POA: Diagnosis not present

## 2018-04-21 ENCOUNTER — Ambulatory Visit: Payer: Medicare Other | Admitting: Gastroenterology

## 2018-04-21 ENCOUNTER — Encounter: Payer: Self-pay | Admitting: Gastroenterology

## 2018-04-21 VITALS — BP 90/64 | HR 84 | Ht 66.5 in | Wt 199.8 lb

## 2018-04-21 DIAGNOSIS — Z1211 Encounter for screening for malignant neoplasm of colon: Secondary | ICD-10-CM | POA: Diagnosis not present

## 2018-04-21 DIAGNOSIS — R1310 Dysphagia, unspecified: Secondary | ICD-10-CM

## 2018-04-21 DIAGNOSIS — R12 Heartburn: Secondary | ICD-10-CM | POA: Diagnosis not present

## 2018-04-21 DIAGNOSIS — K219 Gastro-esophageal reflux disease without esophagitis: Secondary | ICD-10-CM | POA: Diagnosis not present

## 2018-04-21 MED ORDER — NA SULFATE-K SULFATE-MG SULF 17.5-3.13-1.6 GM/177ML PO SOLN: 1.0000 | Freq: Once | ORAL | 0 refills | Status: AC

## 2018-04-21 NOTE — Patient Instructions (Addendum)
You have been scheduled for an endoscopy and colonoscopy. Please follow the written instructions given to you at your visit today. Please pick up your prep supplies at the pharmacy within the next 1-3 days. If you use inhalers (even only as needed), please bring them with you on the day of your procedure. Your physician has requested that you go to www.startemmi.com and enter the access code given to you at your visit today. This web site gives a general overview about your procedure. However, you should still follow specific instructions given to you by our office regarding your preparation for the procedure.   If you are age 53 or older, your body mass index should be between 23-30. Your Body mass index is 31.77 kg/m. If this is out of the aforementioned range listed, please consider follow up with your Primary Care Provider.  If you are age 65 or younger, your body mass index should be between 19-25. Your Body mass index is 31.77 kg/m. If this is out of the aformentioned range listed, please consider follow up with your Primary Care Provider.

## 2018-04-21 NOTE — Progress Notes (Signed)
Grace Barajas    [id number]    17-Mar-1966  Primary Care Physician:Hawks, Theador Hawthorne, FNP  Referring Physician: Sharion Balloon, [address] [place], [place] [address]  Chief complaint: Heartburn, sore throat  HPI: 52 year old female here with complaints of sore throat and burning sensation.  Sometimes she has discomfort with swallowing.  She recently saw ENT at [facility] and was informed that she has bad gastroesophageal reflux.  She was started on omeprazole, has been taking it daily for past 3 weeks with some improvement of symptoms but continues to have burning sensation, sore throat and also globus sensation in the back of throat.  She sometimes feels that the water and food take longer to go down she does not feel like she gets a full swallow.  No history of food impactions. Denies any nausea, vomiting, abdominal pain, melena or bright red blood per rectum No loss of appetite or weight loss. Never had EGD or colonoscopy. No family history of GI malignancy    Outpatient Encounter Medications as of 04/21/2018  Medication Sig  . Biotin 5 MG TABS Take by mouth.  Marland Kitchen buPROPion (WELLBUTRIN XL) 300 MG 24 hr tablet Take 300 mg by mouth daily.  . ferrous sulfate 325 (65 FE) MG tablet Take 325 mg by mouth daily with breakfast.  . FLUoxetine (PROZAC) 40 MG capsule Take 40 mg by mouth.  . Multiple Vitamins-Minerals (MULTIVITAMIN WITH MINERALS) tablet Take 1 tablet by mouth daily.  . Omega-3 Fatty Acids (FISH OIL) 1000 MG CAPS Take 1 capsule by mouth daily.   Marland Kitchen omeprazole (PRILOSEC) 40 MG capsule Take 40 mg by mouth daily.  . ranitidine (ZANTAC) 300 MG tablet Take by mouth.  . Vitamin D, Ergocalciferol, (DRISDOL) 50000 units CAPS capsule Take 1 capsule (50,000 Units total) by mouth every 7 (seven) days.   No facility-administered encounter medications on file as of 04/21/2018.     Allergies as of 04/21/2018 - Review Complete 04/21/2018  Allergen Reaction  Noted  . Buprenorphine hcl  01/16/2015  . Morphine and related  01/05/2013    Past Medical History:  Diagnosis Date  . Anxiety   . Depression   . High cholesterol   . Pneumonia   . Pneumonia   . Sinus disorder   . Sleep apnea with use of continuous positive airway pressure (CPAP)   . Snores   . Wears glasses     Past Surgical History:  Procedure Laterality Date  . BILATERAL SALPINGECTOMY Bilateral 01/21/2013   Procedure: BILATERAL SALPINGECTOMY;  Surgeon: Marvene Staff, MD;  Location: [facility];  Service: Gynecology;  Laterality: Bilateral;  . CARPAL TUNNEL RELEASE Left 07/03/2007  . CARPAL TUNNEL RELEASE Right 07/31/2007  . CERVICAL FUSION     C1/C2  . DORSAL COMPARTMENT RELEASE Bilateral 01/13/2014   Procedure: BILATERAL 1ST DORSAL COMPARTMENT RELEASES;  Surgeon: Cammie Sickle., MD;  Location: [facility];  Service: Orthopedics;  Laterality: Bilateral;  bilateral  . ROBOTIC ASSISTED TOTAL HYSTERECTOMY N/A 01/21/2013   Procedure: ROBOTIC ASSISTED TOTAL HYSTERECTOMY;  Surgeon: Marvene Staff, MD;  Location: [facility];  Service: Gynecology;  Laterality: N/A;  . TUBAL LIGATION      Family History  Problem Relation Age of Onset  . Asthma Son   . Asthma Brother   . Obesity Father   . Diabetes Father   . Heart disease Father   . Hyperlipidemia Father   . Diabetes  Maternal Grandmother   . Diabetes Maternal Aunt   . Diabetes Maternal Uncle   . Colon polyps Maternal Uncle   . Thyroid disease Neg Hx   . Breast cancer Neg Hx     Social History   Socioeconomic History  . Marital status: Single    Spouse name: Not on file  . Number of children: 3  . Years of education: Not on file  . Highest education level: Not on file  Occupational History  . Occupation: disabled  . Occupation: Ship broker  Social Needs  . Financial resource strain: Not hard at all  . Food insecurity:    Worry: Never true    Inability: Never true  . Transportation needs:     Medical: No    Non-medical: No  Tobacco Use  . Smoking status: Never Smoker  . Smokeless tobacco: Never Used  Substance and Sexual Activity  . Alcohol use: Yes    Comment: social  . Drug use: No  . Sexual activity: Not on file  Lifestyle  . Physical activity:    Days per week: 0 days    Minutes per session: 0 min  . Stress: Not on file  Relationships  . Social connections:    Talks on phone: More than three times a week    Gets together: Once a week    Attends religious service: More than 4 times per year    Active member of club or organization: No    Attends meetings of clubs or organizations: Never    Relationship status: Never married  . Intimate partner violence:    Fear of current or ex partner: No    Emotionally abused: No    Physically abused: No    Forced sexual activity: No  Other Topics Concern  . Not on file  Social History Narrative  . Not on file      Review of systems: Review of Systems  Constitutional: Negative for fever and chills.  Positive for fatigue HENT: Positive for allergy and sinus trouble Eyes: Negative for blurred vision.  Respiratory: Negative for  shortness of breath and wheezing.  Positive for cough Cardiovascular: Negative for chest pain and palpitations.  Gastrointestinal: as per HPI Genitourinary: Negative for dysuria, urgency, frequency and hematuria.  Musculoskeletal: Negative for myalgias, back pain and joint pain.  Skin: Negative for itching and rash.  Neurological: Negative for dizziness, tremors, focal weakness, seizures and loss of consciousness.  Endo/Heme/Allergies: Positive for seasonal allergies.  Psychiatric/Behavioral: Negative for  suicidal ideas and hallucinations.  Positive for anxiety and depression All other systems reviewed and are negative.   Physical Exam: Vitals:   04/21/18 1335  BP: 90/64  Pulse: 84   Body mass index is 31.77 kg/m. Gen:      No acute distress HEENT:  EOMI, sclera anicteric Neck:      No masses; no thyromegaly Lungs:    Clear to auscultation bilaterally; normal respiratory effort CV:         Regular rate and rhythm; no murmurs Abd:      + bowel sounds; soft, non-tender; no palpable masses, no distension Ext:    No edema; adequate peripheral perfusion Skin:      Warm and dry; no rash Neuro: alert and oriented x 3 Psych: normal mood and affect  Data Reviewed:  Reviewed labs, radiology imaging, old records and pertinent past GI work up   Assessment and Plan/Recommendations:  52 year old female with complaints of persistent heartburn despite PPI therapy, globus  sensation, odynophagia and dysphagia. Scheduled for EGD for evaluation Continue PPI daily Discussed antireflux measures and lifestyle modifications  Schedule for colonoscopy for colorectal cancer screening along with EGD The risks and benefits as well as alternatives of endoscopic procedure(s) have been discussed and reviewed. All questions answered. The patient agrees to proceed.    Damaris Hippo , MD [phone]    CC: Sharion Balloon, FNP

## 2018-04-26 ENCOUNTER — Encounter: Payer: Self-pay | Admitting: Gastroenterology

## 2018-06-11 ENCOUNTER — Encounter: Payer: Self-pay | Admitting: Gastroenterology

## 2018-06-16 DIAGNOSIS — H40033 Anatomical narrow angle, bilateral: Secondary | ICD-10-CM | POA: Diagnosis not present

## 2018-06-16 DIAGNOSIS — H0100B Unspecified blepharitis left eye, upper and lower eyelids: Secondary | ICD-10-CM | POA: Diagnosis not present

## 2018-06-16 DIAGNOSIS — H0100A Unspecified blepharitis right eye, upper and lower eyelids: Secondary | ICD-10-CM | POA: Diagnosis not present

## 2018-06-16 DIAGNOSIS — H40013 Open angle with borderline findings, low risk, bilateral: Secondary | ICD-10-CM | POA: Diagnosis not present

## 2018-06-16 DIAGNOSIS — H25013 Cortical age-related cataract, bilateral: Secondary | ICD-10-CM | POA: Diagnosis not present

## 2018-06-23 DIAGNOSIS — R232 Flushing: Secondary | ICD-10-CM | POA: Diagnosis not present

## 2018-06-23 DIAGNOSIS — G4700 Insomnia, unspecified: Secondary | ICD-10-CM | POA: Diagnosis not present

## 2018-06-24 DIAGNOSIS — G4700 Insomnia, unspecified: Secondary | ICD-10-CM | POA: Diagnosis not present

## 2018-06-24 DIAGNOSIS — R5383 Other fatigue: Secondary | ICD-10-CM | POA: Diagnosis not present

## 2018-06-24 DIAGNOSIS — R232 Flushing: Secondary | ICD-10-CM | POA: Diagnosis not present

## 2018-06-24 DIAGNOSIS — N951 Menopausal and female climacteric states: Secondary | ICD-10-CM | POA: Diagnosis not present

## 2018-06-25 ENCOUNTER — Encounter: Payer: Self-pay | Admitting: Gastroenterology

## 2018-06-25 ENCOUNTER — Ambulatory Visit (AMBULATORY_SURGERY_CENTER): Payer: Medicare Other | Admitting: Gastroenterology

## 2018-06-25 VITALS — BP 116/65 | HR 62 | Temp 98.7°F | Resp 18

## 2018-06-25 DIAGNOSIS — D125 Benign neoplasm of sigmoid colon: Secondary | ICD-10-CM

## 2018-06-25 DIAGNOSIS — D123 Benign neoplasm of transverse colon: Secondary | ICD-10-CM

## 2018-06-25 DIAGNOSIS — D122 Benign neoplasm of ascending colon: Secondary | ICD-10-CM

## 2018-06-25 DIAGNOSIS — D120 Benign neoplasm of cecum: Secondary | ICD-10-CM

## 2018-06-25 DIAGNOSIS — Z1211 Encounter for screening for malignant neoplasm of colon: Secondary | ICD-10-CM

## 2018-06-25 DIAGNOSIS — R1310 Dysphagia, unspecified: Secondary | ICD-10-CM | POA: Diagnosis not present

## 2018-06-25 DIAGNOSIS — K2970 Gastritis, unspecified, without bleeding: Secondary | ICD-10-CM

## 2018-06-25 DIAGNOSIS — K635 Polyp of colon: Secondary | ICD-10-CM

## 2018-06-25 MED ORDER — SODIUM CHLORIDE 0.9 % IV SOLN: 500.0000 mL | Freq: Once | INTRAVENOUS | Status: DC

## 2018-06-25 NOTE — Op Note (Signed)
[facility] Patient Name: Grace Barajas Procedure Date: 06/25/2018 7:43 AM MRN: [id number] Endoscopist: Mauri Pole , MD Age: 52 Referring MD:  Date of Birth: 02/04/1966 Gender: Female Account #: [id number] Procedure:                Colonoscopy Indications:              Screening for colorectal malignant neoplasm Medicines:                Monitored Anesthesia Care Procedure:                Pre-Anesthesia Assessment:                           - Prior to the procedure, a History and Physical                            was performed, and patient medications and                            allergies were reviewed. The patient's tolerance of                            previous anesthesia was also reviewed. The risks                            and benefits of the procedure and the sedation                            options and risks were discussed with the patient.                            All questions were answered, and informed consent                            was obtained. Prior Anticoagulants: The patient has                            taken no previous anticoagulant or antiplatelet                            agents. ASA Grade Assessment: III - A patient with                            severe systemic disease. After reviewing the risks                            and benefits, the patient was deemed in                            satisfactory condition to undergo the procedure.                           After obtaining informed consent, the colonoscope  was passed under direct vision. Throughout the                            procedure, the patient's blood pressure, pulse, and                            oxygen saturations were monitored continuously. The                            Colonoscope was introduced through the anus and                            advanced to the the cecum, identified by                            appendiceal  orifice and ileocecal valve. The                            colonoscopy was performed without difficulty. The                            patient tolerated the procedure well. The quality                            of the bowel preparation was fair. The ileocecal                            valve, appendiceal orifice, and rectum were                            photographed. Scope In: 8:20:58 AM Scope Out: 8:40:59 AM Scope Withdrawal Time: 0 hours 15 minutes 6 seconds  Total Procedure Duration: 0 hours 20 minutes 1 second  Findings:                 The perianal and digital rectal examinations were                            normal.                           A 2 mm polyp was found in the cecum. The polyp was                            sessile. The polyp was removed with a cold biopsy                            forceps. Resection and retrieval were complete.                           Four sessile polyps were found in the transverse                            colon and ascending colon. The polyps were 4 to 7  mm in size. These polyps were removed with a cold                            snare. Resection and retrieval were complete.                           A 11 mm polyp was found in the sigmoid colon. The                            polyp was pedunculated. The polyp was removed with                            a hot snare. Resection and retrieval were complete.                           Multiple small and large-mouthed diverticula were                            found in the sigmoid colon.                           Non-bleeding internal hemorrhoids were found during                            retroflexion. The hemorrhoids were small. Complications:            No immediate complications. Estimated Blood Loss:     Estimated blood loss was minimal. Impression:               - Preparation of the colon was fair.                           - One 2 mm polyp in the cecum, removed  with a cold                            biopsy forceps. Resected and retrieved.                           - Four 4 to 7 mm polyps in the transverse colon and                            in the ascending colon, removed with a cold snare.                            Resected and retrieved.                           - One 11 mm polyp in the sigmoid colon, removed                            with a hot snare. Resected and retrieved.                           - Diverticulosis  in the sigmoid colon.                           - Non-bleeding internal hemorrhoids. Recommendation:           - Patient has a contact number available for                            emergencies. The signs and symptoms of potential                            delayed complications were discussed with the                            patient. Return to normal activities tomorrow.                            Written discharge instructions were provided to the                            patient.                           - Resume previous diet.                           - Continue present medications.                           - Await pathology results.                           - Repeat colonoscopy in 1 year because the bowel                            preparation was inadequate to visualize polyps <59mm                            in size. Mauri Pole, MD 06/25/2018 8:57:39 AM This report has been signed electronically.

## 2018-06-25 NOTE — Patient Instructions (Signed)
Discharge instructions given. Handouts on Gastritis,polyps,diverticulosis and hemorrhoids. Resume previous medications. YOU HAD AN ENDOSCOPIC PROCEDURE TODAY AT [facility]:   Refer to the procedure report that was given to you for any specific questions about what was found during the examination.  If the procedure report does not answer your questions, please call your gastroenterologist to clarify.  If you requested that your care partner not be given the details of your procedure findings, then the procedure report has been included in a sealed envelope for you to review at your convenience later.  YOU SHOULD EXPECT: Some feelings of bloating in the abdomen. Passage of more gas than usual.  Walking can help get rid of the air that was put into your GI tract during the procedure and reduce the bloating. If you had a lower endoscopy (such as a colonoscopy or flexible sigmoidoscopy) you may notice spotting of blood in your stool or on the toilet paper. If you underwent a bowel prep for your procedure, you may not have a normal bowel movement for a few days.  Please Note:  You might notice some irritation and congestion in your nose or some drainage.  This is from the oxygen used during your procedure.  There is no need for concern and it should clear up in a day or so.  SYMPTOMS TO REPORT IMMEDIATELY:   Following lower endoscopy (colonoscopy or flexible sigmoidoscopy):  Excessive amounts of blood in the stool  Significant tenderness or worsening of abdominal pains  Swelling of the abdomen that is new, acute  Fever of 100F or higher   Following upper endoscopy (EGD)  Vomiting of blood or coffee ground material  New chest pain or pain under the shoulder blades  Painful or persistently difficult swallowing  New shortness of breath  Fever of 100F or higher  Black, tarry-looking stools  For urgent or emergent issues, a gastroenterologist can be reached at any hour by calling  [phone].   DIET:  We do recommend a small meal at first, but then you may proceed to your regular diet.  Drink plenty of fluids but you should avoid alcoholic beverages for 24 hours.  ACTIVITY:  You should plan to take it easy for the rest of today and you should NOT DRIVE or use heavy machinery until tomorrow (because of the sedation medicines used during the test).    FOLLOW UP: Our staff will call the number listed on your records the next business day following your procedure to check on you and address any questions or concerns that you may have regarding the information given to you following your procedure. If we do not reach you, we will leave a message.  However, if you are feeling well and you are not experiencing any problems, there is no need to return our call.  We will assume that you have returned to your regular daily activities without incident.  If any biopsies were taken you will be contacted by phone or by letter within the next 1-3 weeks.  Please call us at [phone] if you have not heard about the biopsies in 3 weeks.    SIGNATURES/CONFIDENTIALITY: You and/or your care partner have signed paperwork which will be entered into your electronic medical record.  These signatures attest to the fact that that the information above on your After Visit Summary has been reviewed and is understood.  Full responsibility of the confidentiality of this discharge information lies with you and/or your care-partner.

## 2018-06-25 NOTE — Progress Notes (Signed)
Called to room to assist during endoscopic procedure.  Patient ID and intended procedure confirmed with present staff. Received instructions for my participation in the procedure from the performing physician.  

## 2018-06-25 NOTE — Progress Notes (Signed)
Report to PACU, RN, vss, BBS= Clear.  

## 2018-06-25 NOTE — Op Note (Signed)
[facility] Patient Name: Grace Barajas Procedure Date: 06/25/2018 7:43 AM MRN: [id number] Endoscopist: Mauri Pole , MD Age: 52 Referring MD:  Date of Birth: 1966-06-14 Gender: Female Account #: [id number] Procedure:                Upper GI endoscopy Indications:              Dysphagia Medicines:                Monitored Anesthesia Care Procedure:                Pre-Anesthesia Assessment:                           - Prior to the procedure, a History and Physical                            was performed, and patient medications and                            allergies were reviewed. The patient's tolerance of                            previous anesthesia was also reviewed. The risks                            and benefits of the procedure and the sedation                            options and risks were discussed with the patient.                            All questions were answered, and informed consent                            was obtained. Prior Anticoagulants: The patient has                            taken no previous anticoagulant or antiplatelet                            agents. ASA Grade Assessment: III - A patient with                            severe systemic disease. After reviewing the risks                            and benefits, the patient was deemed in                            satisfactory condition to undergo the procedure.                           After obtaining informed consent, the endoscope was  passed under direct vision. Throughout the                            procedure, the patient's blood pressure, pulse, and                            oxygen saturations were monitored continuously. The                            Endoscope was introduced through the mouth, and                            advanced to the second part of duodenum. The upper                            GI endoscopy was accomplished  without difficulty.                            The patient tolerated the procedure well. Scope In: Scope Out: Findings:                 The Z-line was regular and was found 38 cm from the                            incisors.                           No endoscopic abnormality was evident in the                            esophagus to explain the patient's complaint of                            dysphagia.                           Patchy mild inflammation characterized by                            congestion (edema), erythema and friability was                            found in the entire examined stomach. Biopsies were                            taken with a cold forceps for Helicobacter pylori                            testing.                           The examined duodenum was normal. Complications:            No immediate complications. Estimated Blood Loss:     Estimated blood loss was minimal. Impression:               -  Z-line regular, 38 cm from the incisors.                           - No endoscopic esophageal abnormality to explain                            patient's dysphagia.                           - Gastritis. Biopsied.                           - Normal examined duodenum. Recommendation:           - Patient has a contact number available for                            emergencies. The signs and symptoms of potential                            delayed complications were discussed with the                            patient. Return to normal activities tomorrow.                            Written discharge instructions were provided to the                            patient.                           - Resume previous diet.                           - Continue present medications.                           - Await pathology results. Mauri Pole, MD 06/25/2018 8:52:00 AM This report has been signed electronically.

## 2018-06-26 LAB — HELICOBACTER PYLORI SCREEN-BIOPSY: UREASE: POSITIVE — AB

## 2018-06-30 ENCOUNTER — Telehealth: Payer: Self-pay

## 2018-06-30 ENCOUNTER — Other Ambulatory Visit: Payer: Self-pay

## 2018-06-30 DIAGNOSIS — D123 Benign neoplasm of transverse colon: Secondary | ICD-10-CM | POA: Diagnosis not present

## 2018-06-30 DIAGNOSIS — A048 Other specified bacterial intestinal infections: Secondary | ICD-10-CM

## 2018-06-30 DIAGNOSIS — D125 Benign neoplasm of sigmoid colon: Secondary | ICD-10-CM | POA: Diagnosis not present

## 2018-06-30 DIAGNOSIS — D122 Benign neoplasm of ascending colon: Secondary | ICD-10-CM | POA: Diagnosis not present

## 2018-06-30 DIAGNOSIS — D120 Benign neoplasm of cecum: Secondary | ICD-10-CM | POA: Diagnosis not present

## 2018-06-30 MED ORDER — BIS SUBCIT-METRONID-TETRACYC 140-125-125 MG PO CAPS: 3.0000 | ORAL_CAPSULE | Freq: Three times a day (TID) | ORAL | 0 refills | Status: DC

## 2018-06-30 MED ORDER — OMEPRAZOLE 40 MG PO CPDR: DELAYED_RELEASE_CAPSULE | ORAL | 0 refills | Status: DC

## 2018-06-30 NOTE — Telephone Encounter (Signed)
Left message on answering machine. 

## 2018-06-30 NOTE — Addendum Note (Signed)
Addended by: Bea Laura on: 06/30/2018 09:56 AM   Modules accepted: Orders

## 2018-07-01 ENCOUNTER — Telehealth: Payer: Self-pay | Admitting: Gastroenterology

## 2018-07-01 MED ORDER — METRONIDAZOLE 250 MG PO TABS: 250.0000 mg | ORAL_TABLET | Freq: Four times a day (QID) | ORAL | 0 refills | Status: DC

## 2018-07-01 MED ORDER — BISMUTH SUBSALICYLATE 262 MG PO CHEW: CHEWABLE_TABLET | ORAL | 0 refills | Status: DC

## 2018-07-01 MED ORDER — DOXYCYCLINE HYCLATE 100 MG PO CAPS: 100.0000 mg | ORAL_CAPSULE | Freq: Two times a day (BID) | ORAL | 0 refills | Status: DC

## 2018-07-01 MED ORDER — OMEPRAZOLE 40 MG PO CPDR: DELAYED_RELEASE_CAPSULE | ORAL | 0 refills | Status: DC

## 2018-07-01 NOTE — Telephone Encounter (Signed)
Will have to send in different medication for patient   Sent in medications to substitute the [facility]    Bismuth 524 mg 1 x QID for 14 days  Flagyl 250 mg 1 x QID for 14 days  Doxycycline 100 mg 1 x BID for 14 days  Omeprazole 40 mg 1 x BID for 14 days   Called pt to inform

## 2018-07-02 ENCOUNTER — Encounter: Payer: Self-pay | Admitting: Gastroenterology

## 2018-07-02 DIAGNOSIS — R0982 Postnasal drip: Secondary | ICD-10-CM | POA: Diagnosis not present

## 2018-07-02 DIAGNOSIS — J328 Other chronic sinusitis: Secondary | ICD-10-CM | POA: Diagnosis not present

## 2018-07-02 DIAGNOSIS — H6123 Impacted cerumen, bilateral: Secondary | ICD-10-CM | POA: Diagnosis not present

## 2018-07-02 DIAGNOSIS — J3489 Other specified disorders of nose and nasal sinuses: Secondary | ICD-10-CM | POA: Diagnosis not present

## 2018-07-02 DIAGNOSIS — J309 Allergic rhinitis, unspecified: Secondary | ICD-10-CM | POA: Diagnosis not present

## 2018-07-02 DIAGNOSIS — J342 Deviated nasal septum: Secondary | ICD-10-CM | POA: Diagnosis not present

## 2018-07-02 DIAGNOSIS — J343 Hypertrophy of nasal turbinates: Secondary | ICD-10-CM | POA: Diagnosis not present

## 2018-07-02 DIAGNOSIS — M8508 Fibrous dysplasia (monostotic), other site: Secondary | ICD-10-CM | POA: Diagnosis not present

## 2018-08-18 DIAGNOSIS — R5383 Other fatigue: Secondary | ICD-10-CM | POA: Diagnosis not present

## 2018-08-18 DIAGNOSIS — N951 Menopausal and female climacteric states: Secondary | ICD-10-CM | POA: Diagnosis not present

## 2018-08-25 DIAGNOSIS — N951 Menopausal and female climacteric states: Secondary | ICD-10-CM | POA: Diagnosis not present

## 2018-08-25 DIAGNOSIS — R232 Flushing: Secondary | ICD-10-CM | POA: Diagnosis not present

## 2018-08-25 DIAGNOSIS — G479 Sleep disorder, unspecified: Secondary | ICD-10-CM | POA: Diagnosis not present

## 2018-09-21 ENCOUNTER — Ambulatory Visit (INDEPENDENT_AMBULATORY_CARE_PROVIDER_SITE_OTHER)
Admission: RE | Admit: 2018-09-21 | Discharge: 2018-09-21 | Disposition: A | Discharge status: Home / Self Care | Payer: Medicare Other | Source: Ambulatory Visit | Attending: Pulmonary Disease | Admitting: Pulmonary Disease

## 2018-09-21 DIAGNOSIS — R918 Other nonspecific abnormal finding of lung field: Secondary | ICD-10-CM

## 2018-09-21 DIAGNOSIS — R911 Solitary pulmonary nodule: Secondary | ICD-10-CM | POA: Diagnosis not present

## 2018-09-21 MED ORDER — IOPAMIDOL (ISOVUE-300) INJECTION 61%
80.0000 mL | Freq: Once | INTRAVENOUS | Status: AC | PRN
  Administered 2018-09-21: 80 mL via INTRAVENOUS

## 2018-10-06 ENCOUNTER — Telehealth: Payer: Self-pay | Admitting: Pulmonary Disease

## 2018-10-06 ENCOUNTER — Encounter: Payer: Self-pay | Admitting: Pulmonary Disease

## 2018-10-06 ENCOUNTER — Ambulatory Visit (INDEPENDENT_AMBULATORY_CARE_PROVIDER_SITE_OTHER): Payer: Medicare Other | Admitting: Pulmonary Disease

## 2018-10-06 VITALS — BP 110/70 | HR 70 | Ht 66.5 in | Wt 194.0 lb

## 2018-10-06 DIAGNOSIS — R911 Solitary pulmonary nodule: Secondary | ICD-10-CM | POA: Diagnosis not present

## 2018-10-06 DIAGNOSIS — E041 Nontoxic single thyroid nodule: Secondary | ICD-10-CM | POA: Diagnosis not present

## 2018-10-06 NOTE — Telephone Encounter (Signed)
Called and spoke with patient she stated that she was calling to schedule her ultrasound.    [facility] please advise, thank you.

## 2018-10-06 NOTE — Assessment & Plan Note (Signed)
Favor hematoma, PET negative, slow growth in size from 10 mm to 15 mm  We will schedule another CT chest with contrast in 9 months

## 2018-10-06 NOTE — Progress Notes (Signed)
   Subjective:    Patient ID: Grace Barajas, female    DOB: 1966/08/02, 52 y.o.   MRN: [id number]  HPI  52 yo never smoker for follow-up of moderate OSA and right lower lobe nodule   42mm Noted incidentally on CT scan when she had community-acquired pneumonia in 2018 This grew in size slowly to 14 mm currently. We reviewed CT chest from 09/21/2018 which shows stable size of nodule.  Another 4 mm nodule was seen in the right upper lobe.  1.5 cm right thyroid nodule was noted which appears to be new  She denies shortness of breath, dyspnea or wheezing. She would not tolerate CPAP machine and this was discontinued   Significant tests/ events reviewed CT chest 02/2018 shows continued growth of right lower lobe nodule now at 14 mm. PET scan did not show any hypermetabolism, there is also symmetrical activity in both tonsils and a groundglass lesion in the sphenoid bone which appears stable from 01/2017   10/2016-CT angiogram showeda right lower lobe 10 mm nodule. There were 2 thyroid nodules 10 mm calcified left lobe and 8 mm noncalcified left lobe and possible adrenal nodule was noted measuring 1.1 cm in the left.  NPSG 06/2014: AHI 15/hr, desat 89% 06/2015 AHI 16   Spirometry 08/2016 was a poor effort but showed normal lung function without airway obstruction  Review of Systems Patient denies significant dyspnea,cough, hemoptysis,  chest pain, palpitations, pedal edema, orthopnea, paroxysmal nocturnal dyspnea, lightheadedness, nausea, vomiting, abdominal or  leg pains      Objective:   Physical Exam  Gen. Pleasant, obese, in no distress ENT - no lesions, no post nasal drip Neck: No JVD, no thyromegaly, no carotid bruits Lungs: no use of accessory muscles, no dullness to percussion, decreased without rales or rhonchi  Cardiovascular: Rhythm regular, heart sounds  normal, no murmurs or gallops, no peripheral edema Musculoskeletal: No deformities, no cyanosis or clubbing ,  no tremors       Assessment & Plan:

## 2018-10-06 NOTE — Patient Instructions (Signed)
Schedule ultrasound of thyroid for right thyroid nodule. CT chest with contrast in 9 months

## 2018-10-06 NOTE — Assessment & Plan Note (Signed)
New right thyroid nodule 1.5 cm was noted and will schedule ultrasound of thyroid to follow-up

## 2018-10-07 NOTE — Telephone Encounter (Signed)
Pt aware of appt.

## 2018-10-07 NOTE — Telephone Encounter (Signed)
I left pt message yesterday and this morning with appt info The ultrasound is scheduled for  10/08/18 arrive at 2:40pm no prep/ she will be there for 45 mins for the appt they want her to bring in her new medicare card and she needs to go to [facility] at [place] location

## 2018-10-08 ENCOUNTER — Ambulatory Visit
Admission: RE | Admit: 2018-10-08 | Discharge: 2018-10-08 | Disposition: A | Discharge status: Home / Self Care | Payer: Medicare Other | Source: Ambulatory Visit | Attending: Pulmonary Disease | Admitting: Pulmonary Disease

## 2018-10-08 DIAGNOSIS — E041 Nontoxic single thyroid nodule: Secondary | ICD-10-CM

## 2018-10-16 ENCOUNTER — Telehealth: Payer: Self-pay | Admitting: Pulmonary Disease

## 2018-10-16 NOTE — Telephone Encounter (Signed)
Call made to patient, made aware of RA recommendations:   Notes recorded by Rigoberto Noel, MD on 10/09/2018 at 1:31 PM EST Nodules appear stable compared to prior ultrasound. 1 year follow-up advised which can be done by PCP or we can refer her to thyroid physician.  Voiced understanding. She states she will f/u with her PCP and she will call if she needs anything else. Nothing further is needed at this time.

## 2018-10-29 ENCOUNTER — Ambulatory Visit: Payer: Medicare Other | Admitting: Family Medicine

## 2018-10-29 ENCOUNTER — Encounter: Payer: Self-pay | Admitting: Family Medicine

## 2018-10-29 VITALS — BP 129/71 | HR 75 | Temp 98.7°F | Ht 66.5 in | Wt 209.4 lb

## 2018-10-29 DIAGNOSIS — J029 Acute pharyngitis, unspecified: Secondary | ICD-10-CM

## 2018-10-29 LAB — VERITOR FLU A/B WAIVED
INFLUENZA A: NEGATIVE
Influenza B: NEGATIVE

## 2018-10-29 NOTE — Progress Notes (Signed)
BP 129/71   Pulse 75   Temp 98.7 F (37.1 C) (Oral)   Ht 5' 6.5" (1.689 m)   Wt 209 lb 6.4 oz (95 kg)   LMP 01/11/2013   BMI 33.29 kg/m    Subjective:    Patient ID: Grace Barajas, female    DOB: 11/10/1965, 53 y.o.   MRN: [id number]  HPI: Grace Barajas is a 53 y.o. female presenting on 10/29/2018 for Sore Throat (x 5 days- states she feels like she has strep); Nasal Congestion; and Cough   HPI Cough and congestion and sore throat Patient comes in complaining of cough and congestion and sore throat this been going on for about 5 days.  She wanted to come in and see if she was strep positive to be tested for it.  She complains of nasal congestion and sinus congestion and pressure in both of her ears but worse on the right than the left.  She also complains of a sore throat and lots of drainage at night but the sore throat is throughout the day.  She denies any fevers or chills or shortness of breath or wheezing.  She denies any sick contacts that she knows of.  She has been using Mucinex and Zyrtec which have been helping some but not significantly.  Relevant past medical, surgical, family and social history reviewed and updated as indicated. Interim medical history since our last visit reviewed. Allergies and medications reviewed and updated.  Review of Systems  Constitutional: Negative for chills and fever.  HENT: Positive for congestion, postnasal drip, rhinorrhea, sinus pressure, sneezing and sore throat. Negative for ear discharge and ear pain.   Eyes: Negative for pain, redness and visual disturbance.  Respiratory: Positive for cough. Negative for chest tightness and shortness of breath.   Cardiovascular: Negative for chest pain and leg swelling.  Musculoskeletal: Negative for back pain and gait problem.  Skin: Negative for rash.  Neurological: Negative for light-headedness and headaches.  Psychiatric/Behavioral: Negative for agitation and behavioral problems.  All  other systems reviewed and are negative.   Per HPI unless specifically indicated above   Allergies as of 10/29/2018      Reactions   Buprenorphine Hcl    "Felt like an addict needing a fix."   Morphine And Related    "Felt like an addict needing a fix."      Medication List       Accurate as of October 29, 2018  2:46 PM. Always use your most recent med list.        Biotin 5 MG Tabs Take by mouth.   buPROPion 300 MG 24 hr tablet Commonly known as:  WELLBUTRIN XL Take 300 mg by mouth daily.   ferrous sulfate 325 (65 FE) MG tablet Take 325 mg by mouth daily with breakfast.   Fish Oil 1000 MG Caps Take 1 capsule by mouth daily.   FLUoxetine 40 MG capsule Commonly known as:  PROZAC Take 40 mg by mouth.   multivitamin with minerals tablet Take 1 tablet by mouth daily.   Vitamin D (Ergocalciferol) 1.25 MG (50000 UT) Caps capsule Commonly known as:  DRISDOL Take 1 capsule (50,000 Units total) by mouth every 7 (seven) days.          Objective:    BP 129/71   Pulse 75   Temp 98.7 F (37.1 C) (Oral)   Ht 5' 6.5" (1.689 m)   Wt 209 lb 6.4 oz (95 kg)   LMP  01/11/2013   BMI 33.29 kg/m   Wt Readings from Last 3 Encounters:  10/29/18 209 lb 6.4 oz (95 kg)  10/06/18 194 lb (88 kg)  04/21/18 199 lb 12.8 oz (90.6 kg)    Physical Exam Vitals signs reviewed.  Constitutional:      General: She is not in acute distress.    Appearance: She is well-developed. She is not diaphoretic.  HENT:     Right Ear: Tympanic membrane, ear canal and external ear normal.     Left Ear: Tympanic membrane, ear canal and external ear normal.     Nose: Mucosal edema and rhinorrhea present.     Right Sinus: No maxillary sinus tenderness or frontal sinus tenderness.     Left Sinus: No maxillary sinus tenderness or frontal sinus tenderness.     Mouth/Throat:     Pharynx: Uvula midline. Posterior oropharyngeal erythema present. No oropharyngeal exudate.     Tonsils: No tonsillar abscesses.   Eyes:     Conjunctiva/sclera: Conjunctivae normal.  Cardiovascular:     Rate and Rhythm: Normal rate and regular rhythm.     Heart sounds: Normal heart sounds. No murmur.  Pulmonary:     Effort: Pulmonary effort is normal. No respiratory distress.     Breath sounds: Normal breath sounds. No wheezing.  Musculoskeletal: Normal range of motion.        General: No tenderness.  Skin:    General: Skin is warm and dry.     Findings: No rash.  Neurological:     Mental Status: She is alert and oriented to person, place, and time.     Coordination: Coordination normal.  Psychiatric:        Behavior: Behavior normal.     Rapid flu negative, rapid strep negative    Assessment & Plan:   Problem List Items Addressed This Visit    None    Visit Diagnoses    Viral pharyngitis    -  Primary   Relevant Orders   Veritor Flu A/B Waived (Completed)   Rapid Strep Screen ([facility] ONLY) (Completed)    Recommended Flonase and continue Mucinex and allergy medicine, call or return if worsens  Follow up plan: No follow-ups on file.  Counseling provided for all of the vaccine components Orders Placed This Encounter  Procedures  . Rapid Strep Screen ([facility] ONLY)  . Culture, Group A Strep  . Veritor Flu A/B [place] Dettinger, MD [facility] 10/29/2018, 2:46 PM

## 2018-10-30 ENCOUNTER — Ambulatory Visit: Payer: Medicare Other | Admitting: Nurse Practitioner

## 2018-10-30 ENCOUNTER — Telehealth: Payer: Self-pay | Admitting: Pulmonary Disease

## 2018-10-30 NOTE — Telephone Encounter (Signed)
Called and spoke with Patient.  She stated that she slept through her appointment that was scheduled with Kenney Houseman, NP, this morning.  She stated that she saw her PCP yesterday and was told her lungs were clear, left ear was slightly red.  She stated that she was not prescribed anything and was told she had a cold.  She has been taking Mucinex, Robitussin, cough drops, drinking fluids, and eating hot chicken soup.  She denies fever.  She stated that she was just concerned that it may get worse.  Appointment made for 11/02/18, with Wyn Quaker, NP. Nothing further at this time.

## 2018-11-02 ENCOUNTER — Ambulatory Visit: Payer: Medicare Other | Admitting: Pulmonary Disease

## 2018-11-02 ENCOUNTER — Encounter: Payer: Self-pay | Admitting: Pulmonary Disease

## 2018-11-02 VITALS — BP 102/60 | HR 82 | Temp 98.8°F | Ht 65.5 in | Wt 209.6 lb

## 2018-11-02 DIAGNOSIS — J029 Acute pharyngitis, unspecified: Secondary | ICD-10-CM | POA: Diagnosis not present

## 2018-11-02 DIAGNOSIS — E042 Nontoxic multinodular goiter: Secondary | ICD-10-CM | POA: Diagnosis not present

## 2018-11-02 DIAGNOSIS — R911 Solitary pulmonary nodule: Secondary | ICD-10-CM

## 2018-11-02 LAB — CULTURE, GROUP A STREP

## 2018-11-02 LAB — STREP COMPLETE PANEL
STREP PYOGENES: NEGATIVE
Strep dysgalactiae: NEGATIVE

## 2018-11-02 LAB — RAPID STREP SCREEN (MED CTR MEBANE ONLY): STREP GP A AG, IA W/REFLEX: NEGATIVE

## 2018-11-02 MED ORDER — HYDROCODONE-HOMATROPINE 5-1.5 MG/5ML PO SYRP: 5.0000 mL | ORAL_SOLUTION | Freq: Four times a day (QID) | ORAL | 0 refills | Status: DC | PRN

## 2018-11-02 MED ORDER — AZITHROMYCIN 500 MG PO TABS
ORAL_TABLET | ORAL | 0 refills | Status: DC
Start: 2018-11-02 — End: 2019-06-30

## 2018-11-02 MED ORDER — PREDNISONE 10 MG PO TABS: ORAL_TABLET | ORAL | 0 refills | Status: DC

## 2018-11-02 NOTE — Patient Instructions (Addendum)
Strep Swab today   Azithromycin 500 mg tablet  >>>Take 1 tablets (500mg  total) for 5 days >>>take with food  >>>can also take probiotic and / or yogurt while on antibiotic   Prednisone 10mg  tablet  >>>4 tabs for 2 days, then 3 tabs for 2 days, 2 tabs for 2 days, then 1 tab for 2 days, then stop >>>take with food  >>>take in the morning   Complete plan for CT in September/2020 prior to office visit  Follow-up with our office in September/2020  Follow-up with our office sooner if symptoms are not improving.  It is flu season:   >>>Remember to be washing your hands regularly, using hand sanitizer, be careful to use around herself with has contact with people who are sick will increase her chances of getting sick yourself. >>> Best ways to protect herself from the flu: Receive the yearly flu vaccine, practice good hand hygiene washing with soap and also using hand sanitizer when available, eat a nutritious meals, get adequate rest, hydrate appropriately   Please contact the office if your symptoms worsen or you have concerns that you are not improving.   Thank you for choosing [facility] for your healthcare, and for allowing [place] to partner with you on your healthcare journey. I am thankful to be able to provide care to you today.   Wyn Quaker FNP-C

## 2018-11-02 NOTE — Progress Notes (Addendum)
@Patient  ID: Grace Barajas, female    DOB: 1966/03/14, 53 y.o.   MRN: [id number]  Chief Complaint  Patient presents with  . Acute Visit    sore throat    Referring provider: Sharion Balloon, FNP  HPI:  53 year old female never smoker followed in our office for moderate obstructive sleep apnea (failed CPAP therapy) and right lower lobe nodule  PMH: Hyperlipidemia, anxiety, depression, obesity, schizoaffective affective, multinodular goiter disorder Smoker/ Smoking History: Never smoker Maintenance: None Pt of: Dr. Elsworth Soho  11/02/2018  - Visit   53 year old female never smoker presenting to our office today for an acute visit.  Patient reports that on 10/22/2018 symptoms of sore throat, congestion, cough started to occur.  Patient denies fevers or chills.  Patient has known sick contacts at Christmas.  Patient presented to family medicine and 10/29/2018 and was treated as viral pharyngitis.  Patient reports that her strep swab was negative.  Patient reports that she was not swabbed very far back in her mouth.  Patient reports that she has not noticed any improvement through the entire course of the illness.  Patient continues to try to use over-the-counter cough medicines but has not worked well.  Patient reports she is struggling with sleep.  And her throat feels like it still very tender and swollen.   Tests:  CT chest 02/2018 shows continued growth of right lower lobe nodule now at 14 mm. >>>PET scan did not show any hypermetabolism, there is also symmetrical activity in both tonsils and a groundglass lesion in the sphenoid bone which appears stable from 01/2017  10/2016-CT angiogram showeda right lower lobe 10 mm nodule. >>>There were 2 thyroid nodules 10 mm calcified left lobe and 8 mm noncalcified left lobe and possible adrenal nodule was noted measuring 1.1 cm in the left.  NPSG 06/2014: AHI 15/hr, desat 89% 06/2015 AHI 16   Spirometry 08/2016 was a poor effort but showed  normal lung function without airway obstruction  10/08/2018-ultrasound of thyroid- similar findings of multinodular goiter, no definitive worrisome or new or enlarging thyroid nodules, previously biopsied nodule within the right lobe of the thyroid is grossly unchanged compared to 11/2016, assuming benign pathological diagnosis repeat sampling and/or continued follow-up is not recommended, nodule 3 is unchanged compared to February/2018 exam though again meet imaging criteria to recommend 1 year follow-up  FENO:  No results found for: NITRICOXIDE  PFT: No flowsheet data found.  Imaging: US Thyroid  Result Date: 10/08/2018 CLINICAL DATA:  Prior ultrasound follow-up. History of right-sided thyroid nodule fine-needle aspiration in 12/19/2016 EXAM: THYROID ULTRASOUND TECHNIQUE: Ultrasound examination of the thyroid gland and adjacent soft tissues was performed. COMPARISON:  11/28/2016; right-sided thyroid nodule fine-needle aspiration-12/19/2016 FINDINGS: Parenchymal Echotexture: Moderately heterogenous Isthmus: Normal in size measures 0.5 cm in diameter, unchanged Right lobe: Normal in size measuring 4.7 x 1.9 x 2.2 cm, unchanged, previously, 4.7 x 1.4 x 2.0 cm Left lobe: Normal in size measuring 4.4 x 1.7 x 1.7 cm, unchanged, previously, 4.1 x 2.0 x 1.4 cm _________________________________________________________ Estimated total number of nodules >/= 1 cm: 4 Number of spongiform nodules >/=  2 cm not described below (TR1): 0 Number of mixed cystic and solid nodules >/= 1.5 cm not described below (TR2): 0 _________________________________________________________ The approximately 0.7 cm isoechoic nodule/pseudonodule within the superior pole the right lobe of the thyroid (labeled 1) is unchanged compared to the 11/2016 examination, previously, 0.8 cm and again does not meet imaging criteria to recommend percutaneous sampling or continued dedicated  follow-up. The previously biopsied ill-defined approximately  1.7 x 1.5 x 1.3 cm nodule within the mid, posterior aspect the right lobe of the thyroid (labeled 2) is grossly unchanged compared to the 11/2016 examination, previously, 1.6 x 1.4 x 1.2 cm, with slight size differences likely total to scan plane projection. Correlation with prior biopsy results is recommended. _________________________________________________________ Nodule # 3: Prior biopsy: No Location: Left; Mid Maximum size: 1.3 cm; Other 2 dimensions: 1.3 x 1.2 cm, previously, 1.4 x 1.3 x 1.2 cm Composition: cannot determine (2) Echogenicity: cannot determine (1) Shape: not taller-than-wide (0) Margins: smooth (0) Echogenic foci: peripheral calcifications (2) ACR TI-RADS total points: 5. ACR TI-RADS risk category:  TR4 (4-6 points). Significant change in size (>/= 20% in two dimensions and minimal increase of 2 mm): No Change in features: No Change in ACR TI-RADS risk category: No ACR TI-RADS recommendations: *Given size (>/= 1 - 1.4 cm) and appearance, a follow-up ultrasound in 1 year should be considered based on TI-RADS criteria. _________________________________________________________ The partially cystic, partially solid approximately 1.1 cm nodule within the inferior pole the left lobe of the thyroid (labeled 4), is unchanged compared to the 11/2016 examination, previously, 1.0 cm, and again does not meet imaging criteria to recommend percutaneous sampling or continued dedicated follow-up. Questioned approximately 1.4 cm nodule within the inferior pole the left lobe of the thyroid (labeled 5) is favored to represent a pseudonodule as it lacks defined borders on both the provided transverse and longitudinal images. IMPRESSION: 1. Similar findings of multinodular goiter. No definitive worrisome new or enlarging thyroid nodules. 2. Previously biopsied nodule within the right lobe of the thyroid (labeled 2), is grossly unchanged compared to the 11/2016 examination. Correlation with prior biopsy results is  recommended. Assuming a benign pathologic diagnosis, repeat sampling and/or continued dedicated follow-up is not recommended. 3. Nodule #3 is unchanged compared to the 11/2016 examination though again meets imaging criteria to recommend a 1 year follow-up. This examination documents 21 months of stability. The above is in keeping with the ACR TI-RADS recommendations - J Am Coll Radiol 2017;14:587-595. Electronically Signed   By: Sandi Mariscal M.D.   On: 10/08/2018 16:43      Specialty Problems      Pulmonary Problems   Dyspnea    Doubt 'true asthma' ? anxiety      OSA (obstructive sleep apnea)    NPSG 06/2014:  AHI 15/hr, desat 89%      Allergic rhinitis   Solitary lung nodule    Fever hematoma, PET negative, slow growth in size from 10 mm to 15 mm      Deviated nasal septum   Hypertrophy of inferior nasal turbinate   Pharyngitis      Allergies  Allergen Reactions  . Morphine And Related     "Felt like an addict needing a fix."    Immunization History  Administered Date(s) Administered  . Influenza,inj,Quad PF,6+ Mos 08/29/2014, 01/02/2015, 09/12/2016  . Influenza-Unspecified 12/20/2015  . Tdap 09/24/2008   Patient declined flu vaccine  Past Medical History:  Diagnosis Date  . Allergy    SEASONAL  . Anxiety   . Depression   . GERD (gastroesophageal reflux disease)   . High cholesterol   . Pneumonia   . Pneumonia   . Sinus disorder   . Sleep apnea    CPAP  . Sleep apnea with use of continuous positive airway pressure (CPAP)   . Snores   . Wears glasses     Tobacco History:  Social History   Tobacco Use  Smoking Status Never Smoker  Smokeless Tobacco Never Used   Counseling given: Not Answered   Outpatient Encounter Medications as of 11/02/2018  Medication Sig  . Biotin 5 MG TABS Take by mouth.  Marland Kitchen buPROPion (WELLBUTRIN XL) 300 MG 24 hr tablet Take 300 mg by mouth daily.  . ferrous sulfate 325 (65 FE) MG tablet Take 325 mg by mouth daily with breakfast.   . FLUoxetine (PROZAC) 40 MG capsule Take 40 mg by mouth.  . Multiple Vitamins-Minerals (MULTIVITAMIN WITH MINERALS) tablet Take 1 tablet by mouth daily.  . Omega-3 Fatty Acids (FISH OIL) 1000 MG CAPS Take 1 capsule by mouth daily.   . Vitamin D, Ergocalciferol, (DRISDOL) 50000 units CAPS capsule Take 1 capsule (50,000 Units total) by mouth every 7 (seven) days.  Marland Kitchen azithromycin (ZITHROMAX) 500 MG tablet Take 1 tablet (500mg ) daily for 5 days  . HYDROcodone-homatropine (HYCODAN) 5-1.5 MG/5ML syrup Take 5 mLs by mouth every 6 (six) hours as needed for cough.  . predniSONE (DELTASONE) 10 MG tablet 4 tabs for 2 days, then 3 tabs for 2 days, 2 tabs for 2 days, then 1 tab for 2 days, then stop   No facility-administered encounter medications on file as of 11/02/2018.      Review of Systems  Review of Systems  Constitutional: Negative for chills, fatigue and fever.  HENT: Positive for congestion, postnasal drip, sore throat and trouble swallowing (sore throat ). Negative for sinus pressure and sinus pain.   Respiratory: Positive for cough (productive cough - usually clear).   Cardiovascular: Negative for chest pain and palpitations.  Gastrointestinal: Negative for diarrhea, nausea and vomiting.       Hx of HPylori     Physical Exam  BP 102/60 (BP Location: Left Arm, Cuff Size: Normal)   Pulse 82   Temp 98.8 F (37.1 C) (Oral)   Ht 5' 5.5" (1.664 m)   Wt 209 lb 9.6 oz (95.1 kg)   LMP 01/11/2013   SpO2 94%   BMI 34.35 kg/m   Wt Readings from Last 5 Encounters:  11/02/18 209 lb 9.6 oz (95.1 kg)  10/29/18 209 lb 6.4 oz (95 kg)  10/06/18 194 lb (88 kg)  04/21/18 199 lb 12.8 oz (90.6 kg)  03/19/18 202 lb (91.6 kg)     Physical Exam  Constitutional: She is oriented to person, place, and time and well-developed, well-nourished, and in no distress. No distress.  HENT:  Head: Normocephalic and atraumatic.  Right Ear: Hearing, tympanic membrane, external ear and ear canal normal.  Left  Ear: Hearing, tympanic membrane, external ear and ear canal normal.  Nose: Nose normal. Right sinus exhibits no maxillary sinus tenderness and no frontal sinus tenderness. Left sinus exhibits no maxillary sinus tenderness and no frontal sinus tenderness.  Mouth/Throat: Uvula is midline. Dental caries present. Oropharyngeal exudate and posterior oropharyngeal erythema present.    Eyes: Pupils are equal, round, and reactive to light.  Neck: Normal range of motion. Neck supple.  Cardiovascular: Normal rate, regular rhythm and normal heart sounds.  Pulmonary/Chest: Effort normal and breath sounds normal. No accessory muscle usage. No respiratory distress. She has no decreased breath sounds. She has no wheezes. She has no rhonchi.  Musculoskeletal: Normal range of motion.        General: No edema.  Lymphadenopathy:       Head (right side): Tonsillar adenopathy present.       Head (left side): Tonsillar adenopathy present.  She has no cervical adenopathy.  Neurological: She is alert and oriented to person, place, and time. Gait normal.  Skin: Skin is warm and dry. She is not diaphoretic. No erythema.  Psychiatric: Mood, memory, affect and judgment normal.  Nursing note and vitals reviewed.     Lab Results:  CBC    Component Value Date/Time   WBC 5.6 03/12/2018 1255   WBC 7.9 11/27/2016 1402   WBC 7.2 11/10/2016 0640   RBC 4.25 03/12/2018 1255   RBC 4.55 11/27/2016 1402   RBC 4.29 11/10/2016 0640   HGB 12.1 03/12/2018 1255   HGB 13.5 11/27/2016 1402   HCT 36.8 03/12/2018 1255   HCT 39.5 11/27/2016 1402   PLT 417 (H) 03/12/2018 1255   MCV 87 03/12/2018 1255   MCV 86.8 11/27/2016 1402   MCH 28.5 03/12/2018 1255   MCH 29.6 11/27/2016 1402   MCH 29.1 11/10/2016 0640   MCHC 32.9 03/12/2018 1255   MCHC 34.1 11/27/2016 1402   MCHC 34.2 11/10/2016 0640   RDW 14.4 03/12/2018 1255   RDW 14.4 11/27/2016 1402   LYMPHSABS 2.8 03/12/2018 1255   LYMPHSABS 3.0 11/27/2016 1402   MONOABS  0.5 11/27/2016 1402   EOSABS 0.3 03/12/2018 1255   BASOSABS 0.0 03/12/2018 1255   BASOSABS 0.1 11/27/2016 1402    BMET    Component Value Date/Time   NA 142 03/12/2018 1255   NA 145 11/27/2016 1402   K 4.2 03/12/2018 1255   K 3.9 11/27/2016 1402   CL 103 03/12/2018 1255   CO2 25 03/12/2018 1255   CO2 30 (H) 11/27/2016 1402   GLUCOSE 108 (H) 03/12/2018 1255   GLUCOSE 128 11/27/2016 1402   BUN 15 03/12/2018 1255   BUN 13.7 11/27/2016 1402   CREATININE 0.64 03/12/2018 1255   CREATININE 0.8 11/27/2016 1402   CALCIUM 10.2 03/12/2018 1255   CALCIUM 10.7 (H) 11/27/2016 1402   GFRNONAA 103 03/12/2018 1255   GFRAA 119 03/12/2018 1255    BNP No results found for: BNP  ProBNP No results found for: PROBNP    Assessment & Plan:    Pharyngitis Assessment: Sore throat for 10 days Post oropharyngeal exudate visualized as well as erythema Mallampati 3/4 which may have affected initial swab Palpable and swollen tonsillar lymph nodes that are tender Continued cough, patient has failed Tessalon Perles  Plan: Strep swab with culture Azithromycin Prednisone Follow-up with our office if symptoms are not improving or primary care Keep follow-up with our office in September/2020 after completing CT chest as initially planned Hycodan cough syrup for cough at night Over-the-counter Delsym or Robitussin for cough during the day    Solitary lung nodule Plan: Continue with September/2020 CT to follow lung nodule Follow-up with Dr. Elsworth Soho in September/2020 after completion CT  Multinodular goiter Plan: Patient is completed December/2019 ultrasound of thyroid Patient to continue follow-up with primary care May need December/2020 assessment of thyroid due to #3 nodule  Addendum: 11/02/2018 Before prescribing the patient with Hycodan cough syrup, I have checked [place] PMP aware and the patients overdose risk score is 0. Patient has 0 providers prescribing controlled substances. Patient  has used 0 pharmacies. I have counseled the patient on the sedative effects of Hycodan cough syrup. Patient to use this medication sparingly and not when driving, drinking alcohol, or using additional sedative medications. Patient has been prescribed 230ml with no refills.    Lauraine Rinne, NP 11/02/2018   This appointment was 28 min long with over 50%  of the time in direct face-to-face patient care, assessment, plan of care, and follow-up.

## 2018-11-02 NOTE — Assessment & Plan Note (Signed)
Plan: Patient is completed December/2019 ultrasound of thyroid Patient to continue follow-up with primary care May need December/2020 assessment of thyroid due to #3 nodule

## 2018-11-02 NOTE — Assessment & Plan Note (Signed)
Plan: Continue with September/2020 CT to follow lung nodule Follow-up with Dr. Elsworth Soho in September/2020 after completion CT

## 2018-11-02 NOTE — Progress Notes (Signed)
Please let patient know that her strep swabs were negative.  Patient can continue on azithromycin which can treat her as a bronchitis.  Wyn Quaker, FNP

## 2018-11-02 NOTE — Assessment & Plan Note (Signed)
Assessment: Sore throat for 10 days Post oropharyngeal exudate visualized as well as erythema Mallampati 3/4 which may have affected initial swab Palpable and swollen tonsillar lymph nodes that are tender Continued cough, patient has failed Tessalon Perles  Plan: Strep swab with culture Azithromycin Prednisone Follow-up with our office if symptoms are not improving or primary care Keep follow-up with our office in September/2020 after completing CT chest as initially planned Hycodan cough syrup for cough at night Over-the-counter Delsym or Robitussin for cough during the day

## 2018-12-28 ENCOUNTER — Telehealth: Payer: Self-pay | Admitting: Pulmonary Disease

## 2018-12-28 ENCOUNTER — Encounter (HOSPITAL_COMMUNITY): Payer: Self-pay | Admitting: Emergency Medicine

## 2018-12-28 ENCOUNTER — Ambulatory Visit (HOSPITAL_COMMUNITY)
Admission: EM | Admit: 2018-12-28 | Discharge: 2018-12-28 | Disposition: A | Discharge status: Home / Self Care | Payer: Medicare Other | Attending: Family Medicine | Admitting: Family Medicine

## 2018-12-28 DIAGNOSIS — R531 Weakness: Secondary | ICD-10-CM | POA: Insufficient documentation

## 2018-12-28 DIAGNOSIS — G4733 Obstructive sleep apnea (adult) (pediatric): Secondary | ICD-10-CM

## 2018-12-28 LAB — POCT URINALYSIS DIP (DEVICE)
Bilirubin Urine: NEGATIVE
Glucose, UA: NEGATIVE mg/dL
Ketones, ur: NEGATIVE mg/dL
Nitrite: NEGATIVE
PROTEIN: NEGATIVE mg/dL
Specific Gravity, Urine: 1.015 (ref 1.005–1.030)
Urobilinogen, UA: 0.2 mg/dL (ref 0.0–1.0)
pH: 7 (ref 5.0–8.0)

## 2018-12-28 LAB — BASIC METABOLIC PANEL
Anion gap: 9 (ref 5–15)
BUN: 11 mg/dL (ref 6–20)
CALCIUM: 10.3 mg/dL (ref 8.9–10.3)
CO2: 27 mmol/L (ref 22–32)
Chloride: 103 mmol/L (ref 98–111)
Creatinine, Ser: 0.82 mg/dL (ref 0.44–1.00)
GFR calc non Af Amer: >60 mL/min (ref >60)
Glucose, Bld: 113 mg/dL — ABNORMAL HIGH (ref 70–99)
Potassium: 3.5 mmol/L (ref 3.5–5.1)
Sodium: 139 mmol/L (ref 135–145)

## 2018-12-28 LAB — CBC
HCT: 40.4 % (ref 36.0–46.0)
Hemoglobin: 13.3 g/dL (ref 12.0–15.0)
MCH: 28.9 pg (ref 26.0–34.0)
MCHC: 32.9 g/dL (ref 30.0–36.0)
MCV: 87.6 fL (ref 80.0–100.0)
Platelets: 397 10*3/uL (ref 150–400)
RBC: 4.61 MIL/uL (ref 3.87–5.11)
RDW: 13 % (ref 11.5–15.5)
WBC: 5.3 10*3/uL (ref 4.0–10.5)
nRBC: 0 % (ref 0.0–0.2)

## 2018-12-28 NOTE — ED Triage Notes (Signed)
Pt sts generalized weakness x 3 days

## 2018-12-28 NOTE — Discharge Instructions (Signed)
We will call you with the results of the blood work if abnormal Please continue to drink plenty of fluids, may try Pedialyte or Gatorade to supplement if not eating a lot of solids Please follow-up with your primary care if symptoms persist Follow-up in emergency room if developing worsening weakness, dizziness

## 2018-12-28 NOTE — Telephone Encounter (Signed)
Called and spoke with patient she stated that she is needing a prescription for CPAP supplies. Patient uses [facility]. RA please advise if we can send one in, thank you.

## 2018-12-29 DIAGNOSIS — G4733 Obstructive sleep apnea (adult) (pediatric): Secondary | ICD-10-CM | POA: Diagnosis not present

## 2018-12-29 LAB — URINE CULTURE

## 2018-12-29 NOTE — Telephone Encounter (Signed)
OK to send.

## 2018-12-29 NOTE — Telephone Encounter (Signed)
Spoke with pt, advised her that the order for CPAP supplies was sent to [facility]. She understood and nothing further is needed.

## 2018-12-29 NOTE — ED Provider Notes (Signed)
[facility]    CSN: [id number] Arrival date & time: 12/28/18  1159     History   Chief Complaint Chief Complaint  Patient presents with  . Weakness    HPI Grace Barajas is a 53 y.o. female history of OSA, GAD, hyperlipidemia, vitamin D deficiency presenting today for evaluation of generalized weakness and fatigue.  Patient states that her symptoms began approximately 3 days ago.  She relates her symptoms starting after she ate at [employer].  She overall has just felt weak, and slightly lightheaded.  She has had no nausea vomiting or diarrhea.  She denies any URI symptoms of congestion cough or sore throat.  She denies abdominal pain.  Denies urinary symptoms of burning or, frequently.  She denies any recent changes in her diet.  She does note that she does not drink a significant amount of water/fluids.  She does admit to not wearing her CPAP.  She also notes that she is not recently been receiving injections for her vitamin D.  Denies any recent changes in medicines.  Denies any fevers.  HPI  Past Medical History:  Diagnosis Date  . Allergy    SEASONAL  . Anxiety   . Depression   . GERD (gastroesophageal reflux disease)   . High cholesterol   . Pneumonia   . Pneumonia   . Sinus disorder   . Sleep apnea    CPAP  . Sleep apnea with use of continuous positive airway pressure (CPAP)   . Snores   . Wears glasses     Patient Active Problem List   Diagnosis Date Noted  . Pharyngitis 11/02/2018  . Anxiety 02/11/2018  . Vitamin D deficiency 02/28/2017  . Hypertrophy of inferior nasal turbinate 01/22/2017  . Deviated nasal septum 01/22/2017  . Multinodular goiter 12/06/2016  . Solitary lung nodule 11/18/2016  . Obesity (BMI 30.0-34.9) 01/23/2016  . Hyperlipidemia 01/23/2016  . Allergic rhinitis 09/04/2015  . OSA (obstructive sleep apnea) 07/29/2014  . Bilateral low back pain 05/27/2014  . Depression 05/23/2014  . GAD (generalized anxiety disorder)  05/23/2014  . Dyspnea 12/30/2011  . Schizo-affective psychosis ([place]) 08/08/2011  . Schizoaffective disorder, depressive type ([place]) 08/08/2011    Past Surgical History:  Procedure Laterality Date  . BILATERAL SALPINGECTOMY Bilateral 01/21/2013   Procedure: BILATERAL SALPINGECTOMY;  Surgeon: Marvene Staff, MD;  Location: [facility];  Service: Gynecology;  Laterality: Bilateral;  . CARPAL TUNNEL RELEASE Left 07/03/2007  . CARPAL TUNNEL RELEASE Right 07/31/2007  . CERVICAL FUSION     C1/C2  . DORSAL COMPARTMENT RELEASE Bilateral 01/13/2014   Procedure: BILATERAL 1ST DORSAL COMPARTMENT RELEASES;  Surgeon: Cammie Sickle., MD;  Location: [facility];  Service: Orthopedics;  Laterality: Bilateral;  bilateral  . ROBOTIC ASSISTED TOTAL HYSTERECTOMY N/A 01/21/2013   Procedure: ROBOTIC ASSISTED TOTAL HYSTERECTOMY;  Surgeon: Marvene Staff, MD;  Location: [facility];  Service: Gynecology;  Laterality: N/A;  . TUBAL LIGATION      OB History   No obstetric history on file.      Home Medications    Prior to Admission medications   Medication Sig Start Date End Date Taking? Authorizing Provider  azithromycin (ZITHROMAX) 500 MG tablet Take 1 tablet (500mg ) daily for 5 days 11/02/18   Lauraine Rinne, NP  Biotin 5 MG TABS Take by mouth.    [provider]  buPROPion (WELLBUTRIN XL) 300 MG 24 hr tablet Take 300 mg by mouth daily. 01/26/18   [provider]  ferrous sulfate 325 (65 FE) MG tablet Take 325 mg by mouth daily with breakfast.    [provider]  FLUoxetine (PROZAC) 40 MG capsule Take 40 mg by mouth. 12/27/16   [provider]  HYDROcodone-homatropine (HYCODAN) 5-1.5 MG/5ML syrup Take 5 mLs by mouth every 6 (six) hours as needed for cough. 11/02/18   Lauraine Rinne, NP  Multiple Vitamins-Minerals (MULTIVITAMIN WITH MINERALS) tablet Take 1 tablet by mouth daily.    [provider]  Omega-3 Fatty Acids (FISH OIL) 1000 MG CAPS Take 1  capsule by mouth daily.     [provider]  predniSONE (DELTASONE) 10 MG tablet 4 tabs for 2 days, then 3 tabs for 2 days, 2 tabs for 2 days, then 1 tab for 2 days, then stop 11/02/18   Lauraine Rinne, NP  Vitamin D, Ergocalciferol, (DRISDOL) 50000 units CAPS capsule Take 1 capsule (50,000 Units total) by mouth every 7 (seven) days. 03/17/18   Sharion Balloon, FNP    Family History Family History  Problem Relation Age of Onset  . Asthma Son   . Asthma Brother   . Obesity Father   . Diabetes Father   . Heart disease Father   . Hyperlipidemia Father   . Diabetes Maternal Grandmother   . Diabetes Maternal Aunt   . Diabetes Maternal Uncle   . Colon polyps Maternal Uncle   . Thyroid disease Neg Hx   . Breast cancer Neg Hx     Social History Social History   Tobacco Use  . Smoking status: Never Smoker  . Smokeless tobacco: Never Used  Substance Use Topics  . Alcohol use: Yes    Comment: social  . Drug use: No     Allergies   Morphine and related   Review of Systems Review of Systems  Constitutional: Positive for fatigue. Negative for activity change, appetite change and fever.  HENT: Negative for congestion, sinus pressure and sore throat.   Eyes: Negative for photophobia, pain and visual disturbance.  Respiratory: Negative for cough and shortness of breath.   Cardiovascular: Negative for chest pain.  Gastrointestinal: Negative for abdominal pain, nausea and vomiting.  Genitourinary: Negative for decreased urine volume and hematuria.  Musculoskeletal: Negative for myalgias, neck pain and neck stiffness.  Neurological: Positive for weakness. Negative for dizziness, syncope, facial asymmetry, speech difficulty, light-headedness, numbness and headaches.     Physical Exam Triage Vital Signs ED Triage Vitals [12/28/18 1235]  Enc Vitals Group     BP 137/76     Pulse Rate 99     Resp 18     Temp 98.8 F (37.1 C)     Temp Source Temporal     SpO2 100 %      Weight      Height      Head Circumference      Peak Flow      Pain Score 0     Pain Loc      Pain Edu?      Excl. in [place]?    No data found.  Updated Vital Signs BP 137/76 (BP Location: Left Arm)   Pulse 99   Temp 98.8 F (37.1 C) (Temporal)   Resp 18   LMP 01/11/2013   SpO2 100%   Visual Acuity Right Eye Distance:   Left Eye Distance:   Bilateral Distance:    Right Eye Near:   Left Eye Near:    Bilateral Near:  Physical Exam Vitals signs and nursing note reviewed.  Constitutional:      General: She is not in acute distress.    Appearance: She is well-developed.  HENT:     Head: Normocephalic and atraumatic.     Ears:     Comments: Bilateral ears without tenderness to palpation of external auricle, tragus and mastoid, EAC's without erythema or swelling, TM's with good bony landmarks and cone of light. Non erythematous.    Mouth/Throat:     Comments: Oral mucosa pink and moist, no tonsillar enlargement or exudate. Posterior pharynx patent and nonerythematous, no uvula deviation or swelling. Normal phonation. Eyes:     Conjunctiva/sclera: Conjunctivae normal.  Neck:     Musculoskeletal: Neck supple.  Cardiovascular:     Rate and Rhythm: Normal rate and regular rhythm.     Heart sounds: No murmur.  Pulmonary:     Effort: Pulmonary effort is normal. No respiratory distress.     Breath sounds: Normal breath sounds.     Comments: Breathing comfortably at rest, CTABL, no wheezing, rales or other adventitious sounds auscultated Abdominal:     Palpations: Abdomen is soft.     Tenderness: There is no abdominal tenderness.  Skin:    General: Skin is warm and dry.  Neurological:     Mental Status: She is alert.     Comments: Patient A&O x3, cranial nerves II-XII grossly intact, strength at shoulders, hips and knees 5/5, equal bilaterally, patellar reflex 2+ bilaterally.Gait without abnormality.      UC Treatments / Results  Labs (all labs ordered are listed, but  only abnormal results are displayed) Labs Reviewed  BASIC METABOLIC PANEL - Abnormal; Notable for the following components:      Result Value   Glucose, Bld 113 (*)    All other components within normal limits  POCT URINALYSIS DIP (DEVICE) - Abnormal; Notable for the following components:   Hgb urine dipstick TRACE (*)    Leukocytes,Ua LARGE (*)    All other components within normal limits  URINE CULTURE  CBC    EKG None  Radiology No results found.  Procedures Procedures (including critical care time)  Medications Ordered in UC Medications - No data to display  Initial Impression / Assessment and Plan / UC Course  I have reviewed the triage vital signs and the nursing notes.  Pertinent labs & imaging results that were available during my care of the patient were reviewed by me and considered in my medical decision making (see chart for details).    Strength intact, no neuro deficits.  Vital signs stable. BMP and CBC obtained to check for abnormalities and electrolytes, UA with trace hemoglobin, large leuks.  Will send for culture, without UTI symptoms at this time.  Patient does not appear dehydrated, will recommend increasing fluid intake.  Possible viral etiology causing 3 days of fatigue.   Also discussed possible relation to not using CPAP and not restful sleep related to this.  Following up with PCP for further evaluation if symptoms persisting.  Discussed strict return precautions. Patient verbalized understanding and is agreeable with plan.  Final Clinical Impressions(s) / UC Diagnoses   Final diagnoses:  Generalized weakness     Discharge Instructions     We will call you with the results of the blood work if abnormal Please continue to drink plenty of fluids, may try Pedialyte or Gatorade to supplement if not eating a lot of solids Please follow-up with your primary care if symptoms  persist Follow-up in emergency room if developing worsening weakness,  dizziness   ED Prescriptions    None     Controlled Substance Prescriptions [place] Controlled Substance Registry consulted? Not Applicable   Janith Lima, [place] 12/29/18 1053

## 2018-12-30 ENCOUNTER — Telehealth (HOSPITAL_COMMUNITY): Payer: Self-pay | Admitting: Emergency Medicine

## 2018-12-30 NOTE — Telephone Encounter (Signed)
Spoke with pt and verbalized understanding of normal lab work

## 2019-01-07 DIAGNOSIS — H918X3 Other specified hearing loss, bilateral: Secondary | ICD-10-CM | POA: Diagnosis not present

## 2019-01-07 DIAGNOSIS — M8508 Fibrous dysplasia (monostotic), other site: Secondary | ICD-10-CM | POA: Diagnosis not present

## 2019-01-07 DIAGNOSIS — J320 Chronic maxillary sinusitis: Secondary | ICD-10-CM | POA: Diagnosis not present

## 2019-01-07 DIAGNOSIS — J329 Chronic sinusitis, unspecified: Secondary | ICD-10-CM | POA: Diagnosis not present

## 2019-01-07 DIAGNOSIS — H938X3 Other specified disorders of ear, bilateral: Secondary | ICD-10-CM | POA: Diagnosis not present

## 2019-01-07 DIAGNOSIS — J309 Allergic rhinitis, unspecified: Secondary | ICD-10-CM | POA: Diagnosis not present

## 2019-01-07 DIAGNOSIS — K1121 Acute sialoadenitis: Secondary | ICD-10-CM | POA: Diagnosis not present

## 2019-01-07 DIAGNOSIS — J343 Hypertrophy of nasal turbinates: Secondary | ICD-10-CM | POA: Diagnosis not present

## 2019-01-07 DIAGNOSIS — J322 Chronic ethmoidal sinusitis: Secondary | ICD-10-CM | POA: Diagnosis not present

## 2019-01-07 DIAGNOSIS — H903 Sensorineural hearing loss, bilateral: Secondary | ICD-10-CM | POA: Diagnosis not present

## 2019-01-07 DIAGNOSIS — J342 Deviated nasal septum: Secondary | ICD-10-CM | POA: Diagnosis not present

## 2019-01-07 DIAGNOSIS — J328 Other chronic sinusitis: Secondary | ICD-10-CM | POA: Diagnosis not present

## 2019-03-16 DIAGNOSIS — H6993 Unspecified Eustachian tube disorder, bilateral: Secondary | ICD-10-CM | POA: Diagnosis not present

## 2019-03-16 DIAGNOSIS — H9011 Conductive hearing loss, unilateral, right ear, with unrestricted hearing on the contralateral side: Secondary | ICD-10-CM | POA: Diagnosis not present

## 2019-03-16 DIAGNOSIS — H6983 Other specified disorders of Eustachian tube, bilateral: Secondary | ICD-10-CM | POA: Diagnosis not present

## 2019-03-16 DIAGNOSIS — H9201 Otalgia, right ear: Secondary | ICD-10-CM | POA: Diagnosis not present

## 2019-03-16 DIAGNOSIS — G501 Atypical facial pain: Secondary | ICD-10-CM | POA: Diagnosis not present

## 2019-03-16 DIAGNOSIS — R51 Headache: Secondary | ICD-10-CM | POA: Diagnosis not present

## 2019-03-16 DIAGNOSIS — H9042 Sensorineural hearing loss, unilateral, left ear, with unrestricted hearing on the contralateral side: Secondary | ICD-10-CM | POA: Diagnosis not present

## 2019-05-18 ENCOUNTER — Telehealth: Payer: Self-pay | Admitting: Pulmonary Disease

## 2019-05-18 NOTE — Telephone Encounter (Signed)
Pt will need to be scheduled follow up appt either with RA or APP after CT scan.  Attempted to call pt to let her know that we did need to schedule a f/u appt but unable to reach pt. Left message for pt to return call.  When pt returns call, please schedule pt for a follow up appt after CT (which is scheduled for 8/31). appt either with Dr. Elsworth Soho or APP. Thanks!

## 2019-05-31 ENCOUNTER — Encounter: Payer: Self-pay | Admitting: Gastroenterology

## 2019-06-24 NOTE — Telephone Encounter (Signed)
Called and spoke to pt. Pt has CT chest for 8/31, appt with Aaron Edelman has been made for 06/30/2019. Pt verbalized understanding and denied any further questions or concerns at this time.

## 2019-06-26 HISTORY — PX: COLONOSCOPY: SHX174

## 2019-06-28 ENCOUNTER — Ambulatory Visit (INDEPENDENT_AMBULATORY_CARE_PROVIDER_SITE_OTHER)
Admission: RE | Admit: 2019-06-28 | Discharge: 2019-06-28 | Disposition: A | Discharge status: Home / Self Care | Payer: Medicare Other | Source: Ambulatory Visit | Attending: Pulmonary Disease | Admitting: Pulmonary Disease

## 2019-06-28 ENCOUNTER — Other Ambulatory Visit: Payer: Self-pay

## 2019-06-28 DIAGNOSIS — R918 Other nonspecific abnormal finding of lung field: Secondary | ICD-10-CM | POA: Diagnosis not present

## 2019-06-28 DIAGNOSIS — R911 Solitary pulmonary nodule: Secondary | ICD-10-CM

## 2019-06-28 MED ORDER — IOHEXOL 300 MG/ML  SOLN
80.0000 mL | Freq: Once | INTRAMUSCULAR | Status: AC | PRN
  Administered 2019-06-28: 80 mL via INTRAVENOUS

## 2019-06-29 NOTE — Progress Notes (Signed)
@Patient  ID: Grace Barajas, female    DOB: [birth year], 53 y.o.   MRN: [id number]  Chief Complaint  Patient presents with   Follow-up    breathing stable     Referring provider: Sharion Balloon, FNP  HPI:  53 year old female never smoker followed in our office for moderate obstructive sleep apnea (failed CPAP therapy) and right lower lobe nodule  PMH: Hyperlipidemia, anxiety, depression, obesity, schizoaffective affective, multinodular goiter disorder Smoker/ Smoking History: Never smoker Maintenance: None Pt of: Dr. Elsworth Soho  06/30/2019  - Visit   53 year old female never smoker followed in our office for moderate obstructive sleep apnea as well as a right lower lobe nodule.  Right lower lobe nodule was PET negative in 2019.  Patient recently completed a follow-up CT following this nodule.  Those results are listed below:  06/28/2019-CT chest with contrast- continued slow interval growth of right lower lobe pulmonary nodule, findings may represent slowly growing benign pulmonary nodule versus low-grade indolent pulmonary neoplasm, stable right upper lobe pulmonary nodule within the posterior right upper lobe, right lower lobe pulmonary nodular nodule measuring 1.6 x 1.3 x 1.3 cm on previous exam this measured 1.5 x 0.9 x 1.2 cm.   Patient CPAP compliance is poor.  CPAP compliance report today listed below:  05/26/2019-06/24/2019 20-10 on the last 30 days used, 9 of those days greater than 4 hours, average usage 4 hours and 57 minutes, APAP pressures 8-15, 95th percentile 10, AHI 0.3  Patient admits that she has not been wearing her CPAP.  She has no reason for this.  Patient admits that she just forgets.  She reports that she will resume use.   Tests:   CT chest 02/2018 shows continued growth of right lower lobe nodule now at 14 mm. >>>PET scan did not show any hypermetabolism, there is also symmetrical activity in both tonsils and a groundglass lesion in the sphenoid bone which  appears stable from 01/2017  10/2016-CT angiogram showeda right lower lobe 10 mm nodule. >>>There were 2 thyroid nodules 10 mm calcified left lobe and 8 mm noncalcified left lobe and possible adrenal nodule was noted measuring 1.1 cm in the left.   06/28/2019-CT chest with contrast- continued slow interval growth of right lower lobe pulmonary nodule, findings may represent slowly growing benign pulmonary nodule versus low-grade indolent pulmonary neoplasm, stable right upper lobe pulmonary nodule within the posterior right upper lobe, right lower lobe pulmonary nodular nodule measuring 1.6 x 1.3 x 1.3 cm on previous exam this measured 1.5 x 0.9 x 1.2 cm.  NPSG 06/2014: AHI 15/hr, desat 89% 06/2015 AHI 16   Spirometry 08/2016 was a poor effort but showed normal lung function without airway obstruction  10/08/2018-ultrasound of thyroid- similar findings of multinodular goiter, no definitive worrisome or new or enlarging thyroid nodules, previously biopsied nodule within the right lobe of the thyroid is grossly unchanged compared to 11/2016, assuming benign pathological diagnosis repeat sampling and/or continued follow-up is not recommended, nodule 3 is unchanged compared to February/2018 exam though again meet imaging criteria to recommend 1 year follow-up  FENO:  No results found for: NITRICOXIDE  PFT: No flowsheet data found.  Imaging: Ct Chest W Contrast  Result Date: 06/28/2019 CLINICAL DATA:  Follow-up lung nodule. EXAM: CT CHEST WITH CONTRAST TECHNIQUE: Multidetector CT imaging of the chest was performed during intravenous contrast administration. CONTRAST:  55mL OMNIPAQUE IOHEXOL 300 MG/ML  SOLN COMPARISON:  09/21/2018 FINDINGS: Cardiovascular: Normal heart size. No pericardial effusion. Aortic atherosclerosis. Mediastinum/Nodes: Bilateral  thyroid nodules. The trachea appears patent and is midline. Normal appearance of the esophagus. The no enlarged mediastinal or hilar lymph nodes.  Lungs/Pleura: No pleural effusion identified. Pulmonary nodule in the central right lower lobe measures 1.6 x 1.3 by 1.3 cm (volume = 1.4 cm^3), image 83/3 and image 85/5. On the previous exam this measured 1.5 x 0.9 by 1.2 cm (volume = 0.8 cm^3). On 03/18/2017 this nodule measured 1.1 x 0.9 by 0.9 cm (volume = 0.5 cm^3). Unchanged 4 mm nodule in the posterior right upper lobe, image 39/3. Upper Abdomen: No acute abnormality. Musculoskeletal: No chest wall abnormality. No acute or significant osseous findings. IMPRESSION: 1. Continued slow interval growth of right lower lobe pulmonary nodule. Findings may represent slowly growing benign pulmonary nodule versus low-grade, indolent pulmonary neoplasm. 2. Stable right upper lobe pulmonary nodule within the posterior right upper lobe. 3.  Aortic Atherosclerosis (ICD10-I70.0). Electronically Signed   By: Kerby Moors M.D.   On: 06/28/2019 09:46      Specialty Problems      Pulmonary Problems   Dyspnea    Doubt 'true asthma' ? anxiety      OSA (obstructive sleep apnea)    NPSG 06/2014:  AHI 15/hr, desat 89%      Allergic rhinitis   Solitary lung nodule    Fever hematoma, PET negative, slow growth in size from 10 mm to 15 mm  06/28/2019-CT chest with contrast- continued slow interval growth of right lower lobe pulmonary nodule, findings may represent slowly growing benign pulmonary nodule versus low-grade indolent pulmonary neoplasm, stable right upper lobe pulmonary nodule within the posterior right upper lobe, right lower lobe pulmonary nodular nodule measuring 1.6 x 1.3 x 1.3 cm on previous exam this measured 1.5 x 0.9 x 1.2 cm.      Deviated nasal septum   Hypertrophy of inferior nasal turbinate   Pharyngitis      Allergies  Allergen Reactions   Morphine And Related     "Felt like an addict needing a fix."    Immunization History  Administered Date(s) Administered   Influenza,inj,Quad PF,6+ Mos 08/29/2014, 01/02/2015, 09/12/2016    Influenza-Unspecified 12/20/2015   Tdap 09/24/2008, 10/22/2012   Due for flu vaccine today, pt declined   Past Medical History:  Diagnosis Date   Allergy    SEASONAL   Anxiety    Depression    GERD (gastroesophageal reflux disease)    High cholesterol    Pneumonia    Pneumonia    Sinus disorder    Sleep apnea    CPAP   Sleep apnea with use of continuous positive airway pressure (CPAP)    Snores    Wears glasses     Tobacco History: Social History   Tobacco Use  Smoking Status Never Smoker  Smokeless Tobacco Never Used   Counseling given: Yes  Continue to not smoke  Outpatient Encounter Medications as of 06/30/2019  Medication Sig   Biotin 5 MG TABS Take by mouth.   buPROPion (WELLBUTRIN XL) 300 MG 24 hr tablet Take 300 mg by mouth daily.   cetirizine (ZYRTEC) 10 MG chewable tablet Chew 10 mg by mouth daily.   ferrous sulfate 325 (65 FE) MG tablet Take 325 mg by mouth daily with breakfast.   FLUoxetine (PROZAC) 40 MG capsule Take 40 mg by mouth.   fluticasone (FLONASE) 50 MCG/ACT nasal spray Place 2 sprays into both nostrils daily.   Multiple Vitamins-Minerals (MULTIVITAMIN WITH MINERALS) tablet Take 1 tablet by mouth daily.  Omega-3 Fatty Acids (FISH OIL) 1000 MG CAPS Take 1 capsule by mouth daily.    [DISCONTINUED] azithromycin (ZITHROMAX) 500 MG tablet Take 1 tablet (500mg ) daily for 5 days   [DISCONTINUED] predniSONE (DELTASONE) 10 MG tablet 4 tabs for 2 days, then 3 tabs for 2 days, 2 tabs for 2 days, then 1 tab for 2 days, then stop   HYDROcodone-homatropine (HYCODAN) 5-1.5 MG/5ML syrup Take 5 mLs by mouth every 6 (six) hours as needed for cough. (Patient not taking: Reported on 06/30/2019)   Vitamin D, Ergocalciferol, (DRISDOL) 50000 units CAPS capsule Take 1 capsule (50,000 Units total) by mouth every 7 (seven) days. (Patient not taking: Reported on 06/30/2019)   No facility-administered encounter medications on file as of 06/30/2019.       Review of Systems  Review of Systems  Constitutional: Negative for activity change, fatigue and fever.  HENT: Positive for congestion, postnasal drip and rhinorrhea. Negative for sinus pressure, sinus pain and sore throat.   Respiratory: Negative for cough, shortness of breath and wheezing.   Cardiovascular: Negative for chest pain and palpitations.  Gastrointestinal: Negative for diarrhea, nausea and vomiting.  Musculoskeletal: Negative for arthralgias.  Neurological: Negative for dizziness.  Psychiatric/Behavioral: Positive for sleep disturbance (Sleeping poorly, not using CPAP). The patient is not nervous/anxious.      Physical Exam  BP 104/64 (BP Location: Right Arm, Patient Position: Sitting, Cuff Size: Normal)    Pulse 71    Temp 98.4 F (36.9 C)    Ht 5\' 6"  (1.676 m)    Wt 208 lb 3.2 oz (94.4 kg)    LMP 01/11/2013    SpO2 97%    BMI 33.60 kg/m   Wt Readings from Last 5 Encounters:  06/30/19 208 lb 3.2 oz (94.4 kg)  11/02/18 209 lb 9.6 oz (95.1 kg)  10/29/18 209 lb 6.4 oz (95 kg)  10/06/18 194 lb (88 kg)  04/21/18 199 lb 12.8 oz (90.6 kg)    Physical Exam Vitals signs and nursing note reviewed.  Constitutional:      General: She is not in acute distress.    Appearance: Normal appearance.  HENT:     Head: Normocephalic and atraumatic.     Right Ear: Hearing, tympanic membrane, ear canal and external ear normal. There is no impacted cerumen.     Left Ear: Hearing, ear canal and external ear normal. There is no impacted cerumen.     Ears:     Comments: TM with fluid behind ear, clear, bilaterally    Nose: Congestion and rhinorrhea present.     Mouth/Throat:     Mouth: Mucous membranes are dry.     Pharynx: Oropharynx is clear.     Comments: Postnasal drip Eyes:     Pupils: Pupils are equal, round, and reactive to light.  Neck:     Musculoskeletal: Normal range of motion.  Cardiovascular:     Rate and Rhythm: Normal rate and regular rhythm.     Pulses:  Normal pulses.     Heart sounds: Normal heart sounds. No murmur.  Pulmonary:     Effort: Pulmonary effort is normal. No respiratory distress.     Breath sounds: Normal breath sounds. No decreased air movement. No decreased breath sounds, wheezing or rales.  Abdominal:     General: Abdomen is flat. Bowel sounds are normal.     Palpations: Abdomen is soft.  Skin:    General: Skin is warm and dry.     Capillary Refill: Capillary  refill takes less than 2 seconds.  Neurological:     General: No focal deficit present.     Mental Status: She is alert and oriented to person, place, and time. Mental status is at baseline.     Gait: Gait normal.  Psychiatric:        Mood and Affect: Mood normal.        Behavior: Behavior normal.        Thought Content: Thought content normal.        Judgment: Judgment normal.      Lab Results:  CBC    Component Value Date/Time   WBC 5.3 12/28/2018 1346   RBC 4.61 12/28/2018 1346   HGB 13.3 12/28/2018 1346   HGB 12.1 03/12/2018 1255   HGB 13.5 11/27/2016 1402   HCT 40.4 12/28/2018 1346   HCT 36.8 03/12/2018 1255   HCT 39.5 11/27/2016 1402   PLT 397 12/28/2018 1346   PLT 417 (H) 03/12/2018 1255   MCV 87.6 12/28/2018 1346   MCV 87 03/12/2018 1255   MCV 86.8 11/27/2016 1402   MCH 28.9 12/28/2018 1346   MCHC 32.9 12/28/2018 1346   RDW 13.0 12/28/2018 1346   RDW 14.4 03/12/2018 1255   RDW 14.4 11/27/2016 1402   LYMPHSABS 2.8 03/12/2018 1255   LYMPHSABS 3.0 11/27/2016 1402   MONOABS 0.5 11/27/2016 1402   EOSABS 0.3 03/12/2018 1255   BASOSABS 0.0 03/12/2018 1255   BASOSABS 0.1 11/27/2016 1402    BMET    Component Value Date/Time   NA 139 12/28/2018 1346   NA 142 03/12/2018 1255   NA 145 11/27/2016 1402   K 3.5 12/28/2018 1346   K 3.9 11/27/2016 1402   CL 103 12/28/2018 1346   CO2 27 12/28/2018 1346   CO2 30 (H) 11/27/2016 1402   GLUCOSE 113 (H) 12/28/2018 1346   GLUCOSE 128 11/27/2016 1402   BUN 11 12/28/2018 1346   BUN 15  03/12/2018 1255   BUN 13.7 11/27/2016 1402   CREATININE 0.82 12/28/2018 1346   CREATININE 0.8 11/27/2016 1402   CALCIUM 10.3 12/28/2018 1346   CALCIUM 10.7 (H) 11/27/2016 1402   GFRNONAA >60 12/28/2018 1346   GFRAA >60 12/28/2018 1346    BNP No results found for: BNP  ProBNP No results found for: PROBNP    Assessment & Plan:   OSA (obstructive sleep apnea) Plan: Resume CPAP use Follow-up with our office in 4 to 6 weeks  Allergic rhinitis Plan: Resume Zyrtec use Continue Flonase use Can start nasal saline rinses twice daily  Multinodular goiter Plan: Continue follow-up with primary care  Solitary lung nodule Discussion: Slowly growing pulmonary nodule.  Previously PET -2019.  Discussed with patient today that we may need to consider doing a bronchoscopy for tissue sample.  I will review this with Dr. Elsworth Soho and follow back up with the patient  Plan: We will consider outpatient bronchoscopy to further evaluate right lower lobe pulmonary nodule  Abnormal findings on diagnostic imaging of lung Plan: We will consider outpatient bronchoscopy to further evaluate right lower lobe nodule  Healthcare maintenance Plan: Recommended flu vaccine today, patient declined    Return in about 4 weeks (around 07/28/2019), or if symptoms worsen or fail to improve, for Follow up with Dr. Elsworth Soho.   Lauraine Rinne, NP 06/30/2019   This appointment was 28 minutes long with over 50% of the time in direct face-to-face patient care, assessment, plan of care, and follow-up.

## 2019-06-30 ENCOUNTER — Other Ambulatory Visit: Payer: Self-pay

## 2019-06-30 ENCOUNTER — Ambulatory Visit: Payer: Medicare Other | Admitting: Pulmonary Disease

## 2019-06-30 ENCOUNTER — Encounter: Payer: Self-pay | Admitting: Pulmonary Disease

## 2019-06-30 VITALS — BP 104/64 | HR 71 | Temp 98.4°F | Ht 66.0 in | Wt 208.2 lb

## 2019-06-30 DIAGNOSIS — Z0000 Encounter for general adult medical examination without abnormal findings: Secondary | ICD-10-CM | POA: Insufficient documentation

## 2019-06-30 DIAGNOSIS — E042 Nontoxic multinodular goiter: Secondary | ICD-10-CM

## 2019-06-30 DIAGNOSIS — J301 Allergic rhinitis due to pollen: Secondary | ICD-10-CM

## 2019-06-30 DIAGNOSIS — R911 Solitary pulmonary nodule: Secondary | ICD-10-CM

## 2019-06-30 DIAGNOSIS — R918 Other nonspecific abnormal finding of lung field: Secondary | ICD-10-CM

## 2019-06-30 DIAGNOSIS — G4733 Obstructive sleep apnea (adult) (pediatric): Secondary | ICD-10-CM

## 2019-06-30 NOTE — Assessment & Plan Note (Signed)
Discussion: Slowly growing pulmonary nodule.  Previously PET -2019.  Discussed with patient today that we may need to consider doing a bronchoscopy for tissue sample.  I will review this with Dr. Elsworth Soho and follow back up with the patient  Plan: We will consider outpatient bronchoscopy to further evaluate right lower lobe pulmonary nodule

## 2019-06-30 NOTE — Assessment & Plan Note (Signed)
Plan: Resume Zyrtec use Continue Flonase use Can start nasal saline rinses twice daily

## 2019-06-30 NOTE — Assessment & Plan Note (Signed)
Plan: We will consider outpatient bronchoscopy to further evaluate right lower lobe nodule

## 2019-06-30 NOTE — Assessment & Plan Note (Signed)
Plan: Continue follow-up with primary care 

## 2019-06-30 NOTE — Assessment & Plan Note (Signed)
Plan: Resume CPAP use Follow-up with our office in 4 to 6 weeks

## 2019-06-30 NOTE — Patient Instructions (Addendum)
You were seen today by Lauraine Rinne, NP  for:   1. Solitary lung nodule  Slowly enlarging right lower lobe nodule.  I will discuss with Dr. Elsworth Soho how best to proceed forward.  My recommendation today would be that we perform a bronchoscopy to obtain a tissue sample of this nodule.  2. Abnormal findings on diagnostic imaging of lung  I will discuss your case with Dr. Elsworth Soho.  3. OSA (obstructive sleep apnea)  We recommend that you resume using your CPAP daily >>>Keep up the hard work using your device >>> Goal should be wearing this for the entire night that you are sleeping, at least 4 to 6 hours  Remember:  . Do not drive or operate heavy machinery if tired or drowsy.  . Please notify the supply company and office if you are unable to use your device regularly due to missing supplies or machine being broken.  . Work on maintaining a healthy weight and following your recommended nutrition plan  . Maintain proper daily exercise and movement  . Maintaining proper use of your device can also help improve management of other chronic illnesses such as: Blood pressure, blood sugars, and weight management.   BiPAP/ CPAP Cleaning:  >>>Clean weekly, with Dawn soap, and bottle brush.  Set up to air dry.   4. Multinodular goiter  Continue to follow-up with primary care for management and following your thyroid nodules  5. Healthcare maintenance  We recommended flu vaccine today, you declined   Follow Up:    Return in about 4 weeks (around 07/28/2019), or if symptoms worsen or fail to improve, for Follow up with Dr. Elsworth Soho.   Please do your part to reduce the spread of COVID-19:      Reduce your risk of any infection  and COVID19 by using the similar precautions used for avoiding the common cold or flu:  Marland Kitchen Wash your hands often with soap and warm water for at least 20 seconds.  If soap and water are not readily available, use an alcohol-based hand sanitizer with at least 60% alcohol.   . If coughing or sneezing, cover your mouth and nose by coughing or sneezing into the elbow areas of your shirt or coat, into a tissue or into your sleeve (not your hands). Langley Gauss A MASK when in public  . Avoid shaking hands with others and consider head nods or verbal greetings only. . Avoid touching your eyes, nose, or mouth with unwashed hands.  . Avoid close contact with people who are sick. . Avoid places or events with large numbers of people in one location, like concerts or sporting events. . If you have some symptoms but not all symptoms, continue to monitor at home and seek medical attention if your symptoms worsen. . If you are having a medical emergency, call 911.   [place] / e-Visit: [email or web address]         [facility]: [facility]: [id number]                   [facility]: [id number]     It is flu season:   >>> Best ways to protect herself from the flu: Receive the yearly flu vaccine, practice good hand hygiene washing with soap and also using hand sanitizer when available, eat a nutritious meals, get adequate rest, hydrate appropriately   Please contact the office if your symptoms  worsen or you have concerns that you are not improving.   Thank you for choosing [facility] for your healthcare, and for allowing [place] to partner with you on your healthcare journey. I am thankful to be able to provide care to you today.   Wyn Quaker FNP-C

## 2019-06-30 NOTE — Assessment & Plan Note (Signed)
Plan: Recommended flu vaccine today, patient declined

## 2019-07-01 ENCOUNTER — Telehealth: Payer: Self-pay | Admitting: Pulmonary Disease

## 2019-07-01 DIAGNOSIS — R911 Solitary pulmonary nodule: Secondary | ICD-10-CM

## 2019-07-01 DIAGNOSIS — R918 Other nonspecific abnormal finding of lung field: Secondary | ICD-10-CM

## 2019-07-01 NOTE — Telephone Encounter (Signed)
Called and spoke to pt. Informed pt we spoke earlier and Brian's call was regarding the same thing, nothing new. Pt verbalized understanding. She is aware we will contact her once it gets closer to December for PET and appt.   Will keep message open to follow up on for RA appt.

## 2019-07-01 NOTE — Telephone Encounter (Signed)
Updated and corrected PET CT ordered based off of [facility]'s request.  Aaron Edelman

## 2019-07-01 NOTE — Telephone Encounter (Signed)
Called and spoke to pt. Informed he of the recs per Aaron Edelman. Pt verbalized understanding. Recalls have been cancelled and pt is aware that her f/u with RA will need to come shortly after the PET scan to review the results. RA does not have any openings at this time. Will place recall for the the December appt with RA and also leave message open to follow up on RA's schedule.   Will forward to [place] to follow up on.

## 2019-07-01 NOTE — Telephone Encounter (Signed)
-----   Message from Rigoberto Noel, MD sent at 06/30/2019  5:15 PM EDT ----- Regarding: RE: CT - RLL Nodule enlarging - Bronch? Does not need bronchoscopy at this time since PET negative in the past - could be hamartoma Can you please arrange PET scan in 3 months & appt with me please?  RA ----- Message ----- From: Lauraine Rinne, NP Sent: 06/30/2019  11:14 AM EDT To: Rigoberto Noel, MD Subject: CT - RLL Nodule enlarging - Bronch?            Dr. Ranelle Oyster this patient in office today.  06/28/2019-CT chest with contrast- continued slow interval growth of right lower lobe pulmonary nodule, findings may represent slowly growing benign pulmonary nodule versus low-grade indolent pulmonary neoplasm, stable right upper lobe pulmonary nodule within the posterior right upper lobe, right lower lobe pulmonary nodular nodule measuring 1.6 x 1.3 x 1.3 cm on previous exam this measured 1.5 x 0.9 x 1.2 cm.  You probably remember and are familiar with the patient that her right lower lobe nodule has slowly been increasing.  2019 PET scan was PET negative.  I think at this point we likely need to consider getting patient set up for bronchoscopy for tissue sampling.  She is agreeable to this if you also agree.  She is not on any blood thinners or diabetic meds.  Let me know if I can help you with this at all.  Or if you need me contacting the patient based off of your thoughts.Aaron Edelman

## 2019-07-01 NOTE — Telephone Encounter (Signed)
Pt calling stating that she is returning call from Wyn Quaker she can be reached @ [phone].[name]

## 2019-07-01 NOTE — Telephone Encounter (Signed)
07/01/2019 0913  Attempted to reach the patient to follow-up from her office visit yesterday. Left VM for call back.   I discussed the case with Dr. Elsworth Soho.  He believes the patient does not need to proceed forward with bronchoscopy.  Still believes that this could be favoring a hamartoma.  Patient was PET negative in 2019.  Slowly growing right lower lobe nodule.   I have ordered a PET scan to be completed in 3 months.  Goal should be to have this PET scan completed around first week of December/2020.  Patient then needs to be scheduled for a follow-up with Dr. Elsworth Soho after completing that PET scan.  Okay to go ahead and cancel recall that was scheduled 4 weeks from now.  Please place recall to follow-up with Dr. Elsworth Soho in December/2020 after completing PET scan.  Can discuss further interventions after that.  Wyn Quaker FNP

## 2019-08-09 ENCOUNTER — Other Ambulatory Visit: Payer: Self-pay | Admitting: Family

## 2019-08-09 DIAGNOSIS — Z1231 Encounter for screening mammogram for malignant neoplasm of breast: Secondary | ICD-10-CM

## 2019-08-24 ENCOUNTER — Telehealth: Payer: Self-pay | Admitting: Family

## 2019-08-24 NOTE — Telephone Encounter (Signed)
Aware.  Can purchase over the counter  Vitamin D 1000 or 2000 u to take until appointment for recheck and lab work.

## 2019-08-24 NOTE — Telephone Encounter (Signed)
Pt's Vit D has not been checked since 02/2018, informed her that this would have to be filled when her level was rechecked at her visit on 09/06/19. Pt would like advise of Grace Dun, FNP since she is a vegan and recommendations she has heard about COVID-19. I also recommended to patient that she could take OTC 5000 units daily. Please advise on Rx for Vit D

## 2019-08-25 DIAGNOSIS — M25562 Pain in left knee: Secondary | ICD-10-CM | POA: Insufficient documentation

## 2019-09-03 ENCOUNTER — Telehealth: Payer: Self-pay | Admitting: Family

## 2019-09-03 ENCOUNTER — Other Ambulatory Visit: Payer: Self-pay

## 2019-09-06 ENCOUNTER — Other Ambulatory Visit: Payer: Self-pay

## 2019-09-06 ENCOUNTER — Encounter: Payer: Self-pay | Admitting: Family

## 2019-09-06 ENCOUNTER — Ambulatory Visit (INDEPENDENT_AMBULATORY_CARE_PROVIDER_SITE_OTHER): Payer: Medicare Other | Admitting: Family

## 2019-09-06 VITALS — BP 110/72 | HR 78 | Temp 97.8°F | Ht 66.0 in | Wt 211.0 lb

## 2019-09-06 DIAGNOSIS — F419 Anxiety disorder, unspecified: Secondary | ICD-10-CM

## 2019-09-06 DIAGNOSIS — E785 Hyperlipidemia, unspecified: Secondary | ICD-10-CM

## 2019-09-06 DIAGNOSIS — Z0000 Encounter for general adult medical examination without abnormal findings: Secondary | ICD-10-CM | POA: Diagnosis not present

## 2019-09-06 DIAGNOSIS — E559 Vitamin D deficiency, unspecified: Secondary | ICD-10-CM | POA: Diagnosis not present

## 2019-09-06 DIAGNOSIS — E669 Obesity, unspecified: Secondary | ICD-10-CM

## 2019-09-06 DIAGNOSIS — F411 Generalized anxiety disorder: Secondary | ICD-10-CM | POA: Diagnosis not present

## 2019-09-06 DIAGNOSIS — Z0001 Encounter for general adult medical examination with abnormal findings: Secondary | ICD-10-CM | POA: Diagnosis not present

## 2019-09-06 DIAGNOSIS — F339 Major depressive disorder, recurrent, unspecified: Secondary | ICD-10-CM

## 2019-09-06 DIAGNOSIS — R7309 Other abnormal glucose: Secondary | ICD-10-CM | POA: Diagnosis not present

## 2019-09-06 DIAGNOSIS — F251 Schizoaffective disorder, depressive type: Secondary | ICD-10-CM

## 2019-09-06 LAB — URINALYSIS, COMPLETE
Bilirubin, UA: NEGATIVE
Glucose, UA: NEGATIVE
Ketones, UA: NEGATIVE
Nitrite, UA: NEGATIVE
Specific Gravity, UA: >1.03 — ABNORMAL HIGH (ref 1.005–1.030)
Urobilinogen, Ur: 0.2 mg/dL (ref 0.2–1.0)
pH, UA: 5.5 (ref 5.0–7.5)

## 2019-09-06 LAB — MICROSCOPIC EXAMINATION

## 2019-09-06 NOTE — Progress Notes (Signed)
Subjective:    Patient ID: Grace Barajas, female    DOB: 22-Jul-1966, 53 y.o.   MRN: [id number]  Chief Complaint  Patient presents with  . Gynecologic Exam    Pt presents to the office today for CPE without pap. PT is followed by [facility] every month for schizo-affective Disorder, GAD, and depression. PT is followed by Pulmonologist for lung nodule and CPAP. She had CT scan in 06/28/19 and is scheduled for December to monitor lung nodule. She currently has a slow interval growth of right lobe nodule.  Anxiety Presents for follow-up visit. Symptoms include depressed mood, excessive worry, nervous/anxious behavior and restlessness. Symptoms occur most days. The severity of symptoms is moderate.    Depression        This is a chronic problem.  The current episode started more than 1 year ago.   The onset quality is gradual.   The problem occurs intermittently.  Associated symptoms include irritable and restlessness.  Associated symptoms include no helplessness and no hopelessness.  Past medical history includes anxiety.   Hyperlipidemia This is a chronic problem. The current episode started more than 1 year ago. The problem is uncontrolled. Recent lipid tests were reviewed and are high. Exacerbating diseases include obesity. Current antihyperlipidemic treatment includes statins. The current treatment provides moderate improvement of lipids. Risk factors for coronary artery disease include dyslipidemia, hypertension, a sedentary lifestyle and post-menopausal.      Review of Systems  Psychiatric/Behavioral: Positive for depression. The patient is nervous/anxious.   All other systems reviewed and are negative.  Family History  Problem Relation Age of Onset  . Asthma Son   . Asthma Brother   . Obesity Father   . Diabetes Father   . Heart disease Father   . Hyperlipidemia Father   . Diabetes Maternal Grandmother   . Diabetes Maternal Aunt   . Diabetes Maternal Uncle   .  Colon polyps Maternal Uncle   . Thyroid disease Neg Hx   . Breast cancer Neg Hx    Social History   Socioeconomic History  . Marital status: Single    Spouse name: Not on file  . Number of children: 3  . Years of education: Not on file  . Highest education level: Not on file  Occupational History  . Occupation: disabled  . Occupation: Ship broker  Social Needs  . Financial resource strain: Not hard at all  . Food insecurity    Worry: Never true    Inability: Never true  . Transportation needs    Medical: No    Non-medical: No  Tobacco Use  . Smoking status: Never Smoker  . Smokeless tobacco: Never Used  Substance and Sexual Activity  . Alcohol use: Yes    Comment: social  . Drug use: No  . Sexual activity: Not on file  Lifestyle  . Physical activity    Days per week: 0 days    Minutes per session: 0 min  . Stress: Not on file  Relationships  . Social connections    Talks on phone: More than three times a week    Gets together: Once a week    Attends religious service: More than 4 times per year    Active member of club or organization: No    Attends meetings of clubs or organizations: Never    Relationship status: Never married  Other Topics Concern  . Not on file  Social History Narrative  . Not on file  Objective:   Physical Exam Vitals signs reviewed.  Constitutional:      General: She is irritable. She is not in acute distress.    Appearance: She is well-developed.  HENT:     Head: Normocephalic and atraumatic.     Right Ear: Tympanic membrane normal.     Left Ear: Tympanic membrane normal.  Eyes:     Pupils: Pupils are equal, round, and reactive to light.  Neck:     Musculoskeletal: Normal range of motion and neck supple.     Thyroid: No thyromegaly.  Cardiovascular:     Rate and Rhythm: Normal rate and regular rhythm.     Heart sounds: Normal heart sounds. No murmur.  Pulmonary:     Effort: Pulmonary effort is normal. No respiratory  distress.     Breath sounds: Normal breath sounds. No wheezing.  Chest:     Breasts:        Right: No swelling, bleeding, inverted nipple, mass, nipple discharge, skin change or tenderness.        Left: No swelling, bleeding, inverted nipple, mass, nipple discharge, skin change or tenderness.  Abdominal:     General: Bowel sounds are normal. There is no distension.     Palpations: Abdomen is soft.     Tenderness: There is no abdominal tenderness.  Musculoskeletal: Normal range of motion.        General: No tenderness.  Skin:    General: Skin is warm and dry.  Neurological:     Mental Status: She is alert and oriented to person, place, and time.     Cranial Nerves: No cranial nerve deficit.     Deep Tendon Reflexes: Reflexes are normal and symmetric.  Psychiatric:        Behavior: Behavior normal.        Thought Content: Thought content normal.        Judgment: Judgment normal.      BP 110/72   Pulse 78   Temp 97.8 F (36.6 C) (Oral)   Ht '5\' 6"'  (1.676 m)   Wt 211 lb (95.7 kg)   LMP 01/11/2013   SpO2 100%   BMI 34.06 kg/m       Assessment & Plan:  Grace Barajas comes in today with chief complaint of Gynecologic Exam   Diagnosis and orders addressed:  1. Annual physical exam - CMP14+EGFR - CBC with Differential/Platelet - Lipid panel - TSH  2. Episode of recurrent major depressive disorder, unspecified depression episode severity (Grace Barajas) - CMP14+EGFR - CBC with Differential/Platelet  3. GAD (generalized anxiety disorder) - CMP14+EGFR - CBC with Differential/Platelet  4. Obesity (BMI 30.0-34.9) - CMP14+EGFR - CBC with Differential/Platelet  5. Anxiety - CMP14+EGFR - CBC with Differential/Platelet  6. Schizoaffective disorder, depressive type (Grace Barajas) - CMP14+EGFR - CBC with Differential/Platelet  7. Vitamin D deficiency - CMP14+EGFR - CBC with Differential/Platelet - Vitamin D 25 hydroxy  8. Hyperlipidemia, unspecified hyperlipidemia type -  CMP14+EGFR - CBC with Differential/Platelet - Lipid panel   Labs pending Health Maintenance reviewed Diet and exercise encouraged  Follow up plan: 6 months    Evelina Dun, FNP

## 2019-09-06 NOTE — Patient Instructions (Signed)
Health Maintenance, Female Adopting a healthy lifestyle and getting preventive care are important in promoting health and wellness. Ask your health care provider about:  The right schedule for you to have regular tests and exams.  Things you can do on your own to prevent diseases and keep yourself healthy. What should I know about diet, weight, and exercise? Eat a healthy diet   Eat a diet that includes plenty of vegetables, fruits, low-fat dairy products, and lean protein.  Do not eat a lot of foods that are high in solid fats, added sugars, or sodium. Maintain a healthy weight Body mass index (BMI) is used to identify weight problems. It estimates body fat based on height and weight. Your health care provider can help determine your BMI and help you achieve or maintain a healthy weight. Get regular exercise Get regular exercise. This is one of the most important things you can do for your health. Most adults should:  Exercise for at least 150 minutes each week. The exercise should increase your heart rate and make you sweat (moderate-intensity exercise).  Do strengthening exercises at least twice a week. This is in addition to the moderate-intensity exercise.  Spend less time sitting. Even light physical activity can be beneficial. Watch cholesterol and blood lipids Have your blood tested for lipids and cholesterol at 54 years of age, then have this test every 5 years. Have your cholesterol levels checked more often if:  Your lipid or cholesterol levels are high.  You are older than 54 years of age.  You are at high risk for heart disease. What should I know about cancer screening? Depending on your health history and family history, you may need to have cancer screening at various ages. This may include screening for:  Breast cancer.  Cervical cancer.  Colorectal cancer.  Skin cancer.  Lung cancer. What should I know about heart disease, diabetes, and high blood  pressure? Blood pressure and heart disease  High blood pressure causes heart disease and increases the risk of stroke. This is more likely to develop in people who have high blood pressure readings, are of African descent, or are overweight.  Have your blood pressure checked: ? Every 3-5 years if you are 18-39 years of age. ? Every year if you are 40 years old or older. Diabetes Have regular diabetes screenings. This checks your fasting blood sugar level. Have the screening done:  Once every three years after age 40 if you are at a normal weight and have a low risk for diabetes.  More often and at a younger age if you are overweight or have a high risk for diabetes. What should I know about preventing infection? Hepatitis B If you have a higher risk for hepatitis B, you should be screened for this virus. Talk with your health care provider to find out if you are at risk for hepatitis B infection. Hepatitis C Testing is recommended for:  Everyone born from 1945 through 1965.  Anyone with known risk factors for hepatitis C. Sexually transmitted infections (STIs)  Get screened for STIs, including gonorrhea and chlamydia, if: ? You are sexually active and are younger than 54 years of age. ? You are older than 54 years of age and your health care provider tells you that you are at risk for this type of infection. ? Your sexual activity has changed since you were last screened, and you are at increased risk for chlamydia or gonorrhea. Ask your health care provider if   you are at risk.  Ask your health care provider about whether you are at high risk for HIV. Your health care provider may recommend a prescription medicine to help prevent HIV infection. If you choose to take medicine to prevent HIV, you should first get tested for HIV. You should then be tested every 3 months for as long as you are taking the medicine. Pregnancy  If you are about to stop having your period (premenopausal) and  you may become pregnant, seek counseling before you get pregnant.  Take 400 to 800 micrograms (mcg) of folic acid every day if you become pregnant.  Ask for birth control (contraception) if you want to prevent pregnancy. Osteoporosis and menopause Osteoporosis is a disease in which the bones lose minerals and strength with aging. This can result in bone fractures. If you are 65 years old or older, or if you are at risk for osteoporosis and fractures, ask your health care provider if you should:  Be screened for bone loss.  Take a calcium or vitamin D supplement to lower your risk of fractures.  Be given hormone replacement therapy (HRT) to treat symptoms of menopause. Follow these instructions at home: Lifestyle  Do not use any products that contain nicotine or tobacco, such as cigarettes, e-cigarettes, and chewing tobacco. If you need help quitting, ask your health care provider.  Do not use street drugs.  Do not share needles.  Ask your health care provider for help if you need support or information about quitting drugs. Alcohol use  Do not drink alcohol if: ? Your health care provider tells you not to drink. ? You are pregnant, may be pregnant, or are planning to become pregnant.  If you drink alcohol: ? Limit how much you use to 0-1 drink a day. ? Limit intake if you are breastfeeding.  Be aware of how much alcohol is in your drink. In the [place] one drink equals one 12 oz bottle of beer (355 mL), one 5 oz glass of wine (148 mL), or one 1 oz glass of hard liquor (44 mL). General instructions  Schedule regular health, dental, and eye exams.  Stay current with your vaccines.  Tell your health care provider if: ? You often feel depressed. ? You have ever been abused or do not feel safe at home. Summary  Adopting a healthy lifestyle and getting preventive care are important in promoting health and wellness.  Follow your health care provider's instructions about healthy  diet, exercising, and getting tested or screened for diseases.  Follow your health care provider's instructions on monitoring your cholesterol and blood pressure. This information is not intended to replace advice given to you by your health care provider. Make sure you discuss any questions you have with your health care provider. Document Released: 04/29/2011 Document Revised: 10/07/2018 Document Reviewed: 10/07/2018 Elsevier Patient Education  2020 [employer]  

## 2019-09-06 NOTE — Addendum Note (Signed)
Addended by: Shelbie Ammons on: 09/06/2019 10:18 AM   Modules accepted: Orders

## 2019-09-07 DIAGNOSIS — M25562 Pain in left knee: Secondary | ICD-10-CM | POA: Diagnosis not present

## 2019-09-07 LAB — LIPID PANEL
Chol/HDL Ratio: 4.1 ratio (ref 0.0–4.4)
Cholesterol, Total: 177 mg/dL (ref 100–199)
HDL: 43 mg/dL (ref >39)
LDL Chol Calc (NIH): 106 mg/dL — ABNORMAL HIGH (ref 0–99)
Triglycerides: 162 mg/dL — ABNORMAL HIGH (ref 0–149)
VLDL Cholesterol Cal: 28 mg/dL (ref 5–40)

## 2019-09-07 LAB — CBC WITH DIFFERENTIAL/PLATELET
Basophils Absolute: 0 10*3/uL (ref 0.0–0.2)
Basos: 0 %
EOS (ABSOLUTE): 0.2 10*3/uL (ref 0.0–0.4)
Eos: 3 %
Hematocrit: 35.3 % (ref 34.0–46.6)
Hemoglobin: 12.2 g/dL (ref 11.1–15.9)
Immature Grans (Abs): 0 10*3/uL (ref 0.0–0.1)
Immature Granulocytes: 0 %
Lymphocytes Absolute: 2.5 10*3/uL (ref 0.7–3.1)
Lymphs: 49 %
MCH: 29.6 pg (ref 26.6–33.0)
MCHC: 34.6 g/dL (ref 31.5–35.7)
MCV: 86 fL (ref 79–97)
Monocytes Absolute: 0.4 10*3/uL (ref 0.1–0.9)
Monocytes: 7 %
Neutrophils Absolute: 2.1 10*3/uL (ref 1.4–7.0)
Neutrophils: 41 %
Platelets: 392 10*3/uL (ref 150–450)
RBC: 4.12 x10E6/uL (ref 3.77–5.28)
RDW: 13.2 % (ref 11.7–15.4)
WBC: 5.1 10*3/uL (ref 3.4–10.8)

## 2019-09-07 LAB — CMP14+EGFR
ALT: 15 IU/L (ref 0–32)
AST: 12 IU/L (ref 0–40)
Albumin/Globulin Ratio: 1.7 (ref 1.2–2.2)
Albumin: 4.2 g/dL (ref 3.8–4.9)
Alkaline Phosphatase: 84 IU/L (ref 39–117)
BUN/Creatinine Ratio: 19 (ref 9–23)
BUN: 13 mg/dL (ref 6–24)
Bilirubin Total: 0.2 mg/dL (ref 0.0–1.2)
CO2: 25 mmol/L (ref 20–29)
Calcium: 9.6 mg/dL (ref 8.7–10.2)
Chloride: 104 mmol/L (ref 96–106)
Creatinine, Ser: 0.7 mg/dL (ref 0.57–1.00)
GFR calc Af Amer: 114 mL/min/{1.73_m2} (ref >59)
GFR calc non Af Amer: 99 mL/min/{1.73_m2} (ref >59)
Globulin, Total: 2.5 g/dL (ref 1.5–4.5)
Glucose: 116 mg/dL — ABNORMAL HIGH (ref 65–99)
Potassium: 4 mmol/L (ref 3.5–5.2)
Sodium: 141 mmol/L (ref 134–144)
Total Protein: 6.7 g/dL (ref 6.0–8.5)

## 2019-09-07 LAB — URINE CULTURE

## 2019-09-07 LAB — VITAMIN D 25 HYDROXY (VIT D DEFICIENCY, FRACTURES): Vit D, 25-Hydroxy: 30.7 ng/mL (ref 30.0–100.0)

## 2019-09-07 LAB — TSH: TSH: 2.34 u[IU]/mL (ref 0.450–4.500)

## 2019-09-10 LAB — HGB A1C W/O EAG: Hgb A1c MFr Bld: 6 % — ABNORMAL HIGH (ref 4.8–5.6)

## 2019-09-10 LAB — SPECIMEN STATUS REPORT

## 2019-09-13 ENCOUNTER — Other Ambulatory Visit: Payer: Self-pay | Admitting: Family

## 2019-09-13 DIAGNOSIS — E118 Type 2 diabetes mellitus with unspecified complications: Secondary | ICD-10-CM

## 2019-09-13 DIAGNOSIS — M25562 Pain in left knee: Secondary | ICD-10-CM | POA: Diagnosis not present

## 2019-09-13 MED ORDER — BLOOD GLUCOSE METER KIT: PACK | 0 refills | Status: DC

## 2019-09-20 ENCOUNTER — Ambulatory Visit (INDEPENDENT_AMBULATORY_CARE_PROVIDER_SITE_OTHER): Payer: Medicare Other | Admitting: *Deleted

## 2019-09-20 DIAGNOSIS — Z0000 Encounter for general adult medical examination without abnormal findings: Secondary | ICD-10-CM | POA: Diagnosis not present

## 2019-09-20 NOTE — Progress Notes (Signed)
MEDICARE ANNUAL WELLNESS VISIT  09/20/2019  Telephone Visit Disclaimer This Medicare AWV was conducted by telephone due to national recommendations for restrictions regarding the COVID-19 Pandemic (e.g. social distancing).  I verified, using two identifiers, that I am speaking with Grace Barajas or their authorized healthcare agent. I discussed the limitations, risks, security, and privacy concerns of performing an evaluation and management service by telephone and the potential availability of an in-person appointment in the future. The patient expressed understanding and agreed to proceed.   Subjective:  Grace Barajas is a 53 y.o. female patient of Hawks, Grace Hawthorne, FNP who had a Medicare Annual Wellness Visit today via telephone. Grace Barajas is Disabled and lives with her 14 year old daughter. She has 3 children. She reports that she is socially active and does interact with friends/family regularly.She is minimally physically active and enjoys talking to family and surfing the internet. She is currently in school working on get Masters degree in [employer].   Patient Care Team: Grace Balloon, FNP as PCP - General (Nurse Practitioner) Grace Noel, MD as Consulting Physician (Pulmonary Disease) Grace Halsted, MD as Referring Physician (Ophthalmology) Grace Barajas, Grace Nose, MD (Otolaryngology) Grace Pert, MD as Referring Physician (Psychiatry)  Advanced Directives 09/20/2019 02/11/2018 11/27/2016 11/10/2016 09/26/2016 09/04/2015 01/10/2014  Does Patient Have a Medical Advance Directive? Yes No Yes Yes No Yes Patient has advance directive, copy not in chart  Type of Advance Directive Healthcare Power of Grace Barajas;Living will - - [place];Living will Living will  Does patient want to make changes to medical advance directive? - - - - - - No change requested  Copy of Grace Barajas in Chart? No -  copy requested - No - copy requested - - - -  Would patient like information on creating a medical advance directive? - No - Patient declined - - - - -  Pre-existing out of facility DNR order (yellow form or pink MOST form) - - - - - - [facility] Utilization Over the Past 12 Months: # of hospitalizations or ER visits: 0 # of surgeries: 0 Patient is scheduled for surgery on 12/09 to repair a torn meniscus.   Review of Systems    Patient reports that her overall health is worse compared to last year. Due to torn meniscus.  History obtained from chart review and the patient Torn meniscus on left leg  Patient Reported Readings (BP, Pulse, CBG, Weight, etc) none  Pain Assessment       Current Medications & Allergies (verified) Allergies as of 09/20/2019      Reactions   Morphine And Related    "Felt like an addict needing a fix."      Medication List       Accurate as of September 20, 2019  2:49 PM. If you have any questions, ask your nurse or doctor.        Biotin 5 MG Tabs Take by mouth.   blood glucose meter kit and supplies Dispense based on patient and insurance preference. Use up to four times daily as directed. (FOR ICD-10 E10.9, E11.9).   buPROPion 300 MG 24 hr tablet Commonly known as: WELLBUTRIN XL Take 300 mg by mouth daily.   cetirizine 10 MG chewable tablet Commonly known as: ZYRTEC Chew 10 mg by mouth daily.   ferrous sulfate 325 (65 FE) MG tablet Take 325 mg by mouth daily with  breakfast.   Fish Oil 1000 MG Caps Take 1 capsule by mouth daily.   FLUoxetine 40 MG capsule Commonly known as: PROZAC Take 40 mg by mouth.   FLUoxetine 20 MG capsule Commonly known as: PROZAC Take 20 mg by mouth daily.   fluticasone 50 MCG/ACT nasal spray Commonly known as: FLONASE Place 2 sprays into both nostrils daily.   perphenazine 8 MG tablet Commonly known as: TRILAFON Take by mouth.       History (reviewed): Past Medical History:  Diagnosis Date   . Allergy    SEASONAL  . Anxiety   . Depression   . GERD (gastroesophageal reflux disease)   . High cholesterol   . Pneumonia   . Pneumonia   . Sinus disorder   . Sleep apnea    CPAP  . Sleep apnea with use of continuous positive airway pressure (CPAP)   . Snores   . Wears glasses    Past Surgical History:  Procedure Laterality Date  . BILATERAL SALPINGECTOMY Bilateral 01/21/2013   Procedure: BILATERAL SALPINGECTOMY;  Surgeon: Marvene Staff, MD;  Location: [facility];  Service: Gynecology;  Laterality: Bilateral;  . CARPAL TUNNEL RELEASE Left 07/03/2007  . CARPAL TUNNEL RELEASE Right 07/31/2007  . CERVICAL FUSION     C1/C2  . DORSAL COMPARTMENT RELEASE Bilateral 01/13/2014   Procedure: BILATERAL 1ST DORSAL COMPARTMENT RELEASES;  Surgeon: Grace Sickle., MD;  Location: [facility];  Service: Orthopedics;  Laterality: Bilateral;  bilateral  . ROBOTIC ASSISTED TOTAL HYSTERECTOMY N/A 01/21/2013   Procedure: ROBOTIC ASSISTED TOTAL HYSTERECTOMY;  Surgeon: Marvene Staff, MD;  Location: [facility];  Service: Gynecology;  Laterality: N/A;  . TUBAL LIGATION     Family History  Problem Relation Age of Onset  . Asthma Son   . Asthma Brother   . Obesity Father   . Diabetes Father   . Heart disease Father   . Hyperlipidemia Father   . Diabetes Maternal Grandmother   . Diabetes Maternal Aunt   . Diabetes Maternal Uncle   . Colon polyps Maternal Uncle   . Thyroid disease Neg Hx   . Breast cancer Neg Hx    Social History   Socioeconomic History  . Marital status: Single    Spouse name: Not on file  . Number of children: 3  . Years of education: Not on file  . Highest education level: Bachelor's degree (e.g., BA, AB, BS)  Occupational History  . Occupation: disabled  . Occupation: Ship broker  Social Needs  . Financial resource strain: Not hard at all  . Food insecurity    Worry: Never true    Inability: Never true  . Transportation needs    Medical: No     Non-medical: No  Tobacco Use  . Smoking status: Never Smoker  . Smokeless tobacco: Never Used  Substance and Sexual Activity  . Alcohol use: Yes    Comment: social  . Drug use: No  . Sexual activity: Not on file  Lifestyle  . Physical activity    Days per week: 0 days    Minutes per session: 0 min  . Stress: Very much  Relationships  . Social connections    Talks on phone: More than three times a week    Gets together: Once a week    Attends religious service: More than 4 times per year    Active member of club or organization: No    Attends meetings of clubs or organizations: Never  Relationship status: Never married  Other Topics Concern  . Not on file  Social History Narrative  . Not on file    Activities of Daily Living In your present state of health, do you have any difficulty performing the following activities: 09/20/2019  Hearing? Y  Comment Out of right ear  Vision? N  Comment Wears glasses  Difficulty concentrating or making decisions? Y  Comment at times memory  Walking or climbing stairs? N  Dressing or bathing? N  Doing errands, shopping? N  Preparing Food and eating ? N  Using the Toilet? N  In the past six months, have you accidently leaked urine? N  Do you have problems with loss of bowel control? N  Managing your Medications? N  Managing your Finances? N  Housekeeping or managing your Housekeeping? N  Some recent data might be hidden    Patient Education/ Literacy    Exercise Current Exercise Habits: The patient does not participate in regular exercise at present, Exercise limited by: orthopedic condition(s)(torn meniscus)  Diet Patient reports consuming 2 meals a day and 2 snack(s) a day Patient reports that her primary diet is: Regular Patient reports that she does have regular access to food.   Depression Screen PHQ 2/9 Scores 09/20/2019 09/06/2019 03/12/2018 01/29/2018 02/27/2017 12/31/2016 11/15/2016  PHQ - 2 Score 0 0 0 0 0 0 0      Fall Risk Fall Risk  09/20/2019 09/06/2019 03/12/2018 01/29/2018 02/27/2017  Falls in the past year? 0 0 No No No  Follow up Falls prevention discussed - - - -     Objective:  Grace Barajas seemed alert and oriented and she participated appropriately during our telephone visit.  Blood Pressure Weight BMI  BP Readings from Last 3 Encounters:  09/06/19 110/72  06/30/19 104/64  12/28/18 137/76   Wt Readings from Last 3 Encounters:  09/06/19 211 lb (95.7 kg)  06/30/19 208 lb 3.2 oz (94.4 kg)  11/02/18 209 lb 9.6 oz (95.1 kg)   BMI Readings from Last 1 Encounters:  09/06/19 34.06 kg/m    *Unable to obtain current vital signs, weight, and BMI due to telephone visit type  Hearing/Vision  . Helvi did not seem to have difficulty with hearing/understanding during the telephone conversation . Reports that she has not had a formal eye exam by an eye care professional within the past year . Reports that she has had a formal hearing evaluation within the past year *Unable to fully assess hearing and vision during telephone visit type  Cognitive Function: 6CIT Screen 09/20/2019  What Year? 0 points  What month? 0 points  What time? 0 points  Count back from 20 0 points  Months in reverse 0 points  Repeat phrase 0 points  Total Score 0   (Normal:0-7, Significant for Dysfunction: >8)  Normal Cognitive Function Screening: Yes   Immunization & Health Maintenance Record Immunization History  Administered Date(s) Administered  . Influenza,inj,Quad PF,6+ Mos 08/29/2014, 01/02/2015, 09/12/2016  . Influenza-Unspecified 12/20/2015  . Tdap 09/24/2008, 10/22/2012    Health Maintenance  Topic Date Due  . FOOT EXAM  08/14/1975  . URINE MICROALBUMIN  08/14/1975  . OPHTHALMOLOGY EXAM  06/11/2015  . MAMMOGRAM  08/15/2018  . COLONOSCOPY  06/26/2019  . INFLUENZA VACCINE  03/17/2020 (Originally 05/29/2019)  . PNEUMOCOCCAL POLYSACCHARIDE VACCINE AGE 71-64 HIGH RISK  09/19/2020 (Originally  08/14/1967)  . HEMOGLOBIN A1C  03/05/2020  . PAP SMEAR-Modifier  03/12/2021  . TETANUS/TDAP  10/22/2022  . HIV Screening  Completed       Assessment  This is a routine wellness examination for [employer].  Health Maintenance: Due or Overdue Health Maintenance Due  Topic Date Due  . FOOT EXAM  08/14/1975  . URINE MICROALBUMIN  08/14/1975  . OPHTHALMOLOGY EXAM  06/11/2015  . MAMMOGRAM  08/15/2018  . COLONOSCOPY  06/26/2019    Grace Barajas does not need a referral for Community Assistance: Care Management:   no Social Work:    no Prescription Assistance:  no Nutrition/Diabetes Education:  no   Plan:  Personalized Goals Goals Addressed            This Visit's Progress   . awv       09/20/2019 AWV Goal: Exercise for General Health   Patient will verbalize understanding of the benefits of increased physical activity:  Exercising regularly is important. It will improve your overall fitness, flexibility, and endurance.  Regular exercise also will improve your overall health. It can help you control your weight, reduce stress, and improve your bone density.  Over the next year, patient will increase physical activity as tolerated with a goal of at least 150 minutes of moderate physical activity per week.   You can tell that you are exercising at a moderate intensity if your heart starts beating faster and you start breathing faster but can still hold a conversation.  Moderate-intensity exercise ideas include:  Walking 1 mile (1.6 km) in about 15 minutes  Biking  Hiking  Golfing  Dancing  Water aerobics  Patient will verbalize understanding of everyday activities that increase physical activity by providing examples like the following: ? Yard work, such as: ? Pushing a Conservation officer, nature ? Raking and bagging leaves ? Washing your car ? Pushing a stroller ? Shoveling snow ? Gardening ? Washing windows or floors  Patient will be able to explain  general safety guidelines for exercising:   Before you start a new exercise program, talk with your health care provider.  Do not exercise so much that you hurt yourself, feel dizzy, or get very short of breath.  Wear comfortable clothes and wear shoes with good support.  Drink plenty of water while you exercise to prevent dehydration or heat stroke.  Work out until your breathing and your heartbeat get faster.       Personalized Health Maintenance & Screening Recommendations  Colorectal cancer screening Yearly Diabetic Eye exam  Lung Cancer Screening Recommended: no (Low Dose CT Chest recommended if Age 60-80 years, 30 pack-year currently smoking OR have quit w/in past 15 years) Hepatitis C Screening recommended: no HIV Screening recommended: no  Advanced Directives: Written information was not prepared per patient's request.  Referrals & Orders No orders of the defined types were placed in this encounter.   Follow-up Plan . Follow-up with Grace Balloon, FNP as planned . Schedule yearly colonoscopy follow up . Schedule yearly eye exa . We will check Urine Microalbumin and foot exam at next visit.  . Patient is scheduled today for mammogram.    I have personally reviewed and noted the following in the patient's chart:   . Medical and social history . Use of alcohol, tobacco or illicit drugs  . Current medications and supplements . Functional ability and status . Nutritional status . Physical activity . Advanced directives . List of other physicians . Hospitalizations, surgeries, and ER visits in previous 12 months . Vitals . Screenings to include cognitive, depression, and falls . Referrals and appointments  In addition, I have reviewed and discussed with Grace Barajas certain preventive protocols, quality metrics, and best practice recommendations. A written personalized care plan for preventive services as well as general preventive health recommendations  is available and can be mailed to the patient at her request.      Gareth Morgan LPN  [id number]

## 2019-09-21 ENCOUNTER — Encounter: Payer: Medicare Other | Admitting: Family

## 2019-09-22 ENCOUNTER — Other Ambulatory Visit: Payer: Self-pay

## 2019-09-22 ENCOUNTER — Ambulatory Visit
Admission: RE | Admit: 2019-09-22 | Discharge: 2019-09-22 | Disposition: A | Discharge status: Home / Self Care | Payer: Medicare Other | Source: Ambulatory Visit | Attending: Family | Admitting: Family

## 2019-09-22 DIAGNOSIS — Z1231 Encounter for screening mammogram for malignant neoplasm of breast: Secondary | ICD-10-CM | POA: Diagnosis not present

## 2019-09-28 DIAGNOSIS — S83242A Other tear of medial meniscus, current injury, left knee, initial encounter: Secondary | ICD-10-CM

## 2019-09-28 HISTORY — PX: OTHER SURGICAL HISTORY: SHX169

## 2019-09-28 HISTORY — DX: Other tear of medial meniscus, current injury, left knee, initial encounter: S83.242A

## 2019-09-30 ENCOUNTER — Other Ambulatory Visit: Payer: Self-pay

## 2019-09-30 ENCOUNTER — Encounter (HOSPITAL_COMMUNITY)
Admission: RE | Admit: 2019-09-30 | Discharge: 2019-09-30 | Disposition: A | Discharge status: Home / Self Care | Payer: Medicare Other | Source: Ambulatory Visit | Attending: Pulmonary Disease | Admitting: Pulmonary Disease

## 2019-09-30 DIAGNOSIS — E041 Nontoxic single thyroid nodule: Secondary | ICD-10-CM | POA: Diagnosis not present

## 2019-09-30 DIAGNOSIS — E785 Hyperlipidemia, unspecified: Secondary | ICD-10-CM | POA: Diagnosis not present

## 2019-09-30 DIAGNOSIS — R911 Solitary pulmonary nodule: Secondary | ICD-10-CM

## 2019-09-30 DIAGNOSIS — R918 Other nonspecific abnormal finding of lung field: Secondary | ICD-10-CM | POA: Diagnosis not present

## 2019-09-30 DIAGNOSIS — Z79899 Other long term (current) drug therapy: Secondary | ICD-10-CM | POA: Insufficient documentation

## 2019-09-30 LAB — GLUCOSE, CAPILLARY: Glucose-Capillary: 109 mg/dL — ABNORMAL HIGH (ref 70–99)

## 2019-09-30 MED ORDER — FLUDEOXYGLUCOSE F - 18 (FDG) INJECTION
10.5000 | Freq: Once | INTRAVENOUS | Status: AC | PRN
  Administered 2019-09-30: 10.5 via INTRAVENOUS

## 2019-09-30 NOTE — Progress Notes (Signed)
PET scan still shows continued growth of right lower lobe nodule.  Still could favor hamartoma patient should be scheduled with Dr. Elsworth Soho to further review these PET scan results.  As well as to discuss follow-up and potential interventions.  Wyn Quaker, FNP

## 2019-10-01 ENCOUNTER — Encounter: Payer: Medicare Other | Attending: Family | Admitting: Registered"

## 2019-10-01 ENCOUNTER — Encounter: Payer: Self-pay | Admitting: Gastroenterology

## 2019-10-01 ENCOUNTER — Encounter: Payer: Self-pay | Admitting: Registered"

## 2019-10-01 DIAGNOSIS — E118 Type 2 diabetes mellitus with unspecified complications: Secondary | ICD-10-CM | POA: Insufficient documentation

## 2019-10-01 NOTE — Patient Instructions (Addendum)
Goal: In bed by 10 pm and up by 9 am. Look for videos for chair exercises until your knee is recovered from surgery. Increase water intake. Drink 16 oz with medications.  Food:  Aim to eat balanced meals Consider adding grains to your salads Continue using olive oil  Supplements: Consider putting your fish oil in the fridge Consider taking over the counter vitamin D to keep your levels up, 1,000 or 2,000 units.  Physical activity: 3-5x/ 30-45 min moderate intensity Flexibility through stretching. When you are comfortable going back to the [facility], continue with your plan to try yoga. Strengh training is important for bone strenth

## 2019-10-01 NOTE — Progress Notes (Signed)
Diabetes Self-Management Education  Visit Type: First/Initial  Appt. Start Time: 1030 Appt. End Time: [id number]  10/01/2019  [name], identified by name and date of birth, is a 53 y.o. female with a diagnosis of Diabetes: Type 2.   ASSESSMENT  Last menstrual period 01/11/2013. There is no height or weight on file to calculate BMI.   Pt states she lost 15-20 lbs last year when she was eating vegan, salads & smoothies, and going to the [facility] before COVID.   Pt is not able to do much exercise now d/t surgery scheduled in few days for torn meniscus.  Pt states about 2 weeks out of the month she will meal prep salads for the week. Pt states she is a snaker - cakes, cookies, pies, chips, doesn't drink water. Pt states she also likes to eat healthy foods such as baked salmon, tuna.  Pt states she does not want to take medication or shots. Pt reports she does not understand the A1c test value. RD plans to teach next visit.  Pt is lactose intolerant, sometimes will eat a little ice cream, but it tears her stomach up.  Sleep: up until 2-3 am; gets up 10:30-11:30; has had this sleep pattern for last 4 years getting second bachelors, currently in grad program. has never been a morning person. In summer it is easier for her to get up earlier.  Vitamin D level on the low end of normal.  Diabetes Self-Management Education - 10/01/19 1040      Visit Information   Visit Type  First/Initial      Initial Visit   Diabetes Type  Type 2    Are you currently following a meal plan?  No    Are you taking your medications as prescribed?  Not on Medications    Date Diagnosed  ?      Health Coping   How would you rate your overall health?  Good      Psychosocial Assessment   Patient Belief/Attitude about Diabetes  Motivated to manage diabetes    How often do you need to have someone help you when you read instructions, pamphlets, or other written materials from your doctor or pharmacy?  1 -  Never    What is the last grade level you completed in school?  16      Complications   Last HgB A1C per patient/outside source  6 %    How often do you check your blood sugar?  1-2 times/day    Fasting Blood glucose range (mg/dL)  70-129    Number of hypoglycemic episodes per month  0    Have you had a dilated eye exam in the past 12 months?  No    Have you had a dental exam in the past 12 months?  Yes    Are you checking your feet?  No      Dietary Intake   Breakfast  none    Snack (morning)  carbs (cookes, pies, chips)    Lunch  coffee, salad OR instant grits or oatmeal   1-2 pm   Snack (afternoon)  nuts    Dinner  salad, meat OR fast food [employer] 4 for $4    Snack (evening)  ice cream    Beverage(s)  hard to drink water, 2 c coffee, soda to settle stomach      Exercise   Exercise Type  ADL's      Patient Education   Previous Diabetes Education  No    Nutrition management   Role of diet in the treatment of diabetes and the relationship between the three main macronutrients and blood glucose level    Physical activity and exercise   Role of exercise on diabetes management, blood pressure control and cardiac health.      Individualized Goals (developed by patient)   Nutrition  General guidelines for healthy choices and portions discussed    Physical Activity  Exercise 3-5 times per week      Outcomes   Expected Outcomes  Demonstrated interest in learning. Expect positive outcomes    Future DMSE  2 months    Program Status  Not Completed       Individualized Plan for Diabetes Self-Management Training:   Learning Objective:  Patient will have a greater understanding of diabetes self-management. Patient education plan is to attend individual and/or group sessions per assessed needs and concerns.    Patient Instructions  Goal: In bed by 10 pm and up by 9 am. Look for videos for chair exercises until your knee is recovered from surgery. Increase water intake. Drink 16  oz with medications.  Food:  Aim to eat balanced meals Consider adding grains to your salads Continue using olive oil  Supplements: Consider putting your fish oil in the fridge Consider taking over the counter vitamin D to keep your levels up, 1,000 or 2,000 units.  Physical activity: 3-5x/ 30-45 min moderate intensity Flexibility through stretching. When you are comfortable going back to the [facility], continue with your plan to try yoga. Strengh training is important for bone strenth    Expected Outcomes:  Demonstrated interest in learning. Expect positive outcomes  Education material provided: sleep hygiene, snacks sheet, MyPlate  If problems or questions, patient to contact team via:  Phone and MyChart  Future DSME appointment: 2 months

## 2019-10-04 ENCOUNTER — Encounter: Payer: Self-pay | Admitting: Pulmonary Disease

## 2019-10-04 ENCOUNTER — Ambulatory Visit: Payer: Medicare Other | Admitting: Pulmonary Disease

## 2019-10-04 ENCOUNTER — Other Ambulatory Visit: Payer: Self-pay

## 2019-10-04 VITALS — BP 128/70 | HR 86 | Temp 98.2°F | Ht 66.0 in | Wt 213.2 lb

## 2019-10-04 DIAGNOSIS — R911 Solitary pulmonary nodule: Secondary | ICD-10-CM | POA: Diagnosis not present

## 2019-10-04 DIAGNOSIS — E041 Nontoxic single thyroid nodule: Secondary | ICD-10-CM

## 2019-10-04 DIAGNOSIS — G4733 Obstructive sleep apnea (adult) (pediatric): Secondary | ICD-10-CM

## 2019-10-04 DIAGNOSIS — R918 Other nonspecific abnormal finding of lung field: Secondary | ICD-10-CM

## 2019-10-04 NOTE — Patient Instructions (Addendum)
Ultrasound of thyroid gland will be scheduled as a 1 year follow-up I will discuss with multidisciplinary team and get back to you with recommendations  Get back on your CPAP machine  CT chest with contrast in 6 months

## 2019-10-04 NOTE — Assessment & Plan Note (Signed)
CPAP 10 cm seems to be adequate when used She is on auto CPAP settings. Weight loss encouraged, compliance with goal of at least 4-6 hrs every night is the expectation. Advised against medications with sedative side effects Cautioned against driving when sleepy - understanding that sleepiness will vary on a day to day basis

## 2019-10-04 NOTE — Assessment & Plan Note (Signed)
With negative PET x2, this is most likely benign nodule-differential diagnosis includes hematoma or AV malformation.  Prior CT with contrast has not clearly suggested this We will discuss in multidisciplinary conference -I would favor conservative approach with follow-up CT scan in 6 months rather than resection

## 2019-10-04 NOTE — Progress Notes (Signed)
   Subjective:    Patient ID: Grace Barajas, female    DOB: 10/21/1966, 53 y.o.   MRN: [id number]  HPI  53 yo never smoker for follow-up of moderate OSA and right lower lobe nodule   68mm Noted incidentally on CT scan when she had community-acquired pneumonia in 2018 This grew in size slowly to 1.6 cm noted on CAT scan 05/2019. We reviewed PET scan today which again did not show any evidence of hypermetabolism   She denies excessive daytime somnolence or fatigue.  She is alternating between her residence in [place] and and while at [place] and when she goes there does not take her CPAP with her which explains poor compliance noted on download report.  Significant tests/ events reviewed  PET 09/2019 1.7 x 1.2 cm , negative  CT chest 05/2019 Pulmonary nodule in the central right lower lobe measures 1.6 x 1.3 by 1.3 cm (volume = 1.4 cm^3), image 83/3 and image 85/5. In 2019 measured 1.5 x 0.9 by 1.2 cm (volume = 0.8 cm^3). On 03/18/2017 this nodule measured 1.1 x 0.9 by 0.9 cm    CT chest 08/2018 which shows stable size of nodule.  Another 4 mm nodule was seen in the right upper lobe.  1.5 cm right thyroid nodule was noted which appears to be new  CT chest 02/2018 shows continued growth of right lower lobe nodule now at 14 mm. PET scan did not show any hypermetabolism,there is also symmetrical activity in both tonsils and a groundglass lesion in the sphenoid bone which appears stable from 01/2017   10/2016-CT angiogram showeda right lower lobe 10 mm nodule. There were 2 thyroid nodules 10 mm calcified left lobe and 8 mm noncalcified left lobe and possible adrenal nodule was noted measuring 1.1 cm in the left.  NPSG 06/2014: AHI 15/hr, desat 89% 06/2015 AHI 16   Spirometry11/2017was a poor effort but showed normal lung function without airway obstruction  Review of Systems Patient denies significant dyspnea,cough, hemoptysis,  chest pain, palpitations, pedal edema,  orthopnea, paroxysmal nocturnal dyspnea, lightheadedness, nausea, vomiting, abdominal or  leg pains      Objective:   Physical Exam  Gen. Pleasant, in no distress ENT - no lesions, no post nasal drip Neck: No JVD, no thyromegaly, no carotid bruits Lungs: no use of accessory muscles, no dullness to percussion, decreased without rales or rhonchi  Cardiovascular: Rhythm regular, heart sounds  normal, no murmurs or gallops, no peripheral edema Musculoskeletal: No deformities, no cyanosis or clubbing , no tremors       Assessment & Plan:

## 2019-10-06 DIAGNOSIS — M1712 Unilateral primary osteoarthritis, left knee: Secondary | ICD-10-CM | POA: Diagnosis not present

## 2019-10-06 DIAGNOSIS — M94262 Chondromalacia, left knee: Secondary | ICD-10-CM | POA: Diagnosis not present

## 2019-10-06 DIAGNOSIS — S83242A Other tear of medial meniscus, current injury, left knee, initial encounter: Secondary | ICD-10-CM | POA: Diagnosis not present

## 2019-10-06 DIAGNOSIS — M94261 Chondromalacia, right knee: Secondary | ICD-10-CM | POA: Diagnosis not present

## 2019-10-06 DIAGNOSIS — S83241A Other tear of medial meniscus, current injury, right knee, initial encounter: Secondary | ICD-10-CM | POA: Diagnosis not present

## 2019-10-07 ENCOUNTER — Other Ambulatory Visit: Payer: Self-pay | Admitting: *Deleted

## 2019-10-07 NOTE — Progress Notes (Signed)
The purposed treatment plan is for discussion purpose only and is not a binding recommendation.  The patient was not physically examined nor present for their treatment options  Therefore, final treatment plans cannot be decided.    I updated Dr. Elsworth Soho on recommendations.

## 2019-10-25 ENCOUNTER — Telehealth: Payer: Self-pay | Admitting: *Deleted

## 2019-10-25 NOTE — Telephone Encounter (Signed)
NOS to PV.  LMOM for pt to call back to reschedule before the end of the day

## 2019-10-26 NOTE — Telephone Encounter (Signed)
Pt didn't return call to reschedule PV.  Letter sent and colonoscopy for 11-08-19 cancelled

## 2019-11-08 ENCOUNTER — Encounter: Payer: Medicare Other | Admitting: Gastroenterology

## 2019-11-15 ENCOUNTER — Telehealth: Payer: Self-pay | Admitting: *Deleted

## 2019-11-15 ENCOUNTER — Ambulatory Visit: Payer: Medicare Other

## 2019-11-15 NOTE — Progress Notes (Unsigned)
Patient did not answer phone or return message to complete virtual pre-visit .  Appointment cancelled and letter sent.

## 2019-11-15 NOTE — Telephone Encounter (Signed)
Patient did not answer phone or return message to call for missed virtual pre-visit.  Appointment cancelled and letter sent.

## 2019-11-22 ENCOUNTER — Encounter: Payer: Self-pay | Admitting: *Deleted

## 2019-11-22 NOTE — Telephone Encounter (Signed)
Window opened in error

## 2019-11-25 ENCOUNTER — Encounter: Payer: Self-pay | Admitting: Gastroenterology

## 2019-11-26 ENCOUNTER — Encounter: Payer: Medicare Other | Admitting: Gastroenterology

## 2019-12-06 DIAGNOSIS — M25562 Pain in left knee: Secondary | ICD-10-CM | POA: Diagnosis not present

## 2019-12-08 ENCOUNTER — Other Ambulatory Visit: Payer: Self-pay

## 2019-12-08 ENCOUNTER — Ambulatory Visit (AMBULATORY_SURGERY_CENTER): Payer: Medicare Other | Admitting: *Deleted

## 2019-12-08 ENCOUNTER — Encounter: Payer: Self-pay | Admitting: *Deleted

## 2019-12-08 VITALS — Ht 65.5 in | Wt 210.0 lb

## 2019-12-08 DIAGNOSIS — Z01818 Encounter for other preprocedural examination: Secondary | ICD-10-CM

## 2019-12-08 DIAGNOSIS — Z86010 Personal history of colonic polyps: Secondary | ICD-10-CM

## 2019-12-08 MED ORDER — SUTAB 1479-225-188 MG PO TABS: 1.0000 | ORAL_TABLET | ORAL | 0 refills | Status: DC

## 2019-12-08 NOTE — Progress Notes (Signed)
Patient's pre-visit was done today over the phone with the patient due to COVID-19 pandemic. Name,DOB and address verified. Insurance verified. Packet of Prep instructions mailed to patient including copy of a consent form and pre-procedure patient acknowledgement form-pt is aware. Patient understands to call us back with any questions or concerns. COVID-19 screening test is on 2/19, the patient is aware. Pt is aware that care partner will wait in the car during procedure; if they feel like they will be too hot or cold to wait in the car; they may wait in the 4 th floor lobby. Patient is aware to bring only one care partner. We want them to wear a mask (we do not have any that we can provide them), practice social distancing, and we will check their temperatures when they get here.  I did remind the patient that their care partner needs to stay in the parking lot the entire time and have a cell phone available, we will call them when the pt is ready for discharge. Patient will wear mask into building. 2day prep with Sutab and Miralax given to pt. Patient states she was unable to drink the suprep liquid preps last colon and she requested the "pills".  Marland Kitchen

## 2019-12-10 ENCOUNTER — Encounter: Payer: Medicare Other | Admitting: Registered"

## 2019-12-16 ENCOUNTER — Encounter: Payer: Self-pay | Admitting: Gastroenterology

## 2019-12-17 ENCOUNTER — Other Ambulatory Visit: Payer: Self-pay | Admitting: Gastroenterology

## 2019-12-17 ENCOUNTER — Other Ambulatory Visit: Payer: Self-pay

## 2019-12-17 ENCOUNTER — Ambulatory Visit (INDEPENDENT_AMBULATORY_CARE_PROVIDER_SITE_OTHER): Payer: Medicare Other

## 2019-12-17 DIAGNOSIS — Z1159 Encounter for screening for other viral diseases: Secondary | ICD-10-CM | POA: Diagnosis not present

## 2019-12-18 LAB — SARS CORONAVIRUS 2 (TAT 6-24 HRS): SARS Coronavirus 2: NEGATIVE

## 2019-12-20 ENCOUNTER — Telehealth: Payer: Self-pay | Admitting: Gastroenterology

## 2019-12-20 NOTE — Telephone Encounter (Signed)
Patient calling- stating that she is having problems getting the prep that was called in. Her colonoscopy is on 2/24. She states she will have to cancel if something does not get done within the next 30 mins or so- because she is waiting around trying to get prep.

## 2019-12-20 NOTE — Telephone Encounter (Signed)
Called Pharmacy- Sutabs are 154.45- gave Sutab coupon over the phone and prep price dropped to $40- pt aware- Pharmacy said prep will be there by 11 am Tuesday 2-23- informed pt of this as well-   Sutab coupon onformation-  BIN [id number]                                               PCN CN                                                GROUP [id number]                                              ID   [id number]

## 2019-12-22 ENCOUNTER — Other Ambulatory Visit: Payer: Self-pay

## 2019-12-22 ENCOUNTER — Ambulatory Visit (AMBULATORY_SURGERY_CENTER): Payer: Medicare Other | Admitting: Gastroenterology

## 2019-12-22 ENCOUNTER — Encounter: Payer: Self-pay | Admitting: Gastroenterology

## 2019-12-22 VITALS — BP 115/68 | HR 62 | Temp 96.8°F | Resp 14 | Ht 65.0 in | Wt 210.0 lb

## 2019-12-22 DIAGNOSIS — D128 Benign neoplasm of rectum: Secondary | ICD-10-CM | POA: Diagnosis not present

## 2019-12-22 DIAGNOSIS — Z1211 Encounter for screening for malignant neoplasm of colon: Secondary | ICD-10-CM | POA: Diagnosis not present

## 2019-12-22 DIAGNOSIS — Z86010 Personal history of colonic polyps: Secondary | ICD-10-CM

## 2019-12-22 DIAGNOSIS — D123 Benign neoplasm of transverse colon: Secondary | ICD-10-CM | POA: Diagnosis not present

## 2019-12-22 DIAGNOSIS — D124 Benign neoplasm of descending colon: Secondary | ICD-10-CM | POA: Diagnosis not present

## 2019-12-22 DIAGNOSIS — D129 Benign neoplasm of anus and anal canal: Secondary | ICD-10-CM

## 2019-12-22 MED ORDER — SODIUM CHLORIDE 0.9 % IV SOLN: 500.0000 mL | Freq: Once | INTRAVENOUS | Status: DC

## 2019-12-22 NOTE — Progress Notes (Signed)
To PACU, VSS. Report to [email or web address] 

## 2019-12-22 NOTE — Progress Notes (Signed)
Pt's states no medical or surgical changes since previsit or office visit.  Temp- Garretson

## 2019-12-22 NOTE — Patient Instructions (Signed)
Thank you for letting us take care of your healthcare needs today. Please see handouts given to you on Polyps and Hemorrhoids.    YOU HAD AN ENDOSCOPIC PROCEDURE TODAY AT [facility]:   Refer to the procedure report that was given to you for any specific questions about what was found during the examination.  If the procedure report does not answer your questions, please call your gastroenterologist to clarify.  If you requested that your care partner not be given the details of your procedure findings, then the procedure report has been included in a sealed envelope for you to review at your convenience later.  YOU SHOULD EXPECT: Some feelings of bloating in the abdomen. Passage of more gas than usual.  Walking can help get rid of the air that was put into your GI tract during the procedure and reduce the bloating. If you had a lower endoscopy (such as a colonoscopy or flexible sigmoidoscopy) you may notice spotting of blood in your stool or on the toilet paper. If you underwent a bowel prep for your procedure, you may not have a normal bowel movement for a few days.  Please Note:  You might notice some irritation and congestion in your nose or some drainage.  This is from the oxygen used during your procedure.  There is no need for concern and it should clear up in a day or so.  SYMPTOMS TO REPORT IMMEDIATELY:   Following lower endoscopy (colonoscopy or flexible sigmoidoscopy):  Excessive amounts of blood in the stool  Significant tenderness or worsening of abdominal pains  Swelling of the abdomen that is new, acute  Fever of 100F or higher   For urgent or emergent issues, a gastroenterologist can be reached at any hour by calling [phone].   DIET:  We do recommend a small meal at first, but then you may proceed to your regular diet.  Drink plenty of fluids but you should avoid alcoholic beverages for 24 hours.  ACTIVITY:  You should plan to take it easy for the  rest of today and you should NOT DRIVE or use heavy machinery until tomorrow (because of the sedation medicines used during the test).    FOLLOW UP: Our staff will call the number listed on your records 48-72 hours following your procedure to check on you and address any questions or concerns that you may have regarding the information given to you following your procedure. If we do not reach you, we will leave a message.  We will attempt to reach you two times.  During this call, we will ask if you have developed any symptoms of COVID 19. If you develop any symptoms (ie: fever, flu-like symptoms, shortness of breath, cough etc.) before then, please call [phone].  If you test positive for Covid 19 in the 2 weeks post procedure, please call and report this information to [place].    If any biopsies were taken you will be contacted by phone or by letter within the next 1-3 weeks.  Please call us at [phone] if you have not heard about the biopsies in 3 weeks.    SIGNATURES/CONFIDENTIALITY: You and/or your care partner have signed paperwork which will be entered into your electronic medical record.  These signatures attest to the fact that that the information above on your After Visit Summary has been reviewed and is understood.  Full responsibility of the confidentiality of this discharge information lies with you and/or your care-partner.

## 2019-12-22 NOTE — Progress Notes (Signed)
Called to room to assist during endoscopic procedure.  Patient ID and intended procedure confirmed with present staff. Received instructions for my participation in the procedure from the performing physician.  

## 2019-12-22 NOTE — Op Note (Signed)
[place] Patient Name: Grace Barajas Procedure Date: 12/22/2019 2:06 PM MRN: [id number] Endoscopist: Mauri Pole , MD Age: 54 Referring MD:  Date of Birth: Aug 19, 1966 Gender: Female Account #: [id number] Procedure:                Colonoscopy Indications:              High risk colon cancer surveillance: Personal                            history of colonic polyps, High risk colon cancer                            surveillance: Personal history of adenoma (10 mm or                            greater in size), High risk colon cancer                            surveillance: Personal history of multiple (3 or                            more) adenomas Medicines:                Monitored Anesthesia Care Procedure:                Pre-Anesthesia Assessment:                           - Prior to the procedure, a History and Physical                            was performed, and patient medications and                            allergies were reviewed. The patient's tolerance of                            previous anesthesia was also reviewed. The risks                            and benefits of the procedure and the sedation                            options and risks were discussed with the patient.                            All questions were answered, and informed consent                            was obtained. Prior Anticoagulants: The patient has                            taken no previous anticoagulant or antiplatelet  agents. ASA Grade Assessment: II - A patient with                            mild systemic disease. After reviewing the risks                            and benefits, the patient was deemed in                            satisfactory condition to undergo the procedure.                           After obtaining informed consent, the colonoscope                            was passed under direct vision. Throughout the                            procedure, the patient's blood pressure, pulse, and                            oxygen saturations were monitored continuously. The                            Colonoscope was introduced through the anus and                            advanced to the the cecum, identified by                            appendiceal orifice and ileocecal valve. The                            colonoscopy was performed without difficulty. The                            patient tolerated the procedure well. The quality                            of the bowel preparation was excellent. The                            ileocecal valve, appendiceal orifice, and rectum                            were photographed. Scope In: 2:11:12 PM Scope Out: 2:36:46 PM Scope Withdrawal Time: 0 hours 16 minutes 56 seconds  Total Procedure Duration: 0 hours 25 minutes 34 seconds  Findings:                 The perianal and digital rectal examinations were                            normal.  Two sessile polyps were found in the descending                            colon and transverse colon. The polyps were 3 to 4                            mm in size. These polyps were removed with a cold                            snare. Resection and retrieval were complete.                           Two sessile polyps were found in the rectum and                            transverse colon. The polyps were 1 to 2 mm in                            size. These polyps were removed with a cold biopsy                            forceps. Resection and retrieval were complete.                           Non-bleeding internal hemorrhoids were found during                            retroflexion. The hemorrhoids were small. Complications:            No immediate complications. Estimated Blood Loss:     Estimated blood loss was minimal. Impression:               - Two 3 to 4 mm polyps in the descending colon  and                            in the transverse colon, removed with a cold snare.                            Resected and retrieved.                           - Two 1 to 2 mm polyps in the rectum and in the                            transverse colon, removed with a cold biopsy                            forceps. Resected and retrieved.                           - Non-bleeding internal hemorrhoids. Recommendation:           - Patient has a contact number available for  emergencies. The signs and symptoms of potential                            delayed complications were discussed with the                            patient. Return to normal activities tomorrow.                            Written discharge instructions were provided to the                            patient.                           - Resume previous diet.                           - Continue present medications.                           - Await pathology results.                           - Repeat colonoscopy in 3 - 5 years for                            surveillance based on pathology results. Mauri Pole, MD 12/22/2019 2:40:41 PM This report has been signed electronically.

## 2019-12-24 ENCOUNTER — Telehealth: Payer: Self-pay

## 2019-12-24 NOTE — Telephone Encounter (Signed)
  Follow up Call-  Call back number 12/22/2019 06/25/2018  Post procedure Call Back phone  # [phone]  Permission to leave phone message Yes Yes  Some recent data might be hidden     Patient questions:  Do you have a fever, pain , or abdominal swelling? No. Pain Score  0 *  Have you tolerated food without any problems? Yes.    Have you been able to return to your normal activities? Yes.    Do you have any questions about your discharge instructions: Diet   No. Medications  No. Follow up visit  No.  Do you have questions or concerns about your Care? No.  Actions: * If pain score is 4 or above: No action needed, pain <4.    1. Have you developed a fever since your procedure? No  2.   Have you had an respiratory symptoms (SOB or cough) since your procedure? No  3.   Have you tested positive for COVID 19 since your procedure No  4.   Have you had any family members/close contacts diagnosed with the COVID 19 since your procedure?  No   If yes to any of these questions please route to Joylene John, RN and Alphonsa Gin, RN.

## 2019-12-24 NOTE — Telephone Encounter (Signed)
Left message on follow up call. 

## 2019-12-30 ENCOUNTER — Encounter: Payer: Self-pay | Admitting: Gastroenterology

## 2020-01-14 DIAGNOSIS — R0683 Snoring: Secondary | ICD-10-CM | POA: Diagnosis not present

## 2020-01-14 DIAGNOSIS — G4733 Obstructive sleep apnea (adult) (pediatric): Secondary | ICD-10-CM | POA: Diagnosis not present

## 2020-02-11 DIAGNOSIS — M1712 Unilateral primary osteoarthritis, left knee: Secondary | ICD-10-CM | POA: Diagnosis not present

## 2020-02-18 DIAGNOSIS — M1712 Unilateral primary osteoarthritis, left knee: Secondary | ICD-10-CM | POA: Diagnosis not present

## 2020-02-25 DIAGNOSIS — M1712 Unilateral primary osteoarthritis, left knee: Secondary | ICD-10-CM | POA: Diagnosis not present

## 2020-03-03 ENCOUNTER — Telehealth: Payer: Self-pay | Admitting: Pulmonary Disease

## 2020-03-03 DIAGNOSIS — R911 Solitary pulmonary nodule: Secondary | ICD-10-CM

## 2020-03-03 NOTE — Telephone Encounter (Signed)
Dr. Elsworth Soho please advise on message from patient.

## 2020-03-06 ENCOUNTER — Other Ambulatory Visit: Payer: Self-pay | Admitting: Pulmonary Disease

## 2020-03-06 ENCOUNTER — Telehealth: Payer: Self-pay | Admitting: Pulmonary Disease

## 2020-03-06 DIAGNOSIS — R911 Solitary pulmonary nodule: Secondary | ICD-10-CM

## 2020-03-06 NOTE — Telephone Encounter (Signed)
Spoke to pt and advised of the following recommendations per Dr Elsworth Soho:   Grace Noel, MD to Grace Barajas, CMA     11:39 AM Note   She was presented in cancer conference December 2020. Recommendation was to obtain Dotatate PET scan and then follow-up office visit with me. She may need resection and referral to T [facility]     Pt verbalized understanding and is aware of date and time for upcoming PET scan.

## 2020-03-06 NOTE — Telephone Encounter (Signed)
She was presented in cancer conference December 2020. Recommendation was to obtain Dotatate PET scan and then follow-up office visit with me. She may need resection and referral to T [facility]

## 2020-03-07 NOTE — Telephone Encounter (Signed)
Dr. Elsworth Soho, pt has CT scan scheduled for June but also has a PET scan in May. We should cancel the CT?

## 2020-03-07 NOTE — Telephone Encounter (Signed)
Called pt and advised message from the provider. Pt understood and verbalized understanding. Nothing further is needed.    

## 2020-03-07 NOTE — Telephone Encounter (Signed)
Please cancel CT

## 2020-03-07 NOTE — Telephone Encounter (Signed)
I called  [facility] to cancel the CT. Nothing further is needed.

## 2020-03-10 ENCOUNTER — Encounter: Payer: Self-pay | Admitting: Pulmonary Disease

## 2020-03-10 ENCOUNTER — Ambulatory Visit (INDEPENDENT_AMBULATORY_CARE_PROVIDER_SITE_OTHER): Payer: Medicare Other | Admitting: Pulmonary Disease

## 2020-03-10 DIAGNOSIS — R918 Other nonspecific abnormal finding of lung field: Secondary | ICD-10-CM

## 2020-03-10 DIAGNOSIS — R911 Solitary pulmonary nodule: Secondary | ICD-10-CM

## 2020-03-10 NOTE — Progress Notes (Signed)
03/10/2020  Peer-to-peer: Dr. Lockie Pares - [employer]  Case number: [id number]  Patient's case was discussed at tumor board in December/2020.  Recommendation was to obtain a PET scan in the follow-up with Dr. Elsworth Soho.  Patient may need referral to [facility].  Tests reviewed: 10/2016-CT angiogram showeda right lower lobe 10 mm nodule. >>>There were 2 thyroid nodules 10 mm calcified left lobe and 8 mm noncalcified left lobe and possible adrenal nodule was noted measuring 1.1 cm in the left.  CT chest 02/2018 shows continued growth of right lower lobe nodule now at 14 mm. >>>PET scan did not show any hypermetabolism,there is also symmetrical activity in both tonsils and a groundglass lesion in the sphenoid bone which appears stable from 01/2017  06/28/2019-CT chest with contrast- continued slow interval growth of right lower lobe pulmonary nodule, findings may represent slowly growing benign pulmonary nodule versus low-grade indolent pulmonary neoplasm, stable right upper lobe pulmonary nodule within the posterior right upper lobe, right lower lobe pulmonary nodular nodule measuring 1.6 x 1.3 x 1.3 cm on previous exam this measured 1.5 x 0.9 x 1.2 cm.  09/30/2019-PET scan-continued interval growth of pulmonary nodule in the right lower lobe without increased FDG uptake, given continued growth despite lack of FDG uptake biopsy should be considered, left thyroid nodule 1.8 cm recommend further evaluation by thyroid ultrasound    Case reviewed with Dr. Lockie Pares.  She reports that you not healthcare will not cover a repeat PET scan.  She reports that even the repeat PET scan that in December/2020 that was completed should not have been covered.  Since the patient has not had any FDG uptake they will not cover another PET scan moving forward.  Patient needs to have biopsy and tissue diagnosis.  PET scan is denied.   Will route to Dr. Elsworth Soho as Juluis Rainier.  Dr. Elsworth Soho how would you like to best proceed?  Would you  like for the patient to get scheduled for a bronch/ biopsy with you?  Wyn Quaker, FNP

## 2020-03-14 ENCOUNTER — Ambulatory Visit (HOSPITAL_COMMUNITY): Payer: Medicare Other

## 2020-03-21 NOTE — Progress Notes (Signed)
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

## 2020-03-22 NOTE — Telephone Encounter (Signed)
Patrice I'm not sure if you can help with this or who it needs to go to but please advise on patient mychart message. Looks like it was a peer to peer for the patient not an appt for the patient.  I am marked as a NO SHOW on May 14 with Wyn Quaker, this needs to be removed from my account. This was a phone visit with Aaron Edelman and [facility], NOT ME. Please update my account accordingly.  Thanks

## 2020-03-31 ENCOUNTER — Other Ambulatory Visit: Payer: Medicare Other

## 2020-04-07 DIAGNOSIS — M25562 Pain in left knee: Secondary | ICD-10-CM | POA: Diagnosis not present

## 2020-04-10 ENCOUNTER — Other Ambulatory Visit: Payer: Self-pay

## 2020-04-10 ENCOUNTER — Ambulatory Visit: Payer: Medicare Other | Admitting: Pulmonary Disease

## 2020-04-10 ENCOUNTER — Encounter: Payer: Self-pay | Admitting: Pulmonary Disease

## 2020-04-10 DIAGNOSIS — G4733 Obstructive sleep apnea (adult) (pediatric): Secondary | ICD-10-CM

## 2020-04-10 DIAGNOSIS — R911 Solitary pulmonary nodule: Secondary | ICD-10-CM

## 2020-04-10 DIAGNOSIS — E042 Nontoxic multinodular goiter: Secondary | ICD-10-CM

## 2020-04-10 NOTE — Assessment & Plan Note (Signed)
Weight loss encouraged, compliance with goal of at least 4-6 hrs every night is the expectation. Advised against medications with sedative side effects Cautioned against driving when sleepy - understanding that sleepiness will vary on a day to day basis  We briefly discussed inspire implant.  She will need to decrease her BMI to 32 or lower to qualify for this

## 2020-04-10 NOTE — Progress Notes (Signed)
   Subjective:    Patient ID: Grace Barajas, female    DOB: Mar 02, 1966, 54 y.o.   MRN: [id number]  HPI  80 yonever smoker for follow-up of moderate OSA and right lower lobe nodule  63mmNoted incidentally on CT scan when she had community-acquired pneumonia in 2018 This grew in size slowly to 1.6 cm noted on CAT scan 05/2019.   Reports allergy symptoms but otherwise breathing is stable. Feels better after using CPAP, download was reviewed shows auto settings 8 to 15 cm with average pressure of 10 cm, sporadic usage, likes nasal pillows but hold seems to flop around in bed and bother her.  We also reviewed prior thyroid ultrasound, previous biopsy was negative but multinodular goiter noted  Significant tests/ events reviewed  PET 09/2019 1.7 x 1.2 cm , negative  CT chest 05/2019 Pulmonary nodule in the central right lower lobe measures 1.6 x 1.3 by 1.3 cm (volume = 1.4 cm^3), image 83/3 and image 85/5. In 2019 measured 1.5 x 0.9 by 1.2 cm (volume = 0.8 cm^3). On 03/18/2017 this nodule measured 1.1 x 0.9 by 0.9 cm   US thyroid 09/2018 >> multinodular goiter. No definitive worrisome new or enlarging thyroid nodules.  Previously biopsied nodule within the right lobe of the thyroid (labeled 2), is grossly unchanged compared to the 11/2016 examination Left mid nodule >> 1 yr FU advised   10/2016-CT angiogram showeda right lower lobe 10 mm nodule. There were 2 thyroid nodules 10 mm calcified left lobe and 8 mm noncalcified left lobe and possible adrenal nodule was noted measuring 1.1 cm in the left.  NPSG 06/2014: AHI 15/hr, desat 89% 06/2015 AHI 16   Spirometry11/2017was a poor effort but showed normal lung function without airway obstruction  Review of Systems Patient denies significant dyspnea,cough, hemoptysis,  chest pain, palpitations, pedal edema, orthopnea, paroxysmal nocturnal dyspnea, lightheadedness, nausea, vomiting, abdominal or  leg pains      Objective:    Physical Exam  Gen. Pleasant, obese, in no distress ENT - no lesions, no post nasal drip Neck: No JVD, no thyromegaly, no carotid bruits Lungs: no use of accessory muscles, no dullness to percussion, decreased without rales or rhonchi  Cardiovascular: Rhythm regular, heart sounds  normal, no murmurs or gallops, no peripheral edema Musculoskeletal: No deformities, no cyanosis or clubbing , no tremors       Assessment & Plan:

## 2020-04-10 NOTE — Patient Instructions (Addendum)
Referral to T[facility] for lung nodule.  Please get back on your CPAP machine.  Schedule ultrasound thyroid to follow-up on thyroid nodule noted in 09/2018.  Please get your Covid vaccine

## 2020-04-10 NOTE — Assessment & Plan Note (Signed)
Schedule I year follow-up of thyroid nodule with ultrasound

## 2020-04-10 NOTE — Assessment & Plan Note (Signed)
Enlarging nodule, PET negative x2, favor carcinoid rather than hamartoma Discussed at multidisciplinary lung cancer conference, plan was to ordered dotatate PET scan but unfortunately turned down by insurance in spite of peer-to-peer review Options include further observation, ENB or proceed with resection. We'll refer to cardiothoracic surgery for second opinion

## 2020-04-11 ENCOUNTER — Telehealth: Payer: Self-pay | Admitting: Pulmonary Disease

## 2020-04-11 DIAGNOSIS — R911 Solitary pulmonary nodule: Secondary | ICD-10-CM

## 2020-04-11 NOTE — Telephone Encounter (Signed)
[facility] requested follow-up CT chest without contrast prior to consultation. Please schedule

## 2020-04-12 NOTE — Telephone Encounter (Signed)
Called pt and advised message from the provider. Pt understood and verbalized understanding. Nothing further is needed.   CT ordered.

## 2020-04-17 ENCOUNTER — Ambulatory Visit
Admission: RE | Admit: 2020-04-17 | Discharge: 2020-04-17 | Disposition: A | Discharge status: Home / Self Care | Payer: Medicare Other | Source: Ambulatory Visit | Attending: Pulmonary Disease | Admitting: Pulmonary Disease

## 2020-04-17 DIAGNOSIS — E041 Nontoxic single thyroid nodule: Secondary | ICD-10-CM | POA: Diagnosis not present

## 2020-04-17 DIAGNOSIS — R911 Solitary pulmonary nodule: Secondary | ICD-10-CM

## 2020-04-17 DIAGNOSIS — R918 Other nonspecific abnormal finding of lung field: Secondary | ICD-10-CM | POA: Diagnosis not present

## 2020-05-02 ENCOUNTER — Encounter: Payer: Medicare Other | Admitting: Thoracic Surgery (Cardiothoracic Vascular Surgery)

## 2020-05-17 DIAGNOSIS — Z20822 Contact with and (suspected) exposure to covid-19: Secondary | ICD-10-CM | POA: Diagnosis not present

## 2020-05-26 DIAGNOSIS — M1712 Unilateral primary osteoarthritis, left knee: Secondary | ICD-10-CM | POA: Diagnosis not present

## 2020-05-26 DIAGNOSIS — Z20822 Contact with and (suspected) exposure to covid-19: Secondary | ICD-10-CM | POA: Diagnosis not present

## 2020-05-27 ENCOUNTER — Telehealth: Payer: Self-pay | Admitting: Pulmonary Disease

## 2020-05-27 DIAGNOSIS — R911 Solitary pulmonary nodule: Secondary | ICD-10-CM

## 2020-05-27 NOTE — Telephone Encounter (Signed)
-----   Message from Ned Clines sent at 04/11/2020  4:00 PM EDT ----- Can you please order & schedule a new CT Chest on this patient and we will then schedule an appointment for the surgeon here at our office.  We need recent images.  Thanks  [facility] ----- Message ----- From: Laury Deep, RN Sent: 04/11/2020   2:59 PM EDT To: Ned Clines  See below from [facility] ----- Message ----- From: Melrose Nakayama, MD Sent: 04/11/2020   2:12 PM EDT To: Laury Deep, RN  I am happy to see her but it has been 6 months since her last scan so sh needs a new CT  [facility] ----- Message ----- From: Laury Deep, RN Sent: 04/11/2020  11:13 AM EDT To: Clarisa Kindred, MD  Dr. Roxan Hockey,   Please review this referral.  Would you offer anything?  Do you want an updated CT?  Thanks, Ryan ----- Message ----- From: Ned Clines Sent: 04/11/2020  10:56 AM EDT To: Laury Deep, RN  Enlarging lung nodule, HFG:BMSX, PET 09/30/2019, ct  chest 06/28/2019 (no recent ct)  let me know what I need to do or where to schedule. thanks  Enlarging nodule, PET negative x2, favor carcinoid rather than hamartoma Discussed at multidisciplinary lung cancer conference, plan was to ordered dotatate PET scan but unfortunately turned down by insurance in spite of peer-to-peer review Options include further observation, ENB or proceed with resection. We'll refer to cardiothoracic surgery for second opinion

## 2020-05-27 NOTE — Telephone Encounter (Signed)
Please refer to [facility] for consult - enlarging nodule ? carcinoid

## 2020-05-29 ENCOUNTER — Other Ambulatory Visit: Payer: Self-pay | Admitting: Orthopedic Surgery

## 2020-05-29 DIAGNOSIS — M25562 Pain in left knee: Secondary | ICD-10-CM

## 2020-05-29 NOTE — Telephone Encounter (Signed)
Referral placed to [facility] for consult.  Nothing further needed.

## 2020-05-30 ENCOUNTER — Telehealth: Payer: Self-pay | Admitting: Pulmonary Disease

## 2020-05-30 NOTE — Telephone Encounter (Signed)
As we discussed during OV , this could be a benign tumor such as carcinoid , so referral for a second opinion since it has grown in size over time

## 2020-05-30 NOTE — Telephone Encounter (Signed)
Patient contacted and explained why the referral was placed to surgeon. Patient verbalized understanding. She wanted Dr. Elsworth Barajas to know she tested positive for Covid, doing ok at home.

## 2020-05-30 NOTE — Telephone Encounter (Signed)
Patient asking why she is getting a referral to surgery when her results were called to her as stable. Please call or advise.

## 2020-06-01 ENCOUNTER — Telehealth: Payer: Self-pay | Admitting: Pulmonary Disease

## 2020-06-01 NOTE — Telephone Encounter (Signed)
Spoke with the pt and notified of the recommendations per TP  She will call her PCP since we have no openings prior to the w/e and she scheduled the next available with [facility] 06/21/20

## 2020-06-01 NOTE — Telephone Encounter (Signed)
Spoke with pt who states dry cough X 2 days. Denies fever/n/v/d/wheezing/ SOB. Uses Zyrtec and Flonase daily and OTC Sudafed which has not helped.  Tammy please advise. Thank you

## 2020-06-01 NOTE — Telephone Encounter (Signed)
Patient may try Mucinex DM twice daily as needed for cough and congestion.  If symptoms do not improve will need office visit for further evaluation.    If we do not have availability please follow-up with primary care provider and/or urgent care or emergency room if needed  Please contact office for sooner follow up if symptoms do not improve or worsen or seek emergency care

## 2020-06-02 ENCOUNTER — Telehealth: Payer: Self-pay | Admitting: Pulmonary Disease

## 2020-06-02 MED ORDER — HYDROCODONE-HOMATROPINE 5-1.5 MG/5ML PO SYRP: 5.0000 mL | ORAL_SOLUTION | Freq: Two times a day (BID) | ORAL | 0 refills | Status: AC | PRN

## 2020-06-02 NOTE — Telephone Encounter (Signed)
Called spoke with patient. She is wondering if she can get something stronger for her cough. She has been taking robitussin DM for the last 3 days with no relief, she can't sleep or do anything due to her cough. Nothing is working for her.   Dr. Elsworth Soho please advise on hycodan cough syrup and please send it RX or send [place] recommendations.

## 2020-06-02 NOTE — Telephone Encounter (Signed)
ATC pt, she answered the line but then the line went dead. Tried to call back, got a busy signal. Will try back.

## 2020-06-02 NOTE — Telephone Encounter (Signed)
Prescription for Hycodan was sent

## 2020-06-06 NOTE — Telephone Encounter (Signed)
I have left a detailed message with pt making her aware that this was taken care of.

## 2020-06-08 ENCOUNTER — Encounter: Payer: Medicare Other | Admitting: Thoracic Surgery (Cardiothoracic Vascular Surgery)

## 2020-06-09 ENCOUNTER — Encounter: Payer: Self-pay | Admitting: Thoracic Surgery (Cardiothoracic Vascular Surgery)

## 2020-06-09 ENCOUNTER — Institutional Professional Consult (permissible substitution): Payer: Medicare Other | Admitting: Thoracic Surgery (Cardiothoracic Vascular Surgery)

## 2020-06-09 ENCOUNTER — Other Ambulatory Visit: Payer: Self-pay

## 2020-06-09 VITALS — BP 124/77 | HR 80 | Temp 97.7°F | Resp 20 | Ht 65.5 in | Wt 209.0 lb

## 2020-06-09 DIAGNOSIS — R911 Solitary pulmonary nodule: Secondary | ICD-10-CM

## 2020-06-09 NOTE — Progress Notes (Signed)
PCP is Sharion Balloon, FNP Referring Provider is Rigoberto Noel, MD  Chief Complaint  Patient presents with   Lung Lesion    Surgical consult, Chest CT 04/17/20, last PET Scan 09/30/19     HPI: [name] is sent for consultation re: a right lower lobe lung nodule  Grace Barajas is a 54 yo woman with a past history of obesity, sleep apnea, anxiety, depression, schizoaffective disorder and GERD. She is a life long nonsmoker. She was first noted to have a lung nodule in 2018. It was a smoothly marginated 7 x 9 mm nodule at that time. She has been followed since then. Over time the is grown to 16 x 12 mm.  She did have a PET/CT last December which showed no hypermetabolic activity.  Her CT in June of this year showed it again at around 16 x 12 mm.  Still smoothly marginated.  No hilar or mediastinal adenopathy.  She is a lifelong non-smoker.  No chest pain, pressure, tightness, or shortness of breath.  She has had a heart murmur on exam.  She has not had any change in appetite, weight loss, fevers, chills, or sweats.  Zubrod Score: At the time of surgery this patients most appropriate activity status/level should be described as: _0     0    Normal activity, no symptoms _1     1    Restricted in physical strenuous activity but ambulatory, able to do out light work _2     2    Ambulatory and capable of self care, unable to do work activities, up and about >50 % of waking hours                              _3     3    Only limited self care, in bed greater than 50% of waking hours _4     4    Completely disabled, no self care, confined to bed or chair _5     5    Moribund  Past Medical History:  Diagnosis Date   Allergy    SEASONAL   Anxiety    Depression    GERD (gastroesophageal reflux disease)    Heart murmur    High cholesterol    Pneumonia    Sinus disorder    Sleep apnea    uses CPAP   Sleep apnea with use of continuous positive airway pressure (CPAP)    Snores     Wears glasses     Past Surgical History:  Procedure Laterality Date   BILATERAL SALPINGECTOMY Bilateral 01/21/2013   Procedure: BILATERAL SALPINGECTOMY;  Surgeon: Marvene Staff, MD;  Location: [facility];  Service: Gynecology;  Laterality: Bilateral;   CARPAL TUNNEL RELEASE Left 07/03/2007   CARPAL TUNNEL RELEASE Right 07/31/2007   CERVICAL FUSION     C1/C2   COLONOSCOPY  06/25/2018   DORSAL COMPARTMENT RELEASE Bilateral 01/13/2014   Procedure: BILATERAL 1ST DORSAL COMPARTMENT RELEASES;  Surgeon: Cammie Sickle., MD;  Location: [facility];  Service: Orthopedics;  Laterality: Bilateral;  bilateral   ROBOTIC ASSISTED TOTAL HYSTERECTOMY N/A 01/21/2013   Procedure: ROBOTIC ASSISTED TOTAL HYSTERECTOMY;  Surgeon: Marvene Staff, MD;  Location: [facility];  Service: Gynecology;  Laterality: N/A;   TUBAL LIGATION      Family History  Problem Relation Age of Onset   Asthma Son    Asthma Brother    Obesity Father  Diabetes Father    Heart disease Father    Hyperlipidemia Father    Diabetes Maternal Grandmother    Diabetes Maternal Aunt    Diabetes Maternal Uncle    Colon polyps Maternal Uncle    Thyroid disease Neg Hx    Breast cancer Neg Hx    Colon cancer Neg Hx    Esophageal cancer Neg Hx    Rectal cancer Neg Hx    Stomach cancer Neg Hx     Social History Social History   Tobacco Use   Smoking status: Never Smoker   Smokeless tobacco: Never Used  Scientific laboratory technician Use: Never used  Substance Use Topics   Alcohol use: Yes    Comment: social   Drug use: No    Current Outpatient Medications  Medication Sig Dispense Refill   APPLE CIDER VINEGAR PO Take by mouth.     Ascorbic Acid (VITAMIN C PO) Take by mouth.     Biotin 5 MG TABS Take by mouth.     blood glucose meter kit and supplies Dispense based on patient and insurance preference. Use up to four times daily as directed. (FOR ICD-10 E10.9, E11.9). 1 each 0    buPROPion (WELLBUTRIN XL) 300 MG 24 hr tablet Take 300 mg by mouth daily.     CALCIUM PO Take by mouth.     cetirizine (ZYRTEC) 10 MG chewable tablet Chew 10 mg by mouth daily.     Cholecalciferol (VITAMIN D3 PO) Take by mouth.     ferrous sulfate 325 (65 FE) MG tablet Take 325 mg by mouth daily with breakfast.     FLUoxetine (PROZAC) 20 MG capsule Take 20 mg by mouth daily.     FLUoxetine (PROZAC) 40 MG capsule Take 40 mg by mouth.     fluticasone (FLONASE) 50 MCG/ACT nasal spray Place 2 sprays into both nostrils daily.     HYDROcodone-homatropine (HYCODAN) 5-1.5 MG/5ML syrup Take 5 mLs by mouth 2 (two) times daily as needed for up to 12 days for cough. 120 mL 0   Omega-3 Fatty Acids (FISH OIL) 1000 MG CAPS Take 1 capsule by mouth daily.      perphenazine (TRILAFON) 8 MG tablet Take by mouth.     Current Facility-Administered Medications  Medication Dose Route Frequency Provider Last Rate Last Admin   0.9 %  sodium chloride infusion  500 mL Intravenous Once Nandigam, Kavitha V, MD        Allergies  Allergen Reactions   Morphine And Related     "Felt like an addict needing a fix."    Review of Systems  Constitutional: Negative for activity change, appetite change, fatigue and unexpected weight change.  HENT: Negative for trouble swallowing and voice change.   Respiratory: Negative for cough, shortness of breath and wheezing.   Cardiovascular: Negative for chest pain, palpitations and leg swelling.  Gastrointestinal: Negative for abdominal pain.  Genitourinary: Negative for difficulty urinating and dyspareunia.  Musculoskeletal: Positive for arthralgias and joint swelling.  Neurological: Negative for syncope and weakness.  Hematological: Negative for adenopathy. Does not bruise/bleed easily.  Psychiatric/Behavioral: Positive for dysphoric mood. The patient is nervous/anxious.   All other systems reviewed and are negative.   BP 124/77    Pulse 80    Temp 97.7 F  (36.5 C) (Skin)    Resp 20    Ht 5' 5.5" (1.664 m)    Wt 209 lb (94.8 kg)    LMP 01/11/2013  SpO2 96% Comment: RA   BMI 34.25 kg/m  Physical Exam Vitals reviewed.  Constitutional:      General: She is not in acute distress.    Appearance: She is obese.  HENT:     Head: Normocephalic and atraumatic.  Eyes:     General: No scleral icterus.    Extraocular Movements: Extraocular movements intact.  Cardiovascular:     Rate and Rhythm: Normal rate and regular rhythm.     Heart sounds: Murmur (2/6 systolic) heard.   Pulmonary:     Effort: Pulmonary effort is normal. No respiratory distress.     Breath sounds: Normal breath sounds. No wheezing or rales.  Abdominal:     General: There is no distension.     Palpations: Abdomen is soft.     Tenderness: There is no abdominal tenderness.  Musculoskeletal:        General: No swelling.     Cervical back: Neck supple.  Lymphadenopathy:     Cervical: No cervical adenopathy.  Skin:    General: Skin is warm and dry.  Neurological:     General: No focal deficit present.     Mental Status: She is alert and oriented to person, place, and time.     Cranial Nerves: No cranial nerve deficit.    Diagnostic Tests: CT CHEST WITHOUT CONTRAST  TECHNIQUE: Multidetector CT imaging of the chest was performed following the standard protocol without IV contrast.  COMPARISON:  09/30/2019 PET-CT.  06/28/2019 chest CT.  FINDINGS: Cardiovascular: Normal heart size. No significant pericardial effusion/thickening. Great vessels are normal in course and caliber.  Mediastinum/Nodes: Stable left thyroid nodules, including a dominant calcified 1.2 cm superior left thyroid nodule. Unremarkable esophagus. No pathologically enlarged axillary, mediastinal or hilar lymph nodes, noting limited sensitivity for the detection of hilar adenopathy on this noncontrast study.  Lungs/Pleura: No pneumothorax. No pleural effusion. Medial right lower lobe 1.6 x  1.2 cm solid pulmonary nodule (series 8/image 70), previously 1.6 x 1.3 cm on 06/28/2019 chest CT, stable in the interval, in the creased from the baseline 11/10/2016 chest CT study where it measured 1.2 x 0.8 cm using similar measurement technique. Two tiny 2 mm apical right upper lobe solid pulmonary nodules are stable (series 8/image 32 and image 21). No acute consolidative airspace disease, lung masses or new significant pulmonary nodules.  Upper abdomen: Right adrenal 1.6 cm nodule with density 16 HU, stable, compatible with an adenoma.  Musculoskeletal: No aggressive appearing focal osseous lesions. Moderate thoracic spondylosis.  IMPRESSION: 1. Medial right lower lobe 1.6 cm solid pulmonary nodule, stable in the interval since 06/28/2019 chest CT, increased from baseline 2018 chest CT, non hypermetabolic on prior PET-CT. Differential continues to include benign or low-grade indolent neoplastic pulmonary nodule. Suggest follow-up chest CT in 12-24 months. 2. No thoracic adenopathy or other findings of metastatic disease in the chest. 3. Stable right adrenal adenoma. 4. Dominant calcified 1.2 cm superior left thyroid nodule. This has been evaluated on previous imaging (correlate with concurrent thyroid ultrasound from today). (Ref: J Am Coll Radiol. 2015 Feb;12(2): 143-50).   Electronically Signed   By: Ilona Sorrel M.D.   On: 04/17/2020 16:01  I personally reviewed the CT images and concur with the findings noted above  Impression: Grace Barajas is a 54 year old non-smoker with a past history of obesity, sleep apnea, anxiety, depression, schizoaffective disorder and GERD.  She was first found to have a right lower lobe lung nodule in 2018 on a CT angiogram.  She has been followed since that time.  The nodule has gradually increased in size.  There was no hypermetabolic activity on PET/CT.  I made it clear to her that there was no rush to make a decision given the  slow progression of this nodule.  We discussed 3 potential options.  First would be to continue to follow radiographically.  Second would be bronchoscopic biopsy.  And finally surgical resection is an option as well.  There is no rush to intervene and continuing to follow radiographically is an option.  However, this is adjacent to her airways and might some point obstruct the airways making her susceptible to recurrent infections.  I do not think a bronchoscopic biopsy will really affect our decision making.  If it is positive for carcinoid we would recommend surgical resection.  But that would also be the recommendation if we had continued growth.  It is very likely that it will not be definitive and then were left with a decision between radiographic follow-up and surgical resection.  I discussed possible surgical resection with her.  It is possible this could shell out but I suspect it would require resection.  If resection is performed, the nodule is very close to a basilar segmental arterial branch and airway, so would likely require segmentectomy.  She is a non-smoker and I have no doubt she has adequate pulmonary reserve to tolerate that procedure.  She would need pulmonary function testing if she decided to proceed.  The proposed operation would be a robotic right VATS for lower lobe segmentectomy.  I informed her of the general nature of the procedure including the need for general anesthesia, the incisions to be used, the use of drains to postoperatively, the expected hospital stay, and the overall recovery.  I informed her of the indications, risks, benefits, and alternatives.  She understands the risks include, but are not limited to death, MI, DVT, PE, bleeding, possible need for transfusion, infection, prolonged air leak, cardiac arrhythmias, as well as the possibility of other unforeseeable complications.  I encouraged her to think over her options and she will do so.  She will let us know  how she would like to proceed.  Plan: Robotic right VATS, lower lobe segmentectomy. Pt will call to schedule if she wishes to proceed. Pulmonary function testing if she decides to proceed.   Melrose Nakayama, MD [facility] [phone]

## 2020-06-21 ENCOUNTER — Ambulatory Visit: Payer: Medicare Other | Admitting: Pulmonary Disease

## 2020-07-04 ENCOUNTER — Ambulatory Visit
Admission: RE | Admit: 2020-07-04 | Discharge: 2020-07-04 | Disposition: A | Discharge status: Home / Self Care | Payer: Medicare Other | Source: Ambulatory Visit | Attending: Orthopedic Surgery | Admitting: Orthopedic Surgery

## 2020-07-04 DIAGNOSIS — M25562 Pain in left knee: Secondary | ICD-10-CM

## 2020-07-10 DIAGNOSIS — M25562 Pain in left knee: Secondary | ICD-10-CM | POA: Diagnosis not present

## 2020-07-28 DIAGNOSIS — S83242D Other tear of medial meniscus, current injury, left knee, subsequent encounter: Secondary | ICD-10-CM | POA: Diagnosis not present

## 2020-07-28 DIAGNOSIS — M84362A Stress fracture, left tibia, initial encounter for fracture: Secondary | ICD-10-CM | POA: Diagnosis not present

## 2020-08-07 DIAGNOSIS — Z79899 Other long term (current) drug therapy: Secondary | ICD-10-CM | POA: Diagnosis not present

## 2020-09-29 ENCOUNTER — Other Ambulatory Visit: Payer: Self-pay

## 2020-09-29 ENCOUNTER — Encounter (HOSPITAL_BASED_OUTPATIENT_CLINIC_OR_DEPARTMENT_OTHER): Payer: Self-pay | Admitting: Orthopedic Surgery

## 2020-09-29 NOTE — Progress Notes (Addendum)
Spoke w/ via phone for pre-op interview---pt Lab needs dos---- none              Lab results------chest ct 04-17-2020 epic COVID test ------10-02-2020 1220 Arrive at -------900am 10-05-2020 NPO after MN NO Solid Food.  Clear liquids from MN until---800 am then npo Medications to take morning of surgery -----wellbutrin, prozac, zyrtec Diabetic medication -----n/a Patient Special Instructions -----bring cpap mask tubing and machine and leave in car due to pt bring in cruthches and knee brace Pre-Op special Istructions -----none Patient verbalized understanding of instructions that were given at this phone interview. Patient denies shortness of breath, chest pain, fever, cough at this phone interview.

## 2020-10-02 ENCOUNTER — Other Ambulatory Visit (HOSPITAL_COMMUNITY)
Admission: RE | Admit: 2020-10-02 | Discharge: 2020-10-02 | Disposition: A | Discharge status: Home / Self Care | Payer: Medicare Other | Source: Ambulatory Visit | Attending: Orthopedic Surgery | Admitting: Orthopedic Surgery

## 2020-10-02 DIAGNOSIS — Z01812 Encounter for preprocedural laboratory examination: Secondary | ICD-10-CM | POA: Diagnosis not present

## 2020-10-02 DIAGNOSIS — Z20822 Contact with and (suspected) exposure to covid-19: Secondary | ICD-10-CM | POA: Diagnosis not present

## 2020-10-02 LAB — SARS CORONAVIRUS 2 (TAT 6-24 HRS): SARS Coronavirus 2: NEGATIVE

## 2020-10-05 ENCOUNTER — Ambulatory Visit (HOSPITAL_BASED_OUTPATIENT_CLINIC_OR_DEPARTMENT_OTHER): Payer: Medicare Other | Admitting: Anesthesiology

## 2020-10-05 ENCOUNTER — Ambulatory Visit (HOSPITAL_COMMUNITY): Payer: Medicare Other

## 2020-10-05 ENCOUNTER — Other Ambulatory Visit: Payer: Self-pay

## 2020-10-05 ENCOUNTER — Ambulatory Visit (HOSPITAL_BASED_OUTPATIENT_CLINIC_OR_DEPARTMENT_OTHER)
Admission: RE | Admit: 2020-10-05 | Discharge: 2020-10-05 | Disposition: A | Discharge status: Home / Self Care | Payer: Medicare Other | Attending: Orthopedic Surgery | Admitting: Orthopedic Surgery

## 2020-10-05 ENCOUNTER — Encounter (HOSPITAL_BASED_OUTPATIENT_CLINIC_OR_DEPARTMENT_OTHER): Payer: Self-pay | Admitting: Orthopedic Surgery

## 2020-10-05 ENCOUNTER — Encounter (HOSPITAL_BASED_OUTPATIENT_CLINIC_OR_DEPARTMENT_OTHER)
Admission: RE | Disposition: A | Discharge status: Home / Self Care | Payer: Self-pay | Source: Home / Self Care | Attending: Orthopedic Surgery

## 2020-10-05 DIAGNOSIS — S83282A Other tear of lateral meniscus, current injury, left knee, initial encounter: Secondary | ICD-10-CM | POA: Insufficient documentation

## 2020-10-05 DIAGNOSIS — M94262 Chondromalacia, left knee: Secondary | ICD-10-CM | POA: Insufficient documentation

## 2020-10-05 DIAGNOSIS — Z833 Family history of diabetes mellitus: Secondary | ICD-10-CM | POA: Insufficient documentation

## 2020-10-05 DIAGNOSIS — Z885 Allergy status to narcotic agent status: Secondary | ICD-10-CM | POA: Diagnosis not present

## 2020-10-05 DIAGNOSIS — Z8349 Family history of other endocrine, nutritional and metabolic diseases: Secondary | ICD-10-CM | POA: Insufficient documentation

## 2020-10-05 DIAGNOSIS — Z8616 Personal history of COVID-19: Secondary | ICD-10-CM | POA: Insufficient documentation

## 2020-10-05 DIAGNOSIS — S83242A Other tear of medial meniscus, current injury, left knee, initial encounter: Secondary | ICD-10-CM | POA: Diagnosis not present

## 2020-10-05 DIAGNOSIS — X58XXXA Exposure to other specified factors, initial encounter: Secondary | ICD-10-CM | POA: Insufficient documentation

## 2020-10-05 DIAGNOSIS — S83232A Complex tear of medial meniscus, current injury, left knee, initial encounter: Secondary | ICD-10-CM | POA: Insufficient documentation

## 2020-10-05 DIAGNOSIS — Z8371 Family history of colonic polyps: Secondary | ICD-10-CM | POA: Diagnosis not present

## 2020-10-05 DIAGNOSIS — Y939 Activity, unspecified: Secondary | ICD-10-CM | POA: Diagnosis not present

## 2020-10-05 DIAGNOSIS — Z79899 Other long term (current) drug therapy: Secondary | ICD-10-CM | POA: Diagnosis not present

## 2020-10-05 DIAGNOSIS — Z419 Encounter for procedure for purposes other than remedying health state, unspecified: Secondary | ICD-10-CM

## 2020-10-05 DIAGNOSIS — E119 Type 2 diabetes mellitus without complications: Secondary | ICD-10-CM | POA: Diagnosis not present

## 2020-10-05 DIAGNOSIS — Z825 Family history of asthma and other chronic lower respiratory diseases: Secondary | ICD-10-CM | POA: Diagnosis not present

## 2020-10-05 DIAGNOSIS — M2410 Other articular cartilage disorders, unspecified site: Secondary | ICD-10-CM | POA: Diagnosis not present

## 2020-10-05 DIAGNOSIS — Z8249 Family history of ischemic heart disease and other diseases of the circulatory system: Secondary | ICD-10-CM | POA: Diagnosis not present

## 2020-10-05 DIAGNOSIS — M84362A Stress fracture, left tibia, initial encounter for fracture: Secondary | ICD-10-CM | POA: Insufficient documentation

## 2020-10-05 DIAGNOSIS — E785 Hyperlipidemia, unspecified: Secondary | ICD-10-CM | POA: Diagnosis not present

## 2020-10-05 DIAGNOSIS — M2242 Chondromalacia patellae, left knee: Secondary | ICD-10-CM | POA: Diagnosis not present

## 2020-10-05 HISTORY — PX: KNEE ARTHROSCOPY WITH SUBCHONDROPLASTY: SHX6732

## 2020-10-05 HISTORY — DX: Prediabetes: R73.03

## 2020-10-05 HISTORY — DX: COVID-19: U07.1

## 2020-10-05 SURGERY — ARTHROSCOPY, KNEE, WITH SUBCHONDROPLASTY
Anesthesia: General | Site: Knee | Laterality: Left

## 2020-10-05 MED ORDER — SODIUM CHLORIDE 0.9 % IR SOLN
Status: DC | PRN
  Administered 2020-10-05: 3000 mL

## 2020-10-05 MED ORDER — ONDANSETRON HCL 4 MG/2ML IJ SOLN
INTRAMUSCULAR | Status: AC
  Filled 2020-10-05: qty 2

## 2020-10-05 MED ORDER — MIDAZOLAM HCL 2 MG/2ML IJ SOLN
INTRAMUSCULAR | Status: AC
  Filled 2020-10-05: qty 2

## 2020-10-05 MED ORDER — FENTANYL CITRATE (PF) 100 MCG/2ML IJ SOLN
INTRAMUSCULAR | Status: AC
  Filled 2020-10-05: qty 2

## 2020-10-05 MED ORDER — HYDROMORPHONE HCL 1 MG/ML IJ SOLN
INTRAMUSCULAR | Status: AC
  Filled 2020-10-05: qty 1

## 2020-10-05 MED ORDER — FENTANYL CITRATE (PF) 100 MCG/2ML IJ SOLN
25.0000 ug | INTRAMUSCULAR | Status: DC | PRN
  Administered 2020-10-05: 50 ug via INTRAVENOUS
  Administered 2020-10-05 (×2): 25 ug via INTRAVENOUS

## 2020-10-05 MED ORDER — PHENYLEPHRINE 40 MCG/ML (10ML) SYRINGE FOR IV PUSH (FOR BLOOD PRESSURE SUPPORT)
PREFILLED_SYRINGE | INTRAVENOUS | Status: DC | PRN
  Administered 2020-10-05: 80 ug via INTRAVENOUS

## 2020-10-05 MED ORDER — ACETAMINOPHEN 500 MG PO TABS
1000.0000 mg | ORAL_TABLET | Freq: Once | ORAL | Status: AC
  Administered 2020-10-05: 1000 mg via ORAL

## 2020-10-05 MED ORDER — PROPOFOL 10 MG/ML IV BOLUS
INTRAVENOUS | Status: DC | PRN
  Administered 2020-10-05: 50 mg via INTRAVENOUS
  Administered 2020-10-05: 150 mg via INTRAVENOUS
  Administered 2020-10-05: 50 mg via INTRAVENOUS

## 2020-10-05 MED ORDER — CEFAZOLIN SODIUM-DEXTROSE 2-4 GM/100ML-% IV SOLN
INTRAVENOUS | Status: AC
  Filled 2020-10-05: qty 100

## 2020-10-05 MED ORDER — ALBUTEROL SULFATE HFA 108 (90 BASE) MCG/ACT IN AERS
INHALATION_SPRAY | RESPIRATORY_TRACT | Status: AC
  Filled 2020-10-05: qty 6.7

## 2020-10-05 MED ORDER — PROPOFOL 10 MG/ML IV BOLUS
INTRAVENOUS | Status: AC
  Filled 2020-10-05: qty 20

## 2020-10-05 MED ORDER — HYDROMORPHONE HCL 1 MG/ML IJ SOLN
0.2500 mg | INTRAMUSCULAR | Status: DC | PRN
  Administered 2020-10-05: 0.25 mg via INTRAVENOUS
  Administered 2020-10-05: 0.5 mg via INTRAVENOUS
  Administered 2020-10-05: 0.25 mg via INTRAVENOUS

## 2020-10-05 MED ORDER — ONDANSETRON HCL 4 MG/2ML IJ SOLN
INTRAMUSCULAR | Status: DC | PRN
  Administered 2020-10-05: 4 mg via INTRAVENOUS

## 2020-10-05 MED ORDER — DEXAMETHASONE SODIUM PHOSPHATE 10 MG/ML IJ SOLN
INTRAMUSCULAR | Status: AC
  Filled 2020-10-05: qty 1

## 2020-10-05 MED ORDER — OXYCODONE HCL 5 MG PO TABS
ORAL_TABLET | ORAL | Status: AC
  Filled 2020-10-05: qty 1

## 2020-10-05 MED ORDER — PHENYLEPHRINE 40 MCG/ML (10ML) SYRINGE FOR IV PUSH (FOR BLOOD PRESSURE SUPPORT)
PREFILLED_SYRINGE | INTRAVENOUS | Status: AC
  Filled 2020-10-05: qty 10

## 2020-10-05 MED ORDER — OXYCODONE-ACETAMINOPHEN 10-325 MG PO TABS
1.0000 | ORAL_TABLET | Freq: Four times a day (QID) | ORAL | 0 refills | Status: DC | PRN
Start: 2020-10-05 — End: 2021-03-08

## 2020-10-05 MED ORDER — ONDANSETRON 4 MG PO TBDP: 4.0000 mg | ORAL_TABLET | Freq: Three times a day (TID) | ORAL | 0 refills | Status: DC | PRN

## 2020-10-05 MED ORDER — DEXAMETHASONE SODIUM PHOSPHATE 10 MG/ML IJ SOLN
INTRAMUSCULAR | Status: DC | PRN
  Administered 2020-10-05: 10 mg via INTRAVENOUS

## 2020-10-05 MED ORDER — OXYCODONE HCL 5 MG PO TABS
5.0000 mg | ORAL_TABLET | Freq: Once | ORAL | Status: AC
  Administered 2020-10-05: 5 mg via ORAL

## 2020-10-05 MED ORDER — ALBUTEROL SULFATE HFA 108 (90 BASE) MCG/ACT IN AERS
INHALATION_SPRAY | RESPIRATORY_TRACT | Status: DC | PRN
  Administered 2020-10-05 (×2): 6 via RESPIRATORY_TRACT

## 2020-10-05 MED ORDER — MIDAZOLAM HCL 5 MG/5ML IJ SOLN
INTRAMUSCULAR | Status: DC | PRN
  Administered 2020-10-05: 1 mg via INTRAVENOUS

## 2020-10-05 MED ORDER — LACTATED RINGERS IV SOLN: INTRAVENOUS | Status: DC

## 2020-10-05 MED ORDER — ACETAMINOPHEN 500 MG PO TABS
ORAL_TABLET | ORAL | Status: AC
  Filled 2020-10-05: qty 2

## 2020-10-05 MED ORDER — FENTANYL CITRATE (PF) 100 MCG/2ML IJ SOLN
INTRAMUSCULAR | Status: DC | PRN
  Administered 2020-10-05 (×3): 50 ug via INTRAVENOUS

## 2020-10-05 MED ORDER — LIDOCAINE HCL (PF) 2 % IJ SOLN
INTRAMUSCULAR | Status: AC
  Filled 2020-10-05: qty 5

## 2020-10-05 MED ORDER — BUPIVACAINE-EPINEPHRINE 0.5% -1:200000 IJ SOLN
INTRAMUSCULAR | Status: DC | PRN
  Administered 2020-10-05: 16 mL

## 2020-10-05 MED ORDER — CEFAZOLIN SODIUM-DEXTROSE 2-4 GM/100ML-% IV SOLN
2.0000 g | INTRAVENOUS | Status: AC
  Administered 2020-10-05: 2 g via INTRAVENOUS

## 2020-10-05 MED ORDER — LIDOCAINE 2% (20 MG/ML) 5 ML SYRINGE
INTRAMUSCULAR | Status: DC | PRN
  Administered 2020-10-05: 80 mg via INTRAVENOUS

## 2020-10-05 SURGICAL SUPPLY — 40 items
ABLATOR ASPIRATE 50D MULTI-PRT (SURGICAL WAND) IMPLANT
BANDAGE ESMARK 6X9 LF (GAUZE/BANDAGES/DRESSINGS) IMPLANT
BLADE SHAVER TORPEDO 4X13 (MISCELLANEOUS) ×2 IMPLANT
BNDG CMPR 9X6 STRL LF SNTH (GAUZE/BANDAGES/DRESSINGS)
BNDG CMPR MED 15X6 ELC VLCR LF (GAUZE/BANDAGES/DRESSINGS) ×1
BNDG ELASTIC 6X15 VLCR STRL LF (GAUZE/BANDAGES/DRESSINGS) ×2 IMPLANT
BNDG ELASTIC 6X5.8 VLCR STR LF (GAUZE/BANDAGES/DRESSINGS) ×2 IMPLANT
BNDG ESMARK 6X9 LF (GAUZE/BANDAGES/DRESSINGS)
BURR CLEARCUT OVAL 5.5X13 (MISCELLANEOUS) IMPLANT
BURR OVAL 12 FL 5.5X13 (MISCELLANEOUS)
COVER MAYO STAND STRL (DRAPES) ×2 IMPLANT
COVER WAND RF STERILE (DRAPES) ×2 IMPLANT
CUFF TOURN SGL QUICK 34 (TOURNIQUET CUFF) ×2
CUFF TRNQT CYL 34X4.125X (TOURNIQUET CUFF) ×1 IMPLANT
DRAPE ARTHROSCOPY W/POUCH 114 (DRAPES) ×2 IMPLANT
DRAPE C-ARM 42X120 X-RAY (DRAPES) ×2 IMPLANT
DRAPE U-SHAPE 47X51 STRL (DRAPES) ×2 IMPLANT
DRSG PAD ABDOMINAL 8X10 ST (GAUZE/BANDAGES/DRESSINGS) ×2 IMPLANT
DURAPREP 26ML APPLICATOR (WOUND CARE) ×2 IMPLANT
GAUZE SPONGE 4X4 12PLY STRL (GAUZE/BANDAGES/DRESSINGS) ×2 IMPLANT
GAUZE XEROFORM 1X8 LF (GAUZE/BANDAGES/DRESSINGS) ×2 IMPLANT
GLOVE BIO SURGEON STRL SZ7.5 (GLOVE) ×4 IMPLANT
GLOVE INDICATOR 8.0 STRL GRN (GLOVE) ×4 IMPLANT
GOWN STRL REUS W/TWL XL LVL3 (GOWN DISPOSABLE) ×4 IMPLANT
IV NS IRRIG 3000ML ARTHROMATIC (IV SOLUTION) ×4 IMPLANT
KIT ACCUFILL 5CC (Knees) ×1 IMPLANT
KIT KNEE SCP 414.502 (Knees) ×2 IMPLANT
KIT TURNOVER CYSTO (KITS) ×2 IMPLANT
MANIFOLD NEPTUNE II (INSTRUMENTS) ×2 IMPLANT
NS IRRIG 500ML POUR BTL (IV SOLUTION) ×2 IMPLANT
PACK ARTHROSCOPY DSU (CUSTOM PROCEDURE TRAY) ×2 IMPLANT
PACK BASIN DAY SURGERY FS (CUSTOM PROCEDURE TRAY) ×2 IMPLANT
PADDING CAST COTTON 6X4 STRL (CAST SUPPLIES) ×2 IMPLANT
PORT APPOLLO RF 90DEGREE MULTI (SURGICAL WAND) IMPLANT
SUT ETHILON 4 0 PS 2 18 (SUTURE) ×2 IMPLANT
SYR CONTROL 10ML LL (SYRINGE) IMPLANT
TOWEL OR 17X26 10 PK STRL BLUE (TOWEL DISPOSABLE) ×2 IMPLANT
TUBE CONNECTING 12X1/4 (SUCTIONS) ×2 IMPLANT
TUBING ARTHROSCOPY IRRIG 16FT (MISCELLANEOUS) ×2 IMPLANT
WRAP KNEE MAXI GEL POST OP (GAUZE/BANDAGES/DRESSINGS) ×2 IMPLANT

## 2020-10-05 NOTE — Transfer of Care (Signed)
Immediate Anesthesia Transfer of Care Note  Patient: Grace Barajas  Procedure(s) Performed: Left knee arthroscopic partial medial meniscectomy with medial tibial subchondroplasty (Left Knee)  Patient Location: PACU  Anesthesia Type:General  Level of Consciousness: awake, alert , oriented and patient cooperative  Airway & Oxygen Therapy: Patient Spontanous Breathing and Patient connected to face mask oxygen  Post-op Assessment: Report given to RN and Post -op Vital signs reviewed and stable  Post vital signs: Reviewed and stable  Last Vitals:  Vitals Value Taken Time  BP 124/75 10/05/20 1140  Temp    Pulse 94 10/05/20 1142  Resp 18 10/05/20 1142  SpO2 99 % 10/05/20 1142  Vitals shown include unvalidated device data.  Last Pain:  Vitals:   10/05/20 0951  TempSrc: Oral  PainSc: 0-No pain      Patients Stated Pain Goal: 3 (83/37/44 5146)  Complications: No complications documented.

## 2020-10-05 NOTE — Op Note (Signed)
Surgeon(s): Nicholes Stairs, MD   ANESTHESIA:  general, and regional   FLUIDS: Per anesthesia record.    ESTIMATED BLOOD LOSS: minimal     PREOPERATIVE DIAGNOSES:   1. Left knee medial meniscus tear 2.  Left medial tibial plateau insufficiency fracture 3.   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 Left knee medial meniscus tear 2.  Left medial tibial plateau insufficiency fracture 3.   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  Left knee lateral meniscus tear, acute.  PROCEDURES PERFORMED:  1.  Left knee arthroscopically aided treatment of medial tibial plateau insufficiency fracture with percutaneous internal fixation (subchondroplasty)  2.  Left knee arthroscopy with arthroscopic partial medial  and lateral meniscectomies   3.  Chondroplasty, left medial femoral condyle   Implant: Flowable calcium phosphate, 5 mL. Zimmer   DESCRIPTION OF PROCEDURE: The patient has a left knee medial meniscus tear. They have had pain that has been refractory to conservative management. Their preoperative MRI demonstrated subchondral bone marrow edema and insufficiency fractures of the  medial tibial plateau as well as the medial meniscus tear. Plans are to proceed with partial medial meniscectomy, internal fixation of subchondral insufficiency fractures with flowable calcium phosphate, and diagnostic arthroscopy with debridement as indicated. Full discussion held regarding risks benefits alternatives and complications related surgical intervention. Conservative care options reviewed. All questions answered.   The patient was identified in the preoperative holding area and the operative extremity was marked. The patient was brought to the operating room and transferred to operating table in a supine position. Satisfactory general anesthesia was induced by anesthesiology.     Standard anterolateral, anteromedial  arthroscopy portals were obtained. The anteromedial portal was obtained with a spinal needle for localization under direct visualization with subsequent diagnostic findings.    Anteromedial and anterolateral chambers: moderate synovitis. The synovitis was debrided with a 4.5 mm full radius shaver through both the anteromedial and lateral portals.    Suprapatellar pouch and gutters: mild synovitis or debris. Patella chondral surface: Grade 3 Trochlear chondral surface: Grade 3 Patellofemoral tracking: level Medial meniscus: mid body complex degenerative tearing horizontal tearing of the body Medial femoral condyle flexion bearing surface: Grade 3 Medial femoral condyle extension bearing surface: Grade 3 Medial tibial plateau: Grade 3 Anterior cruciate ligament:stable Posterior cruciate ligament:stable Lateral meniscus: White zone tear of the mid body and anterior horn radial and horizontal Lateral femoral condyle flexion bearing surface: Grade 1 Lateral femoral condyle extension bearing surface: Grade 0 Lateral tibial plateau: Grade 1   Medial meniscus and lateral meniscus tears were debrided using biters and motorized shaver alternating until a stable remnant was left. Upon completion the probe was used to evaluate and assess the remaining meniscus which was gleaned to be stable.  On the medial side we debrided the undersurface leaflet with the biter and shaver.  Laterally, we trimmed peripheral tears with the motorized shaver.   Chondroplasty was achieved on the medial femoral condyle and medial tibial plateau using a motorized shaver to debride the grade 3 unstable cartilage. Completion of the chondroplasty left A medial femoral condyle with smooth stable surface. There was no full-thickness component noted.   Next we turned our attention to the internal fixation of the  medial tibial condyle. Arthroscopically we evaluated  the medial tibial condyle noted there was no loose cartilage or  debris surrounding the lesion and the fracture did not propagate to the joint surface.  Using preoperative MRI we targeted the delivery device to just under the subchondral density and in the subchondral edema/stress reaction region.. This was achieved with intraoperative fluoroscopy. Once accurate placement was noted on 2 views and confirmed we delivered 3 mL of flowable calcium phosphate into the medial tibial plateau stress fracture. We left the cannulas in place for approximately 8 minutes while the implant hardened. We removed the cannulas and again took 2 views of fluoroscopic pictures to confirm there was no extravasation outside of the bone. There was none noted.   After completion of synovectomy, diagnostic exam, and debridements as described, all compartments were checked and no residual debris remained. Hemostasis was achieved with the cautery wand. The portals were approximated with nylon suture. All excess fluid was expressed from the joint.  Xeroform sterile gauze dressings were applied followed by Ace bandage and ice pack.    There were no immediate competitions and all counts were correct.   DISPOSITION: The patient was awakened from general anesthetic, extubated, taken to the recovery room in medically stable condition, no apparent complications. The patient may be weightbearing as tolerated to the operative lower extremity with crutches.  Range of motion of right knee as tolerated.  They will use bid asa for DVT ppx, and return in 2 weeks for suture removal.   Nicholes Stairs

## 2020-10-05 NOTE — Brief Op Note (Signed)
10/05/2020  11:19 AM  PATIENT:  Grace Barajas  54 y.o. female  PRE-OPERATIVE DIAGNOSIS:  Left knee medial meniscus tear, tibia stress fracture  POST-OPERATIVE DIAGNOSIS:   1.  Left knee medial meniscus tear, acute. 2.  Left knee lateral meniscus tear, acute. 3.  Left knee medial tibial plateau stress fracture.  PROCEDURE:  1.  Left knee arthroscopic partial meniscectomy of the lateral and medial menisci. 2.  Left knee percutaneous fixation of medial tibial plateau stress fracture with subchondroplasty.  SURGEON:  Surgeon(s) and Role:    * Siarra Gilkerson, Elly Modena, MD - Primary  PHYSICIAN ASSISTANT: Jonelle Sidle, PA-C  ANESTHESIA:   local and general  EBL: 5 cc  BLOOD ADMINISTERED:none  DRAINS: none   LOCAL MEDICATIONS USED:  MARCAINE     SPECIMEN:  No Specimen  DISPOSITION OF SPECIMEN:  N/A  COUNTS:  YES  TOURNIQUET:  * Missing tourniquet times found for documented tourniquets in log: [id number] *  DICTATION: .Note written in EPIC  PLAN OF CARE: Discharge to home after PACU  PATIENT DISPOSITION:  PACU - hemodynamically stable.   Delay start of Pharmacological VTE agent (>24hrs) due to surgical blood loss or risk of bleeding: not applicable

## 2020-10-05 NOTE — Anesthesia Procedure Notes (Signed)
Procedure Name: Intubation Date/Time: 10/05/2020 10:39 AM Performed by: Rogers Blocker, CRNA Pre-anesthesia Checklist: Patient identified, Emergency Drugs available, Suction available and Patient being monitored Patient Re-evaluated:Patient Re-evaluated prior to induction Oxygen Delivery Method: Circle System Utilized Preoxygenation: Pre-oxygenation with 100% oxygen Induction Type: IV induction Ventilation: Mask ventilation without difficulty Laryngoscope Size: Glidescope and 3 Grade View: Grade I Tube type: Oral Number of attempts: 1 Airway Equipment and Method: Oral airway and Rigid stylet Placement Confirmation: ETT inserted through vocal cords under direct vision,  positive ETCO2 and breath sounds checked- equal and bilateral Tube secured with: Tape Dental Injury: Bloody posterior oropharynx  Difficulty Due To: Difficult Airway- due to large tongue, Difficult Airway- due to anterior larynx and Difficult Airway- due to limited oral opening Future Recommendations: Recommend- induction with short-acting agent, and alternative techniques readily available

## 2020-10-05 NOTE — H&P (Signed)
ORTHOPAEDIC H and P  REQUESTING PHYSICIAN: Nicholes Stairs, MD  PCP:  Sharion Balloon, FNP  Chief Complaint: Left knee pain  HPI: Grace Barajas is a 54 y.o. female who complains of left knee pain and mechanical symptoms.  She has failed conservative treatment over the last 11 months.  She is here today for operative intervention with left knee arthroscopy and partial medial meniscectomy as well as tibial subchondroplasty.  No new complaints today.  Past Medical History:  Diagnosis Date  . Acute medial meniscus tear of left knee 09/2019  . Allergy    SEASONAL  . Anxiety   . COVID 04-2020 or 05-2020   cough tired flu like symptoms x 7 days symptoms resolved  . Depression   . GERD (gastroesophageal reflux disease)   . Heart murmur    mild no cardiologist  . High cholesterol   . Pneumonia 2018  . Pre-diabetes   . Sinus disorder   . Sleep apnea    uses CPAP  . Sleep apnea with use of continuous positive airway pressure (CPAP)   . Snores   . Wears glasses    Past Surgical History:  Procedure Laterality Date  . BILATERAL SALPINGECTOMY Bilateral 01/21/2013   Procedure: BILATERAL SALPINGECTOMY;  Surgeon: Marvene Staff, MD;  Location: [facility];  Service: Gynecology;  Laterality: Bilateral;  . CARPAL TUNNEL RELEASE Left 07/03/2007  . CARPAL TUNNEL RELEASE Right 07/31/2007  . CERVICAL FUSION  1993   C1/C2  . COLONOSCOPY  06/26/2019  . DORSAL COMPARTMENT RELEASE Bilateral 01/13/2014   Procedure: BILATERAL 1ST DORSAL COMPARTMENT RELEASES;  Surgeon: Cammie Sickle., MD;  Location: [facility];  Service: Orthopedics;  Laterality: Bilateral;  bilateral  . left knee meniscus surgery  09/2019   [facility] [place]  . ROBOTIC ASSISTED TOTAL HYSTERECTOMY N/A 01/21/2013   Procedure: ROBOTIC ASSISTED TOTAL HYSTERECTOMY;  Surgeon: Marvene Staff, MD;  Location: [facility];  Service: Gynecology;  Laterality: N/A;  . TUBAL LIGATION  yrs ago   Social  History   Socioeconomic History  . Marital status: Single    Spouse name: Not on file  . Number of children: 3  . Years of education: Not on file  . Highest education level: Bachelor's degree (e.g., BA, AB, BS)  Occupational History  . Occupation: disabled  . Occupation: Ship broker  Tobacco Use  . Smoking status: Never Smoker  . Smokeless tobacco: Never Used  Vaping Use  . Vaping Use: Never used  Substance and Sexual Activity  . Alcohol use: Not Currently  . Drug use: No  . Sexual activity: Not on file  Other Topics Concern  . Not on file  Social History Narrative  . Not on file   Social Determinants of Health   Financial Resource Strain: Not on file  Food Insecurity: Not on file  Transportation Needs: Not on file  Physical Activity: Not on file  Stress: Not on file  Social Connections: Not on file   Family History  Problem Relation Age of Onset  . Asthma Son   . Asthma Brother   . Obesity Father   . Diabetes Father   . Heart disease Father   . Hyperlipidemia Father   . Diabetes Maternal Grandmother   . Diabetes Maternal Aunt   . Diabetes Maternal Uncle   . Colon polyps Maternal Uncle   . Thyroid disease Neg Hx   . Breast cancer Neg Hx   . Colon cancer Neg  Hx   . Esophageal cancer Neg Hx   . Rectal cancer Neg Hx   . Stomach cancer Neg Hx    Allergies  Allergen Reactions  . Morphine And Related     "Felt like an addict needing a fix."   Prior to Admission medications   Medication Sig Start Date End Date Taking? Authorizing Provider  APPLE CIDER VINEGAR PO Take by mouth.   Yes [provider]  Ascorbic Acid (VITAMIN C PO) Take by mouth daily.    Yes [provider]  Biotin 5 MG TABS Take by mouth daily.    Yes [provider]  buPROPion (WELLBUTRIN XL) 300 MG 24 hr tablet Take 300 mg by mouth daily. 01/26/18  Yes [provider]  CALCIUM PO Take by mouth daily.    Yes [provider]  cetirizine (ZYRTEC) 10 MG  chewable tablet Chew 10 mg by mouth daily.   Yes [provider]  Cholecalciferol (VITAMIN D3 PO) Take by mouth daily.    Yes [provider]  ferrous sulfate 325 (65 FE) MG tablet Take 325 mg by mouth daily with breakfast.   Yes [provider]  FLUoxetine HCl (PROZAC PO) Take 80 mg by mouth daily.   Yes [provider]  fluticasone (FLONASE) 50 MCG/ACT nasal spray Place 2 sprays into both nostrils at bedtime.    Yes [provider]  Omega-3 Fatty Acids (FISH OIL) 1000 MG CAPS Take 1 capsule by mouth daily.    Yes [provider]  QUEtiapine (SEROQUEL) 100 MG tablet Take 100 mg by mouth at bedtime.   Yes [provider]   No results found.  Positive ROS: All other systems have been reviewed and were otherwise negative with the exception of those mentioned in the HPI and as above.  Physical Exam: General: Alert, no acute distress Cardiovascular: No pedal edema Respiratory: No cyanosis, no use of accessory musculature GI: No organomegaly, abdomen is soft and non-tender Skin: No lesions in the area of chief complaint Neurologic: Sensation intact distally Psychiatric: Patient is competent for consent with normal mood and affect Lymphatic: No axillary or cervical lymphadenopathy  MUSCULOSKELETAL:  Left lower extremity is warm and well-perfused.  No lesions or open wounds.  Some small and healing superficial abrasions along the pretibial region.  Distally neurovascularly intact.  Assessment: 1.  Left knee medial meniscus tear, acute.  2.  Left knee medial tibial plateau subchondral stress fracture.  Plan: -Our plan is to proceed today with left knee arthroscopic interventions with percutaneous fixation of the medial tibial plateau stress fracture and partial medial meniscectomy of the meniscus pathology.    -We again reviewed the risk of bleeding, infection, damage to surrounding nerves and vessels, development of  progressive arthrosis, stiffness, failure of pain relief, DVT, and the risk of anesthesia.  She has provided informed consent.  -Plan for discharge home postoperatively from PACU.    Nicholes Stairs, MD Cell [phone]    10/05/2020 10:06 AM

## 2020-10-05 NOTE — Discharge Instructions (Signed)
-  You are okay for full weightbearing as tolerated to the left lower extremity.  You can also range her knee as tolerated.  -Maintain postoperative bandages for 3 days.  You may remove these on the third day and remove all dressings deep to the Ace wrap.  You can shower at that time.  Please do not submerge her incisions underwater.  Once you are done with your shower he should pat your incisions dry and cover with Band-Aids.  -Apply ice to your left knee for 30 minutes out of each hour that you are awake.  Do this around-the-clock as often as possible.  -For mild to moderate pain use Tylenol and Advil around-the-clock.  You should alternate these every 6 hours respectively.  -For breakthrough pain use oxycodone every 4 hours as necessary.  -For the prevention of blood clot she should take an 81 mg aspirin once per day for 6 weeks. = Return to see Dr. Stann Mainland in 2 weeks for routine postoperative care.   Post Anesthesia Home Care Instructions  Activity: Get plenty of rest for the remainder of the day. A responsible individual must stay with you for 24 hours following the procedure.  For the next 24 hours, DO NOT: -Drive a car -Paediatric nurse -Drink alcoholic beverages -Take any medication unless instructed by your physician -Make any legal decisions or sign important papers.  Meals: Start with liquid foods such as gelatin or soup. Progress to regular foods as tolerated. Avoid greasy, spicy, heavy foods. If nausea and/or vomiting occur, drink only clear liquids until the nausea and/or vomiting subsides. Call your physician if vomiting continues.  Special Instructions/Symptoms: Your throat may feel dry or sore from the anesthesia or the breathing tube placed in your throat during surgery. If this causes discomfort, gargle with warm salt water. The discomfort should disappear within 24 hours.

## 2020-10-05 NOTE — Anesthesia Preprocedure Evaluation (Addendum)
Anesthesia Evaluation  Patient identified by MRN, date of birth, ID band Patient awake    Reviewed: Allergy & Precautions, NPO status , Patient's Chart, lab work & pertinent test results  Airway Mallampati: III  TM Distance: >3 FB Neck ROM: Full    Dental no notable dental hx. (+) Teeth Intact, Dental Advisory Given   Pulmonary sleep apnea and Continuous Positive Airway Pressure Ventilation ,    Pulmonary exam normal breath sounds clear to auscultation       Cardiovascular Normal cardiovascular exam Rhythm:Regular Rate:Normal  HLD   Neuro/Psych PSYCHIATRIC DISORDERS Anxiety Depression Schizophrenia negative neurological ROS     GI/Hepatic Neg liver ROS, GERD  ,  Endo/Other  diabetes, Well ControlledObese BMI 35  Renal/GU negative Renal ROS  negative genitourinary   Musculoskeletal negative musculoskeletal ROS (+)   Abdominal   Peds  Hematology negative hematology ROS (+)   Anesthesia Other Findings   Reproductive/Obstetrics                            Anesthesia Physical Anesthesia Plan  ASA: III  Anesthesia Plan: General   Post-op Pain Management:    Induction: Intravenous  PONV Risk Score and Plan: 3 and Ondansetron, Dexamethasone and Midazolam  Airway Management Planned: LMA  Additional Equipment:   Intra-op Plan:   Post-operative Plan: Extubation in OR  Informed Consent: I have reviewed the patients History and Physical, chart, labs and discussed the procedure including the risks, benefits and alternatives for the proposed anesthesia with the patient or authorized representative who has indicated his/her understanding and acceptance.     Dental advisory given  Plan Discussed with: CRNA  Anesthesia Plan Comments:         Anesthesia Quick Evaluation

## 2020-10-05 NOTE — Anesthesia Postprocedure Evaluation (Signed)
Anesthesia Post Note  Patient: Grace Barajas  Procedure(s) Performed: Left knee arthroscopic partial medial meniscectomy with medial tibial subchondroplasty (Left Knee)     Patient location during evaluation: PACU Anesthesia Type: General Level of consciousness: awake and alert Pain management: pain level controlled Vital Signs Assessment: post-procedure vital signs reviewed and stable Respiratory status: spontaneous breathing, nonlabored ventilation, respiratory function stable and patient connected to nasal cannula oxygen Cardiovascular status: blood pressure returned to baseline and stable Postop Assessment: no apparent nausea or vomiting Anesthetic complications: no   No complications documented.  Last Vitals:  Vitals:   10/05/20 1300 10/05/20 1340  BP: 127/71 117/65  Pulse: 93 85  Resp: 15 15  Temp: 36.7 C 36.8 C  SpO2: 93% 94%    Last Pain:  Vitals:   10/05/20 1340  TempSrc:   PainSc: 3                  [name]

## 2020-10-06 ENCOUNTER — Encounter (HOSPITAL_BASED_OUTPATIENT_CLINIC_OR_DEPARTMENT_OTHER): Payer: Self-pay | Admitting: Orthopedic Surgery

## 2020-10-12 DIAGNOSIS — M25562 Pain in left knee: Secondary | ICD-10-CM | POA: Diagnosis not present

## 2020-10-19 DIAGNOSIS — M25562 Pain in left knee: Secondary | ICD-10-CM | POA: Diagnosis not present

## 2020-10-24 DIAGNOSIS — M25562 Pain in left knee: Secondary | ICD-10-CM | POA: Diagnosis not present

## 2020-10-26 DIAGNOSIS — M25562 Pain in left knee: Secondary | ICD-10-CM | POA: Diagnosis not present

## 2020-11-02 DIAGNOSIS — M25562 Pain in left knee: Secondary | ICD-10-CM | POA: Diagnosis not present

## 2020-11-07 DIAGNOSIS — M25562 Pain in left knee: Secondary | ICD-10-CM | POA: Diagnosis not present

## 2020-11-09 DIAGNOSIS — M25562 Pain in left knee: Secondary | ICD-10-CM | POA: Diagnosis not present

## 2020-11-16 DIAGNOSIS — M25562 Pain in left knee: Secondary | ICD-10-CM | POA: Diagnosis not present

## 2020-11-22 DIAGNOSIS — G4733 Obstructive sleep apnea (adult) (pediatric): Secondary | ICD-10-CM | POA: Diagnosis not present

## 2020-12-28 DIAGNOSIS — M25562 Pain in left knee: Secondary | ICD-10-CM | POA: Diagnosis not present

## 2021-03-08 ENCOUNTER — Encounter: Payer: Self-pay | Admitting: Family

## 2021-03-08 ENCOUNTER — Other Ambulatory Visit: Payer: Self-pay

## 2021-03-08 ENCOUNTER — Ambulatory Visit (INDEPENDENT_AMBULATORY_CARE_PROVIDER_SITE_OTHER): Payer: Medicare Other | Admitting: Family

## 2021-03-08 ENCOUNTER — Other Ambulatory Visit (HOSPITAL_COMMUNITY)
Admission: RE | Admit: 2021-03-08 | Discharge: 2021-03-08 | Disposition: A | Discharge status: Home / Self Care | Payer: Medicare Other | Source: Ambulatory Visit | Attending: Family | Admitting: Family

## 2021-03-08 VITALS — BP 120/74 | HR 82 | Temp 98.7°F | Ht 65.5 in | Wt 214.0 lb

## 2021-03-08 DIAGNOSIS — Z0001 Encounter for general adult medical examination with abnormal findings: Secondary | ICD-10-CM | POA: Diagnosis not present

## 2021-03-08 DIAGNOSIS — Z01411 Encounter for gynecological examination (general) (routine) with abnormal findings: Secondary | ICD-10-CM | POA: Diagnosis not present

## 2021-03-08 DIAGNOSIS — Z0000 Encounter for general adult medical examination without abnormal findings: Secondary | ICD-10-CM | POA: Insufficient documentation

## 2021-03-08 DIAGNOSIS — Z1151 Encounter for screening for human papillomavirus (HPV): Secondary | ICD-10-CM | POA: Diagnosis not present

## 2021-03-08 DIAGNOSIS — E785 Hyperlipidemia, unspecified: Secondary | ICD-10-CM

## 2021-03-08 DIAGNOSIS — F411 Generalized anxiety disorder: Secondary | ICD-10-CM

## 2021-03-08 DIAGNOSIS — Z1159 Encounter for screening for other viral diseases: Secondary | ICD-10-CM | POA: Diagnosis not present

## 2021-03-08 DIAGNOSIS — M25562 Pain in left knee: Secondary | ICD-10-CM

## 2021-03-08 DIAGNOSIS — Z01419 Encounter for gynecological examination (general) (routine) without abnormal findings: Secondary | ICD-10-CM

## 2021-03-08 DIAGNOSIS — F251 Schizoaffective disorder, depressive type: Secondary | ICD-10-CM

## 2021-03-08 DIAGNOSIS — F419 Anxiety disorder, unspecified: Secondary | ICD-10-CM

## 2021-03-08 DIAGNOSIS — E559 Vitamin D deficiency, unspecified: Secondary | ICD-10-CM

## 2021-03-08 DIAGNOSIS — R7303 Prediabetes: Secondary | ICD-10-CM | POA: Diagnosis not present

## 2021-03-08 DIAGNOSIS — F259 Schizoaffective disorder, unspecified: Secondary | ICD-10-CM

## 2021-03-08 DIAGNOSIS — G8929 Other chronic pain: Secondary | ICD-10-CM

## 2021-03-08 LAB — BAYER DCA HB A1C WAIVED: HB A1C (BAYER DCA - WAIVED): 6.1 % (ref <7.0)

## 2021-03-08 NOTE — Progress Notes (Signed)
Subjective:    Patient ID: Grace Barajas, female    DOB: 1966-10-03, 55 y.o.   MRN: [id number]  Chief Complaint  Patient presents with  . Annual Exam    pap   Pt presents to the office today for CPE with pap. She followed by [facility] every month for schizoaffective, anxiety, and depression. She is followed by Ortho for chronic knee pain.  Diabetes She presents for her follow-up diabetic visit. She has type 2 diabetes mellitus. Her disease course has been stable. Hypoglycemia symptoms include nervousness/anxiousness. Pertinent negatives for diabetes include no blurred vision and no foot paresthesias. Symptoms are stable. Risk factors for coronary artery disease include dyslipidemia, diabetes mellitus, hypertension and sedentary lifestyle. Her overall blood glucose range is 70-90 mg/dl. An ACE inhibitor/angiotensin II receptor blocker is being taken. Eye exam is not current.  Hyperlipidemia This is a chronic problem. The current episode started more than 1 year ago. The current treatment provides no improvement of lipids. Risk factors for coronary artery disease include dyslipidemia, diabetes mellitus, a sedentary lifestyle and post-menopausal.  Anxiety Presents for follow-up visit. Symptoms include depressed mood, excessive worry, irritability, nervous/anxious behavior and restlessness. Symptoms occur most days. The severity of symptoms is moderate.    Depression        This is a chronic problem.  The current episode started more than 1 year ago.   The onset quality is gradual.   The problem occurs intermittently.  Associated symptoms include irritable, restlessness and sad.  Associated symptoms include no helplessness and no hopelessness.  Past medical history includes anxiety.   Gynecologic Exam The patient's pertinent negatives include no genital itching, genital lesions, genital odor, vaginal bleeding or vaginal discharge. The problem occurs intermittently.    OSA  Using  CPAP most nights. Stable   Review of Systems  Constitutional: Positive for irritability.  Eyes: Negative for blurred vision.  Genitourinary: Negative for vaginal discharge.  Psychiatric/Behavioral: Positive for depression. The patient is nervous/anxious.   All other systems reviewed and are negative.  Family History  Problem Relation Age of Onset  . Asthma Son   . Asthma Brother   . Obesity Father   . Diabetes Father   . Heart disease Father   . Hyperlipidemia Father   . Diabetes Maternal Grandmother   . Diabetes Maternal Aunt   . Diabetes Maternal Uncle   . Colon polyps Maternal Uncle   . Thyroid disease Neg Hx   . Breast cancer Neg Hx   . Colon cancer Neg Hx   . Esophageal cancer Neg Hx   . Rectal cancer Neg Hx   . Stomach cancer Neg Hx    Social History   Socioeconomic History  . Marital status: Single    Spouse name: Not on file  . Number of children: 3  . Years of education: Not on file  . Highest education level: Bachelor's degree (e.g., BA, AB, BS)  Occupational History  . Occupation: disabled  . Occupation: Ship broker  Tobacco Use  . Smoking status: Never Smoker  . Smokeless tobacco: Never Used  Vaping Use  . Vaping Use: Never used  Substance and Sexual Activity  . Alcohol use: Not Currently  . Drug use: No  . Sexual activity: Not on file  Other Topics Concern  . Not on file  Social History Narrative  . Not on file   Social Determinants of Health   Financial Resource Strain: Not on file  Food Insecurity: Not on file  Transportation Needs: Not on file  Physical Activity: Not on file  Stress: Not on file  Social Connections: Not on file  Intimate Partner Violence: Not on file       Objective:   Physical Exam Vitals reviewed.  Constitutional:      General: She is irritable. She is not in acute distress.    Appearance: She is well-developed.  HENT:     Head: Normocephalic and atraumatic.     Right Ear: Tympanic membrane normal.     Left Ear:  Tympanic membrane normal.  Eyes:     Pupils: Pupils are equal, round, and reactive to light.  Neck:     Thyroid: No thyromegaly.  Cardiovascular:     Rate and Rhythm: Normal rate and regular rhythm.     Heart sounds: Normal heart sounds. No murmur heard.   Pulmonary:     Effort: Pulmonary effort is normal. No respiratory distress.     Breath sounds: Normal breath sounds. No wheezing.  Abdominal:     General: Bowel sounds are normal. There is no distension.     Palpations: Abdomen is soft.     Tenderness: There is no abdominal tenderness.  Genitourinary:    Comments: Bimanual exam- no adnexal masses or tenderness, ovaries nonpalpable   Cervix not present-  No discharge  Musculoskeletal:        General: No tenderness. Normal range of motion.     Cervical back: Normal range of motion and neck supple.  Skin:    General: Skin is warm and dry.  Neurological:     Mental Status: She is alert and oriented to person, place, and time.     Cranial Nerves: No cranial nerve deficit.     Deep Tendon Reflexes: Reflexes are normal and symmetric.  Psychiatric:        Behavior: Behavior normal.        Thought Content: Thought content normal.        Judgment: Judgment normal.       BP 120/74   Pulse 82   Temp 98.7 F (37.1 C) (Temporal)   Ht 5' 5.5" (1.664 m)   Wt 214 lb (97.1 kg)   LMP 01/11/2013   BMI 35.07 kg/m      Assessment & Plan:  Grace Barajas comes in today with chief complaint of Annual Exam (pap)   Diagnosis and orders addressed:  1. Prediabetes - CMP14+EGFR - CBC with Differential/Platelet - Bayer DCA Hb A1c Waived - Microalbumin / creatinine urine ratio  2. Vitamin D deficiency - CMP14+EGFR - CBC with Differential/Platelet - VITAMIN D 25 Hydroxy (Vit-D Deficiency, Fractures)  3. Anxiety - CMP14+EGFR - CBC with Differential/Platelet  4. GAD (generalized anxiety disorder) - CMP14+EGFR - CBC with Differential/Platelet  5. Hyperlipidemia,  unspecified hyperlipidemia type - CMP14+EGFR - CBC with Differential/Platelet - Lipid panel  6. Morbid obesity ([place]) - CMP14+EGFR - CBC with Differential/Platelet  7. Chronic pain of left knee - CMP14+EGFR - CBC with Differential/Platelet  8. Schizoaffective disorder, depressive type ([place]) - CMP14+EGFR - CBC with Differential/Platelet  9. Schizoaffective disorder, unspecified type ([place]) - CMP14+EGFR - CBC with Differential/Platelet  10. Annual physical exam - CMP14+EGFR - CBC with Differential/Platelet - Lipid panel - TSH - Hepatitis C antibody - VITAMIN D 25 Hydroxy (Vit-D Deficiency, Fractures) - Cytology - PAP - Bayer DCA Hb A1c Waived  11. Gynecologic exam normal - CMP14+EGFR - CBC with Differential/Platelet - Cytology - PAP  12. Need for hepatitis C screening test -  CMP14+EGFR - CBC with Differential/Platelet - Hepatitis C antibody   Labs pending Health Maintenance reviewed Diet and exercise encouraged  Follow up plan: 6 months   Evelina Dun, FNP

## 2021-03-08 NOTE — Patient Instructions (Signed)
Health Maintenance, Female Adopting a healthy lifestyle and getting preventive care are important in promoting health and wellness. Ask your health care provider about:  The right schedule for you to have regular tests and exams.  Things you can do on your own to prevent diseases and keep yourself healthy. What should I know about diet, weight, and exercise? Eat a healthy diet  Eat a diet that includes plenty of vegetables, fruits, low-fat dairy products, and lean protein.  Do not eat a lot of foods that are high in solid fats, added sugars, or sodium.   Maintain a healthy weight Body mass index (BMI) is used to identify weight problems. It estimates body fat based on height and weight. Your health care provider can help determine your BMI and help you achieve or maintain a healthy weight. Get regular exercise Get regular exercise. This is one of the most important things you can do for your health. Most adults should:  Exercise for at least 150 minutes each week. The exercise should increase your heart rate and make you sweat (moderate-intensity exercise).  Do strengthening exercises at least twice a week. This is in addition to the moderate-intensity exercise.  Spend less time sitting. Even light physical activity can be beneficial. Watch cholesterol and blood lipids Have your blood tested for lipids and cholesterol at 56 years of age, then have this test every 5 years. Have your cholesterol levels checked more often if:  Your lipid or cholesterol levels are high.  You are older than 56 years of age.  You are at high risk for heart disease. What should I know about cancer screening? Depending on your health history and family history, you may need to have cancer screening at various ages. This may include screening for:  Breast cancer.  Cervical cancer.  Colorectal cancer.  Skin cancer.  Lung cancer. What should I know about heart disease, diabetes, and high blood  pressure? Blood pressure and heart disease  High blood pressure causes heart disease and increases the risk of stroke. This is more likely to develop in people who have high blood pressure readings, are of African descent, or are overweight.  Have your blood pressure checked: ? Every 3-5 years if you are 18-39 years of age. ? Every year if you are 40 years old or older. Diabetes Have regular diabetes screenings. This checks your fasting blood sugar level. Have the screening done:  Once every three years after age 40 if you are at a normal weight and have a low risk for diabetes.  More often and at a younger age if you are overweight or have a high risk for diabetes. What should I know about preventing infection? Hepatitis B If you have a higher risk for hepatitis B, you should be screened for this virus. Talk with your health care provider to find out if you are at risk for hepatitis B infection. Hepatitis C Testing is recommended for:  Everyone born from 1945 through 1965.  Anyone with known risk factors for hepatitis C. Sexually transmitted infections (STIs)  Get screened for STIs, including gonorrhea and chlamydia, if: ? You are sexually active and are younger than 56 years of age. ? You are older than 56 years of age and your health care provider tells you that you are at risk for this type of infection. ? Your sexual activity has changed since you were last screened, and you are at increased risk for chlamydia or gonorrhea. Ask your health care provider   if you are at risk.  Ask your health care provider about whether you are at high risk for HIV. Your health care provider may recommend a prescription medicine to help prevent HIV infection. If you choose to take medicine to prevent HIV, you should first get tested for HIV. You should then be tested every 3 months for as long as you are taking the medicine. Pregnancy  If you are about to stop having your period (premenopausal) and  you may become pregnant, seek counseling before you get pregnant.  Take 400 to 800 micrograms (mcg) of folic acid every day if you become pregnant.  Ask for birth control (contraception) if you want to prevent pregnancy. Osteoporosis and menopause Osteoporosis is a disease in which the bones lose minerals and strength with aging. This can result in bone fractures. If you are 65 years old or older, or if you are at risk for osteoporosis and fractures, ask your health care provider if you should:  Be screened for bone loss.  Take a calcium or vitamin D supplement to lower your risk of fractures.  Be given hormone replacement therapy (HRT) to treat symptoms of menopause. Follow these instructions at home: Lifestyle  Do not use any products that contain nicotine or tobacco, such as cigarettes, e-cigarettes, and chewing tobacco. If you need help quitting, ask your health care provider.  Do not use street drugs.  Do not share needles.  Ask your health care provider for help if you need support or information about quitting drugs. Alcohol use  Do not drink alcohol if: ? Your health care provider tells you not to drink. ? You are pregnant, may be pregnant, or are planning to become pregnant.  If you drink alcohol: ? Limit how much you use to 0-1 drink a day. ? Limit intake if you are breastfeeding.  Be aware of how much alcohol is in your drink. In the [place] one drink equals one 12 oz bottle of beer (355 mL), one 5 oz glass of wine (148 mL), or one 1 oz glass of hard liquor (44 mL). General instructions  Schedule regular health, dental, and eye exams.  Stay current with your vaccines.  Tell your health care provider if: ? You often feel depressed. ? You have ever been abused or do not feel safe at home. Summary  Adopting a healthy lifestyle and getting preventive care are important in promoting health and wellness.  Follow your health care provider's instructions about healthy  diet, exercising, and getting tested or screened for diseases.  Follow your health care provider's instructions on monitoring your cholesterol and blood pressure. This information is not intended to replace advice given to you by your health care provider. Make sure you discuss any questions you have with your health care provider. Document Revised: 10/07/2018 Document Reviewed: 10/07/2018 Elsevier Patient Education  2021 [employer]vier Inc.  

## 2021-03-09 LAB — CMP14+EGFR
ALT: 14 IU/L (ref 0–32)
AST: 8 IU/L (ref 0–40)
Albumin/Globulin Ratio: 1.5 (ref 1.2–2.2)
Albumin: 4.3 g/dL (ref 3.8–4.9)
Alkaline Phosphatase: 99 IU/L (ref 44–121)
BUN/Creatinine Ratio: 21 (ref 9–23)
BUN: 12 mg/dL (ref 6–24)
Bilirubin Total: 0.3 mg/dL (ref 0.0–1.2)
CO2: 23 mmol/L (ref 20–29)
Calcium: 10.1 mg/dL (ref 8.7–10.2)
Chloride: 103 mmol/L (ref 96–106)
Creatinine, Ser: 0.56 mg/dL — ABNORMAL LOW (ref 0.57–1.00)
Globulin, Total: 2.8 g/dL (ref 1.5–4.5)
Glucose: 117 mg/dL — ABNORMAL HIGH (ref 65–99)
Potassium: 4.1 mmol/L (ref 3.5–5.2)
Sodium: 142 mmol/L (ref 134–144)
Total Protein: 7.1 g/dL (ref 6.0–8.5)
eGFR: 108 mL/min/{1.73_m2} (ref >59)

## 2021-03-09 LAB — CBC WITH DIFFERENTIAL/PLATELET
Basophils Absolute: 0 10*3/uL (ref 0.0–0.2)
Basos: 1 %
EOS (ABSOLUTE): 0.1 10*3/uL (ref 0.0–0.4)
Eos: 3 %
Hematocrit: 35.1 % (ref 34.0–46.6)
Hemoglobin: 11.8 g/dL (ref 11.1–15.9)
Immature Grans (Abs): 0 10*3/uL (ref 0.0–0.1)
Immature Granulocytes: 1 %
Lymphocytes Absolute: 1.8 10*3/uL (ref 0.7–3.1)
Lymphs: 44 %
MCH: 29.1 pg (ref 26.6–33.0)
MCHC: 33.6 g/dL (ref 31.5–35.7)
MCV: 87 fL (ref 79–97)
Monocytes Absolute: 0.3 10*3/uL (ref 0.1–0.9)
Monocytes: 7 %
Neutrophils Absolute: 1.8 10*3/uL (ref 1.4–7.0)
Neutrophils: 44 %
Platelets: 382 10*3/uL (ref 150–450)
RBC: 4.06 x10E6/uL (ref 3.77–5.28)
RDW: 13.6 % (ref 11.7–15.4)
WBC: 4 10*3/uL (ref 3.4–10.8)

## 2021-03-09 LAB — TSH: TSH: 1.7 u[IU]/mL (ref 0.450–4.500)

## 2021-03-09 LAB — MICROALBUMIN / CREATININE URINE RATIO
Creatinine, Urine: 135.9 mg/dL
Microalb/Creat Ratio: 8 mg/g creat (ref 0–29)
Microalbumin, Urine: 11 ug/mL

## 2021-03-09 LAB — LIPID PANEL
Chol/HDL Ratio: 4.7 ratio — ABNORMAL HIGH (ref 0.0–4.4)
Cholesterol, Total: 213 mg/dL — ABNORMAL HIGH (ref 100–199)
HDL: 45 mg/dL (ref >39)
LDL Chol Calc (NIH): 135 mg/dL — ABNORMAL HIGH (ref 0–99)
Triglycerides: 183 mg/dL — ABNORMAL HIGH (ref 0–149)
VLDL Cholesterol Cal: 33 mg/dL (ref 5–40)

## 2021-03-09 LAB — HEPATITIS C ANTIBODY: Hep C Virus Ab: <0.1 s/co ratio (ref 0.0–0.9)

## 2021-03-09 LAB — VITAMIN D 25 HYDROXY (VIT D DEFICIENCY, FRACTURES): Vit D, 25-Hydroxy: 34.5 ng/mL (ref 30.0–100.0)

## 2021-03-12 ENCOUNTER — Other Ambulatory Visit: Payer: Self-pay | Admitting: Family

## 2021-03-12 MED ORDER — ROSUVASTATIN CALCIUM 5 MG PO TABS: 5.0000 mg | ORAL_TABLET | Freq: Every day | ORAL | 3 refills | Status: DC

## 2021-03-15 LAB — CYTOLOGY - PAP
Comment: NEGATIVE
Diagnosis: NEGATIVE
High risk HPV: NEGATIVE

## 2021-04-06 ENCOUNTER — Telehealth: Payer: Self-pay | Admitting: Pulmonary Disease

## 2021-04-06 DIAGNOSIS — R911 Solitary pulmonary nodule: Secondary | ICD-10-CM

## 2021-04-09 NOTE — Telephone Encounter (Signed)
Pt returned call to office. Pt is inquiring if she needs to have a PET scan to follow pulmonary nodule. Dr. Elsworth Soho please advise. Thank you

## 2021-04-09 NOTE — Telephone Encounter (Signed)
Left message for patient to call back  

## 2021-04-09 NOTE — Telephone Encounter (Signed)
There is no order for a PET scan last one ordered was 02/2020 and it was denied

## 2021-04-11 NOTE — Telephone Encounter (Signed)
Spoke with patient who was calling back about getting scan scheduled as well as OV after scan with Dr. Elsworth Soho. Order has been placed for CT scan and she is now scheduled with Dr. Elsworth Soho on 05/16/21. Advised her scan would be scheduled to be done before that visit so they would have the results to go over at her OV. Patient expressed understanding. Nothing further needed at this time.

## 2021-04-11 NOTE — Telephone Encounter (Signed)
Called and spoke with patient to let her know that the PET from May 2021 was denied and that the message we received from Dr. Elsworth Soho said CT scan so that's what was ordered. Informed her that if the CT scan showed anything then we would possibly order a PET scan. Patient expressed understanding. Nothing further needed at this time.

## 2021-04-11 NOTE — Telephone Encounter (Signed)
Called to give pt CT info- 6/25 @ 9:15am @ [facility]. Pt was told that a PET was denied and wanted to know why she couldn't get a PET vs a CT but no new PET scan was order. Please advise.

## 2021-04-11 NOTE — Telephone Encounter (Signed)
ATC patient to go over recs with patient from Dr. Elsworth Soho. LMTCB Order for CT scan has been placed.  When patient calls back she needs to either be scheduled with Dr. Elsworth Soho after scan looks like his first avail is 05/16/21 or with APP.

## 2021-04-21 ENCOUNTER — Other Ambulatory Visit: Payer: Self-pay

## 2021-04-21 ENCOUNTER — Ambulatory Visit
Admission: RE | Admit: 2021-04-21 | Discharge: 2021-04-21 | Disposition: A | Discharge status: Home / Self Care | Payer: Medicare Other | Source: Ambulatory Visit | Attending: Pulmonary Disease | Admitting: Pulmonary Disease

## 2021-04-21 DIAGNOSIS — R918 Other nonspecific abnormal finding of lung field: Secondary | ICD-10-CM | POA: Diagnosis not present

## 2021-04-21 DIAGNOSIS — R911 Solitary pulmonary nodule: Secondary | ICD-10-CM

## 2021-04-24 ENCOUNTER — Telehealth: Payer: Self-pay | Admitting: Pulmonary Disease

## 2021-04-24 NOTE — Telephone Encounter (Signed)
Rigoberto Noel, MD  04/24/2021  9:57 AM EDT      Very minimal growth from 16 to 17 mm over 1 year We will discuss plan on follow-up office visit next month  Called and spoke with patient. She was made of results and verbalized understanding. Reminded her of her appt next month.   Nothing further needed at time of call.

## 2021-04-24 NOTE — Progress Notes (Signed)
LMTCB for pt 

## 2021-04-27 DIAGNOSIS — M1712 Unilateral primary osteoarthritis, left knee: Secondary | ICD-10-CM | POA: Diagnosis not present

## 2021-05-11 DIAGNOSIS — R918 Other nonspecific abnormal finding of lung field: Secondary | ICD-10-CM | POA: Diagnosis not present

## 2021-05-11 DIAGNOSIS — E782 Mixed hyperlipidemia: Secondary | ICD-10-CM | POA: Diagnosis not present

## 2021-05-11 DIAGNOSIS — Z9989 Dependence on other enabling machines and devices: Secondary | ICD-10-CM | POA: Diagnosis not present

## 2021-05-11 DIAGNOSIS — M25569 Pain in unspecified knee: Secondary | ICD-10-CM | POA: Diagnosis not present

## 2021-05-11 DIAGNOSIS — G4733 Obstructive sleep apnea (adult) (pediatric): Secondary | ICD-10-CM | POA: Diagnosis not present

## 2021-05-11 DIAGNOSIS — I288 Other diseases of pulmonary vessels: Secondary | ICD-10-CM | POA: Diagnosis not present

## 2021-05-11 DIAGNOSIS — I700 Atherosclerosis of aorta: Secondary | ICD-10-CM | POA: Diagnosis not present

## 2021-05-14 DIAGNOSIS — I288 Other diseases of pulmonary vessels: Secondary | ICD-10-CM | POA: Diagnosis not present

## 2021-05-14 DIAGNOSIS — E782 Mixed hyperlipidemia: Secondary | ICD-10-CM | POA: Diagnosis not present

## 2021-05-16 ENCOUNTER — Other Ambulatory Visit: Payer: Self-pay

## 2021-05-16 ENCOUNTER — Ambulatory Visit: Payer: Medicare Other | Admitting: Pulmonary Disease

## 2021-05-16 ENCOUNTER — Encounter: Payer: Self-pay | Admitting: Pulmonary Disease

## 2021-05-16 VITALS — BP 118/74 | HR 81 | Ht 65.5 in | Wt 208.0 lb

## 2021-05-16 DIAGNOSIS — E042 Nontoxic multinodular goiter: Secondary | ICD-10-CM | POA: Diagnosis not present

## 2021-05-16 DIAGNOSIS — G4733 Obstructive sleep apnea (adult) (pediatric): Secondary | ICD-10-CM

## 2021-05-16 DIAGNOSIS — R911 Solitary pulmonary nodule: Secondary | ICD-10-CM | POA: Diagnosis not present

## 2021-05-16 NOTE — Progress Notes (Signed)
   Subjective:    Patient ID: Grace Barajas, female    DOB: November 08, 1965, 55 y.o.   MRN: [id number]  HPI  55 yo never smoker for follow-up of moderate OSA and right lower lobe nodule    8mm RLL nodule Noted incidentally on CT scan when she had community-acquired pneumonia in 2018 This has gradually grown in size dotatate PET scan was turned down by insurance 2021 in spite of peer-to-peer review, TCTS recommended resection We reviewed annual follow-up CT chest today. She also has multinodular goiter which is being followed up annually, denies shortness of breath Increased pulmonary artery size suggestive of pulm hypertension was noted on CT and I reviewed cardiology consultation [[facility]] -echo has been ordered  She feels like there is something wrong and the CPAP report, she reports using this every night and denies any problems with mask or pressure  Significant tests/ events reviewed CT chest wo con 03/2021 >> Minimal progression of 1.7 x 1.3 cm (previously 1.6 x 1.2 cm) retro hilar RLL nodule  PET 09/2019 1.7 x 1.2 cm , negative CT chest 05/2019 Pulmonary nodule in the central right lower lobe measures 1.6 x 1.3 by 1.3 cm (volume = 1.4 cm^3), image 83/3 and image 85/5. In 2019 measured 1.5 x 0.9 by 1.2 cm (volume = 0.8 cm^3). On 03/18/2017 this nodule measured 1.1 x 0.9 by 0.9 cm    US thyroid 09/2018 >> multinodular goiter. No definitive worrisomenew or enlarging thyroid nodules.  Previously biopsied nodule within the right lobe of the thyroid (labeled 2), is grossly unchanged compared to the 11/2016 examination Left mid nodule >> 1 yr FU advised     10/2016-CT angiogram showed  a right lower lobe 10 mm nodule. There were 2 thyroid nodules 10 mm calcified left lobe and 8 mm noncalcified left lobe and possible adrenal nodule was noted measuring 1.1 cm in the left.   NPSG 06/2014:  AHI 15/hr, desat 89% 06/2015 AHI 16   Spirometry 08/2016 was a poor effort but showed normal lung  function without airway obstruction  Review of Systems neg for any significant sore throat, dysphagia, itching, sneezing, nasal congestion or excess/ purulent secretions, fever, chills, sweats, unintended wt loss, pleuritic or exertional cp, hempoptysis, orthopnea pnd or change in chronic leg swelling. Also denies presyncope, palpitations, heartburn, abdominal pain, nausea, vomiting, diarrhea or change in bowel or urinary habits, dysuria,hematuria, rash, arthralgias, visual complaints, headache, numbness weakness or ataxia.     Objective:   Physical Exam  Gen. Pleasant, obese, in no distress ENT - no lesions, no post nasal drip Neck: No JVD, no thyromegaly, no carotid bruits Lungs: no use of accessory muscles, no dullness to percussion, decreased without rales or rhonchi  Cardiovascular: Rhythm regular, heart sounds  normal, no murmurs or gallops, no peripheral edema Musculoskeletal: No deformities, no cyanosis or clubbing , no tremors       Assessment & Plan:

## 2021-05-16 NOTE — Patient Instructions (Signed)
  [place] of thyroid & referral to endocrine CT chest Wo con in June 2023

## 2021-05-17 NOTE — Assessment & Plan Note (Signed)
CPAP download was reviewed which shows sporadic compliance, average pressure of 10 to 11 cm on auto CPAP settings with good control of events when CPAP is used I reviewed this report with her and encouraged compliance

## 2021-05-17 NOTE — Assessment & Plan Note (Signed)
We will schedule annual follow-up ultrasound of thyroid and refer her to endocrine for follow-up of thyroid nodules in the future

## 2021-05-17 NOTE — Assessment & Plan Note (Signed)
PET negative nodule has slowly grown in size from 10 mm to 17 mm, with 1 mm growth over the last year.  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  Very low possibility of this turning malignant, as noted on previous TCD's consultation which I reviewed, prefer to proceed directly to resection if intervention desired.  Doubt biopsy would add to diagnosis.  She prefers to hold off for another year and will schedule follow-up CT chest in 1 years time

## 2021-05-23 ENCOUNTER — Ambulatory Visit
Admission: RE | Admit: 2021-05-23 | Discharge: 2021-05-23 | Disposition: A | Discharge status: Home / Self Care | Payer: Medicare Other | Source: Ambulatory Visit | Attending: Pulmonary Disease | Admitting: Pulmonary Disease

## 2021-05-23 DIAGNOSIS — R911 Solitary pulmonary nodule: Secondary | ICD-10-CM

## 2021-05-23 DIAGNOSIS — E042 Nontoxic multinodular goiter: Secondary | ICD-10-CM

## 2021-05-23 DIAGNOSIS — E041 Nontoxic single thyroid nodule: Secondary | ICD-10-CM | POA: Diagnosis not present

## 2021-05-24 ENCOUNTER — Other Ambulatory Visit: Payer: Self-pay

## 2021-05-24 DIAGNOSIS — E042 Nontoxic multinodular goiter: Secondary | ICD-10-CM

## 2021-05-24 NOTE — Addendum Note (Signed)
Addended by: Elby Beck R on: 05/24/2021 04:22 PM   Modules accepted: Orders

## 2021-06-04 ENCOUNTER — Encounter: Payer: Self-pay | Admitting: Endocrinology

## 2021-06-13 DIAGNOSIS — M1712 Unilateral primary osteoarthritis, left knee: Secondary | ICD-10-CM | POA: Diagnosis not present

## 2021-06-15 DIAGNOSIS — I361 Nonrheumatic tricuspid (valve) insufficiency: Secondary | ICD-10-CM | POA: Diagnosis not present

## 2021-06-15 DIAGNOSIS — I288 Other diseases of pulmonary vessels: Secondary | ICD-10-CM | POA: Diagnosis not present

## 2021-06-15 DIAGNOSIS — E782 Mixed hyperlipidemia: Secondary | ICD-10-CM | POA: Diagnosis not present

## 2021-06-15 DIAGNOSIS — I071 Rheumatic tricuspid insufficiency: Secondary | ICD-10-CM | POA: Diagnosis not present

## 2021-06-15 DIAGNOSIS — I700 Atherosclerosis of aorta: Secondary | ICD-10-CM | POA: Diagnosis not present

## 2021-06-20 DIAGNOSIS — M1712 Unilateral primary osteoarthritis, left knee: Secondary | ICD-10-CM | POA: Diagnosis not present

## 2021-06-27 DIAGNOSIS — M1712 Unilateral primary osteoarthritis, left knee: Secondary | ICD-10-CM | POA: Diagnosis not present

## 2021-07-09 DIAGNOSIS — G4733 Obstructive sleep apnea (adult) (pediatric): Secondary | ICD-10-CM | POA: Diagnosis not present

## 2021-07-30 ENCOUNTER — Ambulatory Visit: Payer: Medicare Other | Admitting: Endocrinology

## 2021-08-21 ENCOUNTER — Telehealth: Payer: Self-pay | Admitting: Family

## 2021-08-21 NOTE — Telephone Encounter (Signed)
LVM for pt to rtn my call to schedule AWV with [facility].  

## 2021-08-23 ENCOUNTER — Ambulatory Visit (INDEPENDENT_AMBULATORY_CARE_PROVIDER_SITE_OTHER): Payer: Medicare Other | Admitting: Endocrinology

## 2021-08-23 ENCOUNTER — Other Ambulatory Visit: Payer: Self-pay

## 2021-08-23 VITALS — BP 140/78 | HR 84 | Ht 65.5 in | Wt 214.6 lb

## 2021-08-23 DIAGNOSIS — E042 Nontoxic multinodular goiter: Secondary | ICD-10-CM | POA: Diagnosis not present

## 2021-08-23 NOTE — Patient Instructions (Signed)
No further thyroid testing or any medication is needed now. Please come back for a follow-up appointment in 8 months.  most of the time, a "lumpy thyroid" will eventually become overactive.  this is usually a slow process, happening over the span of many years.

## 2021-08-23 NOTE — Progress Notes (Signed)
Subjective:    Patient ID: Grace Barajas, female    DOB: 13-Nov-1965, 55 y.o.   MRN: [id number]  HPI Pt is referred by Evelina Dun, for nodular thyroid.  Pt was noted to have a thyroid nodule in 2018.  She had bx then.  she has no h/o XRT or surgery to the neck.  She has intermitt neck pain.   Past Medical History:  Diagnosis Date   Acute medial meniscus tear of left knee 09/2019   Allergy    SEASONAL   Anxiety    COVID 04-2020 or 05-2020   cough tired flu like symptoms x 7 days symptoms resolved   Depression    GERD (gastroesophageal reflux disease)    Heart murmur    mild no cardiologist   High cholesterol    Pneumonia 2018   Pre-diabetes    Sinus disorder    Sleep apnea    uses CPAP   Sleep apnea with use of continuous positive airway pressure (CPAP)    Snores    Wears glasses     Past Surgical History:  Procedure Laterality Date   BILATERAL SALPINGECTOMY Bilateral 01/21/2013   Procedure: BILATERAL SALPINGECTOMY;  Surgeon: Marvene Staff, MD;  Location: [facility];  Service: Gynecology;  Laterality: Bilateral;   CARPAL TUNNEL RELEASE Left 07/03/2007   CARPAL TUNNEL RELEASE Right 07/31/2007   CERVICAL FUSION  1993   C1/C2   COLONOSCOPY  06/26/2019   DORSAL COMPARTMENT RELEASE Bilateral 01/13/2014   Procedure: BILATERAL 1ST DORSAL COMPARTMENT RELEASES;  Surgeon: Cammie Sickle., MD;  Location: [facility];  Service: Orthopedics;  Laterality: Bilateral;  bilateral   JOINT REPLACEMENT     KNEE ARTHROSCOPY WITH SUBCHONDROPLASTY Left 10/05/2020   Procedure: Left knee arthroscopic partial medial meniscectomy with medial tibial subchondroplasty;  Surgeon: Nicholes Stairs, MD;  Location: [facility];  Service: Orthopedics;  Laterality: Left;  75 mins   left knee meniscus surgery  09/2019   [facility] [place]   ROBOTIC ASSISTED TOTAL HYSTERECTOMY N/A 01/21/2013   Procedure: ROBOTIC ASSISTED TOTAL HYSTERECTOMY;  Surgeon:  Marvene Staff, MD;  Location: [facility];  Service: Gynecology;  Laterality: N/A;   TUBAL LIGATION  yrs ago    Social History   Socioeconomic History   Marital status: Single    Spouse name: Not on file   Number of children: 3   Years of education: Not on file   Highest education level: Bachelor's degree (e.g., BA, AB, BS)  Occupational History   Occupation: disabled   Occupation: Ship broker  Tobacco Use   Smoking status: Never   Smokeless tobacco: Never  Vaping Use   Vaping Use: Never used  Substance and Sexual Activity   Alcohol use: Not Currently   Drug use: No   Sexual activity: Not on file  Other Topics Concern   Not on file  Social History Narrative   Not on file   Social Determinants of Health   Financial Resource Strain: Not on file  Food Insecurity: Not on file  Transportation Needs: Not on file  Physical Activity: Not on file  Stress: Not on file  Social Connections: Not on file  Intimate Partner Violence: Not on file    Current Outpatient Medications on File Prior to Visit  Medication Sig Dispense Refill   Ascorbic Acid (VITAMIN C PO) Take by mouth daily.      Biotin 5 MG TABS Take by mouth daily.  buPROPion (WELLBUTRIN XL) 300 MG 24 hr tablet Take 300 mg by mouth daily.     CALCIUM PO Take by mouth daily.      cetirizine (ZYRTEC) 10 MG chewable tablet Chew 10 mg by mouth daily.     Cholecalciferol (VITAMIN D3 PO) Take by mouth daily.      ferrous sulfate 325 (65 FE) MG tablet Take 325 mg by mouth daily with breakfast.     FLUoxetine HCl (PROZAC PO) Take 80 mg by mouth daily.     fluticasone (FLONASE) 50 MCG/ACT nasal spray Place 2 sprays into both nostrils at bedtime.      Omega-3 Fatty Acids (FISH OIL) 1000 MG CAPS Take 1 capsule by mouth daily.      rosuvastatin (CRESTOR) 5 MG tablet Take 1 tablet (5 mg total) by mouth daily. 90 tablet 3   No current facility-administered medications on file prior to visit.    Allergies  Allergen Reactions    Morphine And Related     "Felt like an addict needing a fix."    Family History  Problem Relation Age of Onset   Asthma Son    Asthma Brother    Obesity Father    Diabetes Father    Heart disease Father    Hyperlipidemia Father    Diabetes Maternal Grandmother    Diabetes Maternal Aunt    Diabetes Maternal Uncle    Colon polyps Maternal Uncle    Thyroid disease Neg Hx    Breast cancer Neg Hx    Colon cancer Neg Hx    Esophageal cancer Neg Hx    Rectal cancer Neg Hx    Stomach cancer Neg Hx     BP 140/78 (BP Location: Right Arm, Patient Position: Sitting, Cuff Size: Normal)   Pulse 84   Ht 5' 5.5" (1.664 m)   Wt 214 lb 9.6 oz (97.3 kg)   LMP 01/11/2013   SpO2 96%   BMI 35.17 kg/m    Review of Systems Denies hoarseness and sob.      Objective:   Physical Exam VITAL SIGNS:  See vs page GENERAL: no distress NECK: I do not appreciate a nodule in the thyroid or elsewhere in the neck   Lab Results  Component Value Date   TSH 1.700 03/08/2021   T4TOTAL 7.9 02/27/2017   [place] (2022) No significant interval change in the size or appearance of the previously biopsied nodule/cluster of nodules in the right mid gland. 2. 4 year stability of peripherally calcified nodule in the right mid gland strongly suggestive of benignity. One additional follow-up ultrasound in 1 year is required to confirm 5 year stability. 3. Approximately 1.4 cm TI-RADS category 4 nodule in the left inferior gland (labeled # 6 above) may be slightly larger than.   previously seen and continues to meet imaging criteria for annual surveillance. Recommend attention on 1 year follow-up ultrasound.  Bx right nodule (7353) FOLLICULAR NODULE ([place] CATEGORY II).   I have reviewed outside records, and summarized: Pt was noted to have MNG, and referred here.  I last saw this pt in 2018, and I ordered bx    Assessment & Plan:  MNG.  We discussed need for f/u  Patient Instructions  No further  thyroid testing or any medication is needed now. Please come back for a follow-up appointment in 8 months.  most of the time, a "lumpy thyroid" will eventually become overactive.  this is usually a slow process, happening over the span  of many years.

## 2021-10-15 ENCOUNTER — Telehealth: Payer: Self-pay | Admitting: Family

## 2021-10-26 ENCOUNTER — Ambulatory Visit (INDEPENDENT_AMBULATORY_CARE_PROVIDER_SITE_OTHER): Payer: Medicare Other

## 2021-10-26 DIAGNOSIS — Z0000 Encounter for general adult medical examination without abnormal findings: Secondary | ICD-10-CM

## 2021-10-26 NOTE — Progress Notes (Signed)
MEDICARE ANNUAL WELLNESS VISIT  10/26/2021  Telephone Visit Disclaimer This Medicare AWV was conducted by telephone due to national recommendations for restrictions regarding the COVID-19 Pandemic (e.g. social distancing).  I verified, using two identifiers, that I am speaking with Grace Barajas or their authorized healthcare agent. I discussed the limitations, risks, security, and privacy concerns of performing an evaluation and management service by telephone and the potential availability of an in-person appointment in the future. The patient expressed understanding and agreed to proceed.  Location of Patient: Home Location of Provider (nurse):  [facility]  Subjective:    Grace Barajas is a 55 y.o. female patient of Hawks, Theador Hawthorne, FNP who had a Medicare Annual Wellness Visit today via telephone. Grace Barajas is Disabled and lives with their family. she has three children. she reports that she is socially active and does interact with friends/family regularly. she is not physically active.  Patient Care Team: Sharion Balloon, FNP as PCP - General (Nurse Practitioner) Rigoberto Noel, MD as Consulting Physician (Pulmonary Disease) Marilynne Halsted, MD as Referring Physician (Ophthalmology) Wendie Chess, Chrystie Nose, MD (Otolaryngology) Antonieta Pert, MD as Referring Physician (Psychiatry)  Advanced Directives 10/26/2021 10/05/2020 10/01/2019 09/20/2019 02/11/2018 11/27/2016 11/10/2016  Does Patient Have a Medical Advance Directive? Yes Yes Yes Yes No Yes Yes  Type of Advance Directive Living will [place];Living will - [place];Living will -  Does patient want to make changes to medical advance directive? - No - Patient declined No - Patient declined - - - -  Copy of [place] in Chart? - No - copy requested - No - copy requested - No - copy requested -  Would  patient like information on creating a medical advance directive? No - Patient declined - - - No - Patient declined - -  Pre-existing out of facility DNR order (yellow form or pink MOST form) - - - - - - -    Hospital Utilization Over the Past 12 Months: # of hospitalizations or ER visits: 0 # of surgeries: 1  Review of Systems    Patient reports that her overall health is unchanged compared to last year.  Negative except    Patient Reported Readings (BP, Pulse, CBG, Weight, etc) none  Pain Assessment Pain : 0-10 Pain Score: 8  Pain Type: Chronic pain Pain Location: Knee Pain Orientation: Left Pain Descriptors / Indicators: Aching Pain Onset: More than a month ago Pain Frequency: Constant     Current Medications & Allergies (verified) Allergies as of 10/26/2021       Reactions   Morphine And Related    "Felt like an addict needing a fix."        Medication List        Accurate as of October 26, 2021  2:21 PM. If you have any questions, ask your nurse or doctor.          Biotin 5 MG Tabs Take by mouth daily.   buPROPion 300 MG 24 hr tablet Commonly known as: WELLBUTRIN XL Take 300 mg by mouth daily.   CALCIUM PO Take by mouth daily.   cetirizine 10 MG chewable tablet Commonly known as: ZYRTEC Chew 10 mg by mouth daily.   ferrous sulfate 325 (65 FE) MG tablet Take 325 mg by mouth daily with breakfast.   Fish Oil 1000 MG Caps Take 1 capsule by mouth daily.  fluticasone 50 MCG/ACT nasal spray Commonly known as: FLONASE Place 2 sprays into both nostrils at bedtime.   guaiFENesin 200 MG tablet Take 200 mg by mouth every 4 (four) hours as needed for cough or to loosen phlegm.   GuanFACINE HCl 3 MG Tb24 Take 1 tablet by mouth daily.   metFORMIN 500 MG tablet Commonly known as: GLUCOPHAGE Take by mouth daily.   PROZAC PO Take 80 mg by mouth daily.   QUEtiapine 100 MG tablet Commonly known as: SEROQUEL Take 100 mg by mouth at bedtime.    rosuvastatin 5 MG tablet Commonly known as: Crestor Take 1 tablet (5 mg total) by mouth daily.   VITAMIN C PO Take by mouth daily.   VITAMIN D3 PO Take by mouth daily.        History (reviewed): Past Medical History:  Diagnosis Date   Acute medial meniscus tear of left knee 09/2019   Allergy    SEASONAL   Anxiety    COVID 04-2020 or 05-2020   cough tired flu like symptoms x 7 days symptoms resolved   Depression    GERD (gastroesophageal reflux disease)    Heart murmur    mild no cardiologist   High cholesterol    Pneumonia 2018   Pre-diabetes    Sinus disorder    Sleep apnea    uses CPAP   Sleep apnea with use of continuous positive airway pressure (CPAP)    Snores    Wears glasses    Past Surgical History:  Procedure Laterality Date   BILATERAL SALPINGECTOMY Bilateral 01/21/2013   Procedure: BILATERAL SALPINGECTOMY;  Surgeon: Marvene Staff, MD;  Location: [facility];  Service: Gynecology;  Laterality: Bilateral;   CARPAL TUNNEL RELEASE Left 07/03/2007   CARPAL TUNNEL RELEASE Right 07/31/2007   CERVICAL FUSION  1993   C1/C2   COLONOSCOPY  06/26/2019   DORSAL COMPARTMENT RELEASE Bilateral 01/13/2014   Procedure: BILATERAL 1ST DORSAL COMPARTMENT RELEASES;  Surgeon: Cammie Sickle., MD;  Location: [facility];  Service: Orthopedics;  Laterality: Bilateral;  bilateral   JOINT REPLACEMENT     KNEE ARTHROSCOPY WITH SUBCHONDROPLASTY Left 10/05/2020   Procedure: Left knee arthroscopic partial medial meniscectomy with medial tibial subchondroplasty;  Surgeon: Nicholes Stairs, MD;  Location: [facility];  Service: Orthopedics;  Laterality: Left;  75 mins   left knee meniscus surgery  09/2019   surgical center of [place]   ROBOTIC ASSISTED TOTAL HYSTERECTOMY N/A 01/21/2013   Procedure: ROBOTIC ASSISTED TOTAL HYSTERECTOMY;  Surgeon: Marvene Staff, MD;  Location: [facility];  Service: Gynecology;  Laterality: N/A;   TUBAL LIGATION   yrs ago   Family History  Problem Relation Age of Onset   Asthma Son    Asthma Brother    Obesity Father    Diabetes Father    Heart disease Father    Hyperlipidemia Father    Diabetes Maternal Grandmother    Diabetes Maternal Aunt    Diabetes Maternal Uncle    Colon polyps Maternal Uncle    Thyroid disease Neg Hx    Breast cancer Neg Hx    Colon cancer Neg Hx    Esophageal cancer Neg Hx    Rectal cancer Neg Hx    Stomach cancer Neg Hx    Social History   Socioeconomic History   Marital status: Single    Spouse name: Not on file   Number of children: 3   Years of education: Not on file  Highest education level: Bachelor's degree (e.g., BA, AB, BS)  Occupational History   Occupation: disabled   Occupation: Ship broker  Tobacco Use   Smoking status: Never   Smokeless tobacco: Never  Vaping Use   Vaping Use: Never used  Substance and Sexual Activity   Alcohol use: Not Currently   Drug use: No   Sexual activity: Not on file  Other Topics Concern   Not on file  Social History Narrative   Not on file   Social Determinants of Health   Financial Resource Strain: Not on file  Food Insecurity: Not on file  Transportation Needs: Not on file  Physical Activity: Not on file  Stress: Not on file  Social Connections: Not on file    Activities of Daily Living In your present state of health, do you have any difficulty performing the following activities: 10/26/2021  Hearing? N  Vision? N  Difficulty concentrating or making decisions? N  Walking or climbing stairs? N  Dressing or bathing? N  Doing errands, shopping? N  Preparing Food and eating ? N  Using the Toilet? N  In the past six months, have you accidently leaked urine? N  Do you have problems with loss of bowel control? N  Managing your Medications? N  Managing your Finances? N  Housekeeping or managing your Housekeeping? N  Some recent data might be hidden    Patient Education/ Literacy How often do you  need to have someone help you when you read instructions, pamphlets, or other written materials from your doctor or pharmacy?: 1 - Never What is the last grade level you completed in school?: College  Exercise Current Exercise Habits: The patient does not participate in regular exercise at present  Diet Patient reports consuming 2 meals a day and 2 snack(s) a day Patient reports that her primary diet is: Regular Patient reports that she does have regular access to food.   Depression Screen PHQ 2/9 Scores 03/08/2021 10/01/2019 09/20/2019 09/06/2019 03/12/2018 01/29/2018 02/27/2017  PHQ - 2 Score 0 5 0 0 0 0 0  PHQ- 9 Score 8 - - - - - -     Fall Risk Fall Risk  10/01/2019 09/20/2019 09/06/2019 03/12/2018 01/29/2018  Falls in the past year? 0 0 0 No No  Follow up - Falls prevention discussed - - -     Objective:  Grace Barajas seemed alert and oriented and she participated appropriately during our telephone visit.  Blood Pressure Weight BMI  BP Readings from Last 3 Encounters:  08/23/21 140/78  05/16/21 118/74  03/08/21 120/74   Wt Readings from Last 3 Encounters:  08/23/21 214 lb 9.6 oz (97.3 kg)  05/16/21 208 lb (94.3 kg)  03/08/21 214 lb (97.1 kg)   BMI Readings from Last 1 Encounters:  08/23/21 35.17 kg/m    *Unable to obtain current vital signs, weight, and BMI due to telephone visit type  Hearing/Vision  Grace Barajas did not seem to have difficulty with hearing/understanding during the telephone conversation Reports that she has not had a formal eye exam by an eye care professional within the past year Reports that she has not had a formal hearing evaluation within the past year *Unable to fully assess hearing and vision during telephone visit type  Cognitive Function: 6CIT Screen 10/26/2021 09/20/2019  What Year? 0 points 0 points  What month? 0 points 0 points  What time? 0 points 0 points  Count back from 20 0 points 0 points  Months in  reverse 0 points 0 points  Repeat  phrase 0 points 0 points  Total Score 0 0   (Normal:0-7, Significant for Dysfunction: >8)  Normal Cognitive Function Screening: Yes   Immunization & Health Maintenance Record Immunization History  Administered Date(s) Administered   Influenza,inj,Quad PF,6+ Mos 08/29/2014, 01/02/2015, 09/12/2016   Influenza-Unspecified 12/20/2015   Tdap 09/24/2008, 10/22/2012    Health Maintenance  Topic Date Due   COVID-19 Vaccine (1) Never done   Pneumococcal Vaccine 35-31 Years old (1 - PCV) Never done   Zoster Vaccines- Shingrix (1 of 2) Never done   OPHTHALMOLOGY EXAM  06/11/2015   INFLUENZA VACCINE  05/28/2021   HEMOGLOBIN A1C  09/08/2021   MAMMOGRAM  03/08/2022 (Originally 09/21/2020)   FOOT EXAM  03/08/2022   URINE MICROALBUMIN  03/08/2022   TETANUS/TDAP  10/22/2022   PAP SMEAR-Modifier  03/08/2024   COLONOSCOPY (Pts 45-53yrs Insurance coverage will need to be confirmed)  12/21/2024   Hepatitis C Screening  Completed   HIV Screening  Completed   HPV VACCINES  Aged Out       Assessment  This is a routine wellness examination for Grace Barajas.  Health Maintenance: Due or Overdue Health Maintenance Due  Topic Date Due   COVID-19 Vaccine (1) Never done   Pneumococcal Vaccine 41-57 Years old (1 - PCV) Never done   Zoster Vaccines- Shingrix (1 of 2) Never done   OPHTHALMOLOGY EXAM  06/11/2015   INFLUENZA VACCINE  05/28/2021   HEMOGLOBIN A1C  09/08/2021    Grace Barajas does not need a referral for [employer] Assistance: Care Management:   no Social Work:    no Prescription Assistance:  no Nutrition/Diabetes Education:  no   Plan:  Personalized Goals  Goals Addressed             This Visit's Progress    Exercise 150 min/wk Moderate Activity   Not on track      Personalized Health Maintenance & Screening Recommendations  Influenza vaccine Diabetes screening Shingles Vaccine Covid Vaccine  Lung Cancer Screening Recommended: no (Low Dose CT Chest  recommended if Age 61-80 years, 30 pack-year currently smoking OR have quit w/in past 15 years) Hepatitis C Screening recommended: no HIV Screening recommended: no  Advanced Directives: Written information was not prepared per patient's request.  Referrals & Orders No orders of the defined types were placed in this encounter.   Follow-up Plan Follow-up with Sharion Balloon, FNP as planned Schedule 11/05/2021    I have personally reviewed and noted the following in the patients chart:   Medical and social history Use of alcohol, tobacco or illicit drugs  Current medications and supplements Functional ability and status Nutritional status Physical activity Advanced directives List of other physicians Hospitalizations, surgeries, and ER visits in previous 12 months Vitals Screenings to include cognitive, depression, and falls Referrals and appointments  In addition, I have reviewed and discussed with Grace Barajas certain preventive protocols, quality metrics, and best practice recommendations. A written personalized care plan for preventive services as well as general preventive health recommendations is available and can be mailed to the patient at her request.      Maud Deed [facility]  83/06/[id number]

## 2021-10-26 NOTE — Patient Instructions (Signed)
°  [place] Maintenance Summary and Written Plan of Care  [name] ,  Thank you for allowing me to perform your Medicare Annual Wellness Visit and for your ongoing commitment to your health.   Health Maintenance & Immunization History Health Maintenance  Topic Date Due   COVID-19 Vaccine (1) Never done   Pneumococcal Vaccine 34-56 Years old (1 - PCV) Never done   Zoster Vaccines- Shingrix (1 of 2) Never done   OPHTHALMOLOGY EXAM  06/11/2015   INFLUENZA VACCINE  05/28/2021   HEMOGLOBIN A1C  09/08/2021   MAMMOGRAM  03/08/2022 (Originally 09/21/2020)   FOOT EXAM  03/08/2022   URINE MICROALBUMIN  03/08/2022   TETANUS/TDAP  10/22/2022   PAP SMEAR-Modifier  03/08/2024   COLONOSCOPY (Pts 45-36yrs Insurance coverage will need to be confirmed)  12/21/2024   Hepatitis C Screening  Completed   HIV Screening  Completed   HPV VACCINES  Aged Out   Immunization History  Administered Date(s) Administered   Influenza,inj,Quad PF,6+ Mos 08/29/2014, 01/02/2015, 09/12/2016   Influenza-Unspecified 12/20/2015   Tdap 09/24/2008, 10/22/2012    These are the patient goals that we discussed:  Goals Addressed             This Visit's Progress    Exercise 150 min/wk Moderate Activity   Not on track        This is a list of Health Maintenance Items that are overdue or due now: Health Maintenance Due  Topic Date Due   COVID-19 Vaccine (1) Never done   Pneumococcal Vaccine 77-38 Years old (1 - PCV) Never done   Zoster Vaccines- Shingrix (1 of 2) Never done   OPHTHALMOLOGY EXAM  06/11/2015   INFLUENZA VACCINE  05/28/2021   HEMOGLOBIN A1C  09/08/2021     Orders/Referrals Placed Today: No orders of the defined types were placed in this encounter.  (Contact our referral department at [phone] if you have not spoken with someone about your referral appointment within the next 5 days)    Follow-up Plan  Scheduled with Evelina Dun, FNP on 11/05/2021 at  12:00pm

## 2021-11-02 ENCOUNTER — Encounter: Payer: Self-pay | Admitting: Gastroenterology

## 2021-11-05 ENCOUNTER — Ambulatory Visit: Payer: Medicare Other | Admitting: Family

## 2021-11-05 ENCOUNTER — Encounter: Payer: Self-pay | Admitting: Family

## 2021-11-07 ENCOUNTER — Encounter: Payer: Self-pay | Admitting: Family

## 2021-11-07 ENCOUNTER — Ambulatory Visit (INDEPENDENT_AMBULATORY_CARE_PROVIDER_SITE_OTHER): Payer: Medicare Other | Admitting: Family

## 2021-11-07 VITALS — BP 115/72 | HR 84 | Temp 98.3°F | Ht 65.5 in | Wt 216.0 lb

## 2021-11-07 DIAGNOSIS — F251 Schizoaffective disorder, depressive type: Secondary | ICD-10-CM | POA: Diagnosis not present

## 2021-11-07 DIAGNOSIS — M25562 Pain in left knee: Secondary | ICD-10-CM

## 2021-11-07 DIAGNOSIS — F411 Generalized anxiety disorder: Secondary | ICD-10-CM | POA: Diagnosis not present

## 2021-11-07 DIAGNOSIS — Z23 Encounter for immunization: Secondary | ICD-10-CM

## 2021-11-07 DIAGNOSIS — G8929 Other chronic pain: Secondary | ICD-10-CM

## 2021-11-07 DIAGNOSIS — G4733 Obstructive sleep apnea (adult) (pediatric): Secondary | ICD-10-CM

## 2021-11-07 DIAGNOSIS — E559 Vitamin D deficiency, unspecified: Secondary | ICD-10-CM

## 2021-11-07 DIAGNOSIS — E1169 Type 2 diabetes mellitus with other specified complication: Secondary | ICD-10-CM

## 2021-11-07 DIAGNOSIS — E785 Hyperlipidemia, unspecified: Secondary | ICD-10-CM | POA: Diagnosis not present

## 2021-11-07 DIAGNOSIS — F419 Anxiety disorder, unspecified: Secondary | ICD-10-CM

## 2021-11-07 DIAGNOSIS — F339 Major depressive disorder, recurrent, unspecified: Secondary | ICD-10-CM

## 2021-11-07 LAB — BAYER DCA HB A1C WAIVED: HB A1C (BAYER DCA - WAIVED): 6.2 % — ABNORMAL HIGH (ref 4.8–5.6)

## 2021-11-07 MED ORDER — OZEMPIC (0.25 OR 0.5 MG/DOSE) 2 MG/1.5ML ~~LOC~~ SOPN: 0.2500 mg | PEN_INJECTOR | SUBCUTANEOUS | 1 refills | Status: AC

## 2021-11-07 NOTE — Patient Instructions (Signed)
Preparing for Knee Replacement Getting prepared before knee replacement surgery can make recovery easier and more comfortable. This document provides some tips and guidelines that will help you prepare for your surgery. Talk with your health care provider so you can learn what to expect before, during, and after surgery. Ask questions if you do not understand something. Tell a health care provider about: Any allergies you have. All medicines you are taking, including vitamins, herbs, eye drops, creams, and over-the-counter medicines. Any problems you or family members have had with anesthetic medicines. Any blood disorders you have. Any surgeries you have had. Any medical conditions you have. Whether you are pregnant or may be pregnant. What happens before the procedure? Visit your health care providers Keep all appointments before surgery. You will need to have a physical exam before surgery (preoperative exam) to make sure it is safe for you to have knee replacement surgery. You may also need to have more tests. When you go to the exam, bring a list of all the medicines and supplements, including herbs and vitamins, that you take. Have dental care and routine cleanings done before surgery. Germs from anywhere in your body, including your mouth, can travel to your new joint and infect it. Tell your dentist that you plan to have knee replacement surgery. Know the costs of surgery To find out how much the surgery will cost, call your insurance company as soon as you decide to have surgery. Ask questions like: How much of the surgery and hospital stay will be covered? What will be covered for: Medical equipment? Rehabilitation facilities? Home care? Prepare your home Pick a recovery spot that is not your bed. It is better that you sit more upright during recovery. You may want to use a recliner. Place items that you often use on a small table near your recovery spot. These may include the TV  remote, a cordless phone or your mobile phone, a book, a laptop computer, and a water glass. Move other items you will need to shelves and drawers that are at countertop height. Do this in your kitchen, bathroom, and bedroom. You may be given a walker to use at home. Check if you will have enough room to use a walker. Move around your home with your hands out about 6 inches (15 cm) from your sides. You will have enough room if you do not hit anything with your hands as you do this. Walk from: Your recovery spot to your kitchen and bathroom. Your bed to the bathroom. Prepare some meals to freeze and reheat later. Make your home safe for recovery   Remove all clutter and throw rugs from your floors. This will help you avoid tripping. Consider getting safety equipment that will be helpful during your recovery, such as: Grab bars in the shower and near the toilet. A raised toilet seat. This will help you get on and off the toilet more easily. A tub or shower bench. Prepare your body If you smoke, quit as soon as you can before surgery. If there is time, it is best to quit several months before surgery. Tell your surgeon if you use any products that contain nicotine or tobacco. These products include cigarettes, chewing tobacco, and vaping devices, such as e-cigarettes. These can delay healing. If you need help quitting, ask your health care provider. Talk to your health care provider about doing exercises before your surgery. Doing these exercises in the weeks before your surgery may help reduce pain and  improve function after surgery. Be sure to follow the exercise program given by your health care provider. Maintain a healthy diet. Do not change your diet before surgery unless your health care provider tells you to do that. Do not drink any alcohol for at least 48 hours before surgery. Plan your recovery In the first couple of weeks after surgery, it will be harder for you to do some of your  regular activities. You may get tired easily, and you will have limited movement in your leg. To make sure you have all the help you need after your surgery: Plan to have a responsible adult take you home from the hospital. Your health care provider will tell you how many days you can expect to be in the hospital. Cancel all work, caregiving, and volunteer responsibilities for at least 4-6 weeks after surgery. Plan to have a responsible adult stay with you day and night for the first week. This person should be someone you are comfortable with. You may need this person to help you with your exercises and personal care, such as bathing and using the toilet. If you live alone, arrange for someone to take care of your home and pets for the first 4-6 weeks after surgery. Arrange for drivers to take you to follow-up visits, the grocery store, and other places you may need to go for at least 4-6 weeks. Consider applying for a disability parking permit. To get an application, call your local department of motor vehicles [facility]) or your health care provider's office. Summary Getting prepared before knee replacement surgery can make your recovery easier and more comfortable. Keep all visits to your health care provider before surgery. You will have an exam and may have tests to make sure that you are ready for your surgery. Prepare your home and arrange for help at home. Plan to have a responsible adult take you home from the hospital. Also, plan to have a responsible adult stay with you day and night for the first week after you leave the hospital. This information is not intended to replace advice given to you by your health care provider. Make sure you discuss any questions you have with your health care provider. Document Revised: 04/04/2020 Document Reviewed: 04/04/2020 Elsevier Patient Education  [place].

## 2021-11-07 NOTE — Addendum Note (Signed)
Addended by: Ladean Raya on: 11/07/2021 02:34 PM   Modules accepted: Orders

## 2021-11-07 NOTE — Progress Notes (Signed)
Subjective:    Patient ID: Grace Barajas, female    DOB: 10-23-1966, 56 y.o.   MRN: [id number]  Chief Complaint  Patient presents with   Medical Management of Chronic Issues   Pt presents to the office today for chronic follow up. She followed by [facility] every month for schizoaffective, anxiety, and depression. She is followed by Ortho for chronic knee pain and was told she needed left knee replacement.   She has OSA and uses CPAP nightly.  Diabetes She presents for her follow-up diabetic visit. She has type 2 diabetes mellitus. Hypoglycemia symptoms include nervousness/anxiousness. Pertinent negatives for diabetes include no blurred vision and no foot paresthesias. Symptoms are stable. Diabetic complications include heart disease. Pertinent negatives for diabetic complications include no peripheral neuropathy. Risk factors for coronary artery disease include dyslipidemia, diabetes mellitus, hypertension and sedentary lifestyle. She is following a generally unhealthy diet. (Not checking her BS at home) Eye exam is not current.  Hyperlipidemia This is a chronic problem. The current episode started more than 1 year ago. The problem is controlled. Exacerbating diseases include obesity. She is currently on no antihyperlipidemic treatment. The current treatment provides mild improvement of lipids. Risk factors for coronary artery disease include dyslipidemia, hypertension, a sedentary lifestyle and post-menopausal.  Anxiety Presents for follow-up visit. Symptoms include excessive worry, irritability, nervous/anxious behavior and restlessness. Symptoms occur most days. The severity of symptoms is moderate.    Depression        This is a chronic problem.  The current episode started more than 1 year ago.   The problem occurs intermittently.  Associated symptoms include irritable, restlessness, appetite change and sad.  Associated symptoms include no helplessness and no hopelessness.   Past medical history includes anxiety.      Review of Systems  Constitutional:  Positive for appetite change and irritability.  Eyes:  Negative for blurred vision.  Psychiatric/Behavioral:  Positive for depression. The patient is nervous/anxious.   All other systems reviewed and are negative.     Objective:   Physical Exam Vitals reviewed.  Constitutional:      General: She is irritable. She is not in acute distress.    Appearance: She is well-developed. She is obese.  HENT:     Head: Normocephalic and atraumatic.     Right Ear: Tympanic membrane normal.     Left Ear: Tympanic membrane normal.  Eyes:     Pupils: Pupils are equal, round, and reactive to light.  Neck:     Thyroid: No thyromegaly.  Cardiovascular:     Rate and Rhythm: Normal rate and regular rhythm.     Heart sounds: Normal heart sounds. No murmur heard. Pulmonary:     Effort: Pulmonary effort is normal. No respiratory distress.     Breath sounds: Normal breath sounds. No wheezing.  Abdominal:     General: Bowel sounds are normal. There is no distension.     Palpations: Abdomen is soft.     Tenderness: There is no abdominal tenderness.  Musculoskeletal:        General: Tenderness (pain in right knee with flexion and extension) present. Normal range of motion.     Cervical back: Normal range of motion and neck supple.  Skin:    General: Skin is warm and dry.  Neurological:     Mental Status: She is alert and oriented to person, place, and time.     Cranial Nerves: No cranial nerve deficit.     Deep Tendon  Reflexes: Reflexes are normal and symmetric.  Psychiatric:        Behavior: Behavior normal.        Thought Content: Thought content normal.        Judgment: Judgment normal.         BP 115/72    Pulse 84    Temp 98.3 F (36.8 C) (Temporal)    Ht 5' 5.5" (1.664 m)    Wt 216 lb (98 kg)    LMP 01/11/2013    BMI 35.40 kg/m   Assessment & Plan:  Grace Barajas comes in today with chief complaint  of Medical Management of Chronic Issues   Diagnosis and orders addressed:  1. Schizoaffective disorder, depressive type ([place]) - CMP14+EGFR - CBC with Differential/Platelet  2. Morbid obesity ([place]) - CMP14+EGFR - CBC with Differential/Platelet  3. GAD (generalized anxiety disorder) - CMP14+EGFR - CBC with Differential/Platelet  4. Hyperlipidemia associated with type 2 diabetes mellitus (HCC) - CMP14+EGFR - CBC with Differential/Platelet  5. Anxiety - CMP14+EGFR - CBC with Differential/Platelet  6. Episode of recurrent major depressive disorder, unspecified depression episode severity ([place]) - CMP14+EGFR - CBC with Differential/Platelet  7. Chronic pain of left knee - CMP14+EGFR - CBC with Differential/Platelet  8. Vitamin D deficiency - CMP14+EGFR - CBC with Differential/Platelet  9. OSA (obstructive sleep apnea) - CMP14+EGFR - CBC with Differential/Platelet  10. Type 2 diabetes mellitus with other specified complication, without long-term current use of insulin (HCC) Stop metformin  Start Ozempic 0.25 mg weekly  - CMP14+EGFR - CBC with Differential/Platelet - Bayer DCA Hb A1c Waived - Semaglutide,0.25 or 0.5MG/DOS, (OZEMPIC, 0.25 OR 0.5 MG/DOSE,) 2 MG/1.5ML SOPN; Inject 0.25 mg into the skin once a week for 28 days.  Dispense: 6 mL; Refill: 1   Labs pending Health Maintenance reviewed Diet and exercise encouraged  Follow up plan: 2 months to follow up on [place], FNP

## 2021-11-08 LAB — CMP14+EGFR
ALT: 15 IU/L (ref 0–32)
AST: 12 IU/L (ref 0–40)
Albumin/Globulin Ratio: 1.7 (ref 1.2–2.2)
Albumin: 4.6 g/dL (ref 3.8–4.9)
Alkaline Phosphatase: 114 IU/L (ref 44–121)
BUN/Creatinine Ratio: 16 (ref 9–23)
BUN: 11 mg/dL (ref 6–24)
Bilirubin Total: 0.5 mg/dL (ref 0.0–1.2)
CO2: 24 mmol/L (ref 20–29)
Calcium: 10.3 mg/dL — ABNORMAL HIGH (ref 8.7–10.2)
Chloride: 102 mmol/L (ref 96–106)
Creatinine, Ser: 0.68 mg/dL (ref 0.57–1.00)
Globulin, Total: 2.7 g/dL (ref 1.5–4.5)
Glucose: 110 mg/dL — ABNORMAL HIGH (ref 70–99)
Potassium: 4.5 mmol/L (ref 3.5–5.2)
Sodium: 142 mmol/L (ref 134–144)
Total Protein: 7.3 g/dL (ref 6.0–8.5)
eGFR: 102 mL/min/{1.73_m2} (ref >59)

## 2021-11-08 LAB — CBC WITH DIFFERENTIAL/PLATELET
Basophils Absolute: 0 10*3/uL (ref 0.0–0.2)
Basos: 0 %
EOS (ABSOLUTE): 0.2 10*3/uL (ref 0.0–0.4)
Eos: 3 %
Hematocrit: 36.2 % (ref 34.0–46.6)
Hemoglobin: 12.3 g/dL (ref 11.1–15.9)
Immature Grans (Abs): 0 10*3/uL (ref 0.0–0.1)
Immature Granulocytes: 0 %
Lymphocytes Absolute: 2.2 10*3/uL (ref 0.7–3.1)
Lymphs: 47 %
MCH: 29.4 pg (ref 26.6–33.0)
MCHC: 34 g/dL (ref 31.5–35.7)
MCV: 86 fL (ref 79–97)
Monocytes Absolute: 0.4 10*3/uL (ref 0.1–0.9)
Monocytes: 7 %
Neutrophils Absolute: 2.1 10*3/uL (ref 1.4–7.0)
Neutrophils: 43 %
Platelets: 392 10*3/uL (ref 150–450)
RBC: 4.19 x10E6/uL (ref 3.77–5.28)
RDW: 13.5 % (ref 11.7–15.4)
WBC: 4.9 10*3/uL (ref 3.4–10.8)

## 2021-12-07 ENCOUNTER — Ambulatory Visit: Payer: Medicare Other | Admitting: Gastroenterology

## 2021-12-07 ENCOUNTER — Encounter: Payer: Self-pay | Admitting: Gastroenterology

## 2021-12-07 VITALS — BP 110/64 | HR 88 | Ht 65.5 in | Wt 210.0 lb

## 2021-12-07 DIAGNOSIS — Z86010 Personal history of colonic polyps: Secondary | ICD-10-CM

## 2021-12-07 DIAGNOSIS — K5904 Chronic idiopathic constipation: Secondary | ICD-10-CM

## 2021-12-07 DIAGNOSIS — K219 Gastro-esophageal reflux disease without esophagitis: Secondary | ICD-10-CM | POA: Diagnosis not present

## 2021-12-07 MED ORDER — CITRUCEL PO POWD: ORAL | 1 refills | Status: DC

## 2021-12-07 MED ORDER — FAMOTIDINE 20 MG PO TABS: 20.0000 mg | ORAL_TABLET | Freq: Two times a day (BID) | ORAL | 3 refills | Status: DC

## 2021-12-07 NOTE — Patient Instructions (Signed)
We have sent the following medications to your pharmacy for you to pick up at your convenience:   Pepcid,Citrucel and colace  Follow up as needed  Follow up as needed  Due to recent changes in healthcare laws, you may see the results of your imaging and laboratory studies on MyChart before your provider has had a chance to review them.  We understand that in some cases there may be results that are confusing or concerning to you. Not all laboratory results come back in the same time frame and the provider may be waiting for multiple results in order to interpret others.  Please give [place] 48 hours in order for your provider to thoroughly review all the results before contacting the office for clarification of your results.    If you are age 2 or older, your body mass index should be between 23-30. Your Body mass index is 34.41 kg/m. If this is out of the aforementioned range listed, please consider follow up with your Primary Care Provider.  If you are age 11 or younger, your body mass index should be between 19-25. Your Body mass index is 34.41 kg/m. If this is out of the aformentioned range listed, please consider follow up with your Primary Care Provider.   ________________________________________________________  The [place] [facility] providers would like to encourage you to use [facility] to communicate with providers for non-urgent requests or questions.  Due to long hold times on the telephone, sending your provider a message by [facility] may be a faster and more efficient way to get a response.  Please allow 48 business hours for a response.  Please remember that this is for non-urgent requests.   I appreciate the  opportunity to care for you  Thank You   Harl Bowie , MD  _______________________________________________________

## 2021-12-07 NOTE — Progress Notes (Signed)
Grace Barajas    [id number]    11-30-1965  Primary Care Physician:Hawks, Theador Hawthorne, FNP  Referring Physician: Sharion Balloon, [address] [place],  [place] [id number]   Chief complaint:  Heartburn, Constipation, h/o colon polyp  HPI:  56 year old female here for follow-up visit for chronic constipation, heartburn and to discuss surveillance colonoscopy due to history of colon polyps Overall her symptoms of GERD are well controlled, she has intermittent heartburn on average once a week.  She is currently not on PPI She is having episodes of constipation, on average has a bowel movement every other day.  She recently got Citrucel but has not started taking it yet.  Denies any rectal bleeding or melena.  No abdominal pain, nausea, vomiting, dysphagia or odynophagia.  Colonoscopy 12/22/19 - Two 3 to 4 mm polyps in the descending colon and in the transverse colon, removed with a cold snare. Resected and retrieved. - Two 1 to 2 mm polyps in the rectum and in the transverse colon, removed with a cold biopsy forceps. Resected and retrieved. - Non-bleeding internal hemorrhoids.  Colonoscopy 06/25/2018 - Preparation of the colon was fair. - One 2 mm polyp in the cecum, removed with a cold biopsy forceps. Resected and retrieved. - Four 4 to 7 mm polyps in the transverse colon and in the ascending colon, removed with a cold snare. Resected and retrieved. - One 11 mm polyp in the sigmoid colon, removed with a hot snare. Resected and retrieved. - Diverticulosis in the sigmoid colon. - Non-bleeding internal hemorrhoids.  EGD 06/25/2018 - Z-line regular, 38 cm from the incisors. - No endoscopic esophageal abnormality to explain patient's dysphagia. - Gastritis. Biopsied. - Normal examined duodenum.     Outpatient Encounter Medications as of 12/07/2021  Medication Sig   buPROPion (WELLBUTRIN XL) 300 MG 24 hr tablet Take 300 mg by mouth daily.   cetirizine  (ZYRTEC) 10 MG chewable tablet Chew 10 mg by mouth daily.   FLUoxetine HCl (PROZAC PO) Take 80 mg by mouth daily.   QUEtiapine (SEROQUEL) 100 MG tablet Take 100 mg by mouth at bedtime.   rosuvastatin (CRESTOR) 5 MG tablet Take 1 tablet (5 mg total) by mouth daily.   Semaglutide (OZEMPIC, 0.25 OR 0.5 MG/DOSE, [place]) Inject 1 Dose into the skin once a week.   [DISCONTINUED] CALCIUM PO Take by mouth daily.    [DISCONTINUED] fluticasone (FLONASE) 50 MCG/ACT nasal spray Place 2 sprays into both nostrils at bedtime.    [DISCONTINUED] GuanFACINE HCl 3 MG TB24 Take 1 tablet by mouth daily. (Patient not taking: Reported on 11/07/2021)   No facility-administered encounter medications on file as of 12/07/2021.    Allergies as of 12/07/2021 - Review Complete 12/07/2021  Allergen Reaction Noted   Morphine and related  01/05/2013    Past Medical History:  Diagnosis Date   Acute medial meniscus tear of left knee 09/2019   Allergy    SEASONAL   Anxiety    COVID 04-2020 or 05-2020   cough tired flu like symptoms x 7 days symptoms resolved   Depression    GERD (gastroesophageal reflux disease)    Heart murmur    mild no cardiologist   High cholesterol    Pneumonia 2018   Pre-diabetes    Sinus disorder    Sleep apnea    uses CPAP   Sleep apnea with use of continuous positive airway pressure (CPAP)    Snores  Wears glasses     Past Surgical History:  Procedure Laterality Date   BILATERAL SALPINGECTOMY Bilateral 01/21/2013   Procedure: BILATERAL SALPINGECTOMY;  Surgeon: Marvene Staff, MD;  Location: [facility];  Service: Gynecology;  Laterality: Bilateral;   CARPAL TUNNEL RELEASE Left 07/03/2007   CARPAL TUNNEL RELEASE Right 07/31/2007   CERVICAL FUSION  1993   C1/C2   COLONOSCOPY  06/26/2019   DORSAL COMPARTMENT RELEASE Bilateral 01/13/2014   Procedure: BILATERAL 1ST DORSAL COMPARTMENT RELEASES;  Surgeon: Cammie Sickle., MD;  Location: [facility];  Service: Orthopedics;   Laterality: Bilateral;  bilateral   JOINT REPLACEMENT     KNEE ARTHROSCOPY WITH SUBCHONDROPLASTY Left 10/05/2020   Procedure: Left knee arthroscopic partial medial meniscectomy with medial tibial subchondroplasty;  Surgeon: Nicholes Stairs, MD;  Location: [facility];  Service: Orthopedics;  Laterality: Left;  75 mins   left knee meniscus surgery  09/2019   [facility] [place]   ROBOTIC ASSISTED TOTAL HYSTERECTOMY N/A 01/21/2013   Procedure: ROBOTIC ASSISTED TOTAL HYSTERECTOMY;  Surgeon: Marvene Staff, MD;  Location: [facility];  Service: Gynecology;  Laterality: N/A;   TUBAL LIGATION  yrs ago    Family History  Problem Relation Age of Onset   Asthma Son    Asthma Brother    Obesity Father    Diabetes Father    Heart disease Father    Hyperlipidemia Father    Diabetes Maternal Grandmother    Diabetes Maternal Aunt    Diabetes Maternal Uncle    Colon polyps Maternal Uncle    Thyroid disease Neg Hx    Breast cancer Neg Hx    Colon cancer Neg Hx    Esophageal cancer Neg Hx    Rectal cancer Neg Hx    Stomach cancer Neg Hx     Social History   Socioeconomic History   Marital status: Single    Spouse name: Not on file   Number of children: 3   Years of education: Not on file   Highest education level: Bachelor's degree (e.g., BA, AB, BS)  Occupational History   Occupation: disabled   Occupation: Ship broker  Tobacco Use   Smoking status: Never   Smokeless tobacco: Never  Vaping Use   Vaping Use: Never used  Substance and Sexual Activity   Alcohol use: Not Currently    Comment: rare   Drug use: No   Sexual activity: Not on file  Other Topics Concern   Not on file  Social History Narrative   Not on file   Social Determinants of Health   Financial Resource Strain: Not on file  Food Insecurity: Not on file  Transportation Needs: Not on file  Physical Activity: Not on file  Stress: Not on file  Social Connections: Not on file  Intimate  Partner Violence: Not on file      Review of systems: All other review of systems negative except as mentioned in the HPI.   Physical Exam: Vitals:   12/07/21 1349  BP: 110/64  Pulse: 88   Body mass index is 34.41 kg/m. Gen:      No acute distress HEENT:  sclera anicteric Abd:      soft, non-tender; no palpable masses, no distension Ext:    No edema Neuro: alert and oriented x 3 Psych: normal mood and affect  Data Reviewed:  Reviewed labs, radiology imaging, old records and pertinent past GI work up   Assessment and Plan/Recommendations:  56 year old  very pleasant female with history of multiple adenomatous colon polyps, chronic GERD and chronic idiopathic constipation  History of multiple adenomatous colon polyps: She is up-to-date with surveillance colonoscopies, due for recall colonoscopy in February 2026  Heartburn and chronic GERD: Use Pepcid 20 mg twice daily as needed Continue with dietary modifications and antireflux measures  Chronic idiopathic constipation: Increase dietary fiber and water intake to 8 to 10 cups daily Use Citrucel 1 tablespoon 2-3 times daily with meals  Return as needed  This visit required 30 minutes of patient care (this includes precharting, chart review, review of results, face-to-face time used for counseling as well as treatment plan and follow-up. The patient was provided an opportunity to ask questions and all were answered. The patient agreed with the plan and demonstrated an understanding of the instructions.  Damaris Hippo , MD    CC: Sharion Balloon, FNP

## 2022-01-07 ENCOUNTER — Ambulatory Visit: Payer: Medicare Other | Admitting: Family

## 2022-01-14 ENCOUNTER — Encounter: Payer: Self-pay | Admitting: Family

## 2022-01-14 ENCOUNTER — Ambulatory Visit (INDEPENDENT_AMBULATORY_CARE_PROVIDER_SITE_OTHER): Payer: Medicare Other | Admitting: Family

## 2022-01-14 VITALS — BP 104/70 | HR 78 | Temp 98.0°F | Ht 65.5 in | Wt 213.4 lb

## 2022-01-14 DIAGNOSIS — F411 Generalized anxiety disorder: Secondary | ICD-10-CM | POA: Diagnosis not present

## 2022-01-14 DIAGNOSIS — F339 Major depressive disorder, recurrent, unspecified: Secondary | ICD-10-CM

## 2022-01-14 DIAGNOSIS — E669 Obesity, unspecified: Secondary | ICD-10-CM

## 2022-01-14 DIAGNOSIS — R7303 Prediabetes: Secondary | ICD-10-CM | POA: Diagnosis not present

## 2022-01-14 DIAGNOSIS — G4733 Obstructive sleep apnea (adult) (pediatric): Secondary | ICD-10-CM

## 2022-01-14 DIAGNOSIS — Z713 Dietary counseling and surveillance: Secondary | ICD-10-CM | POA: Diagnosis not present

## 2022-01-14 DIAGNOSIS — F251 Schizoaffective disorder, depressive type: Secondary | ICD-10-CM

## 2022-01-14 DIAGNOSIS — Z23 Encounter for immunization: Secondary | ICD-10-CM

## 2022-01-14 MED ORDER — SEMAGLUTIDE (1 MG/DOSE) 4 MG/3ML ~~LOC~~ SOPN: 1.0000 mg | PEN_INJECTOR | SUBCUTANEOUS | 2 refills | Status: DC

## 2022-01-14 MED ORDER — OZEMPIC (0.25 OR 0.5 MG/DOSE) 2 MG/1.5ML ~~LOC~~ SOPN: 0.5000 mg | PEN_INJECTOR | SUBCUTANEOUS | 0 refills | Status: AC

## 2022-01-14 NOTE — Progress Notes (Signed)
? ?Subjective:  ? ? Patient ID: Grace Barajas, female    DOB: 10-02-1966, 56 y.o.   MRN: [id number] ? ?Chief Complaint  ?Patient presents with  ? Follow-up  ?  2 mth follow up on ozempic. Was doing good for the first 3 weeks now not doing good   ? ?Pt presents to the office today for chronic follow up. She followed by [facility] every month for schizoaffective, anxiety, and depression. She is followed by Ortho for chronic knee pain and was told she needed left knee replacement.  ?  ?She has OSA and uses CPAP nightly.  ? ?She was started on Ozempic 0.25 mg weekly. She lost 3 lb since our last visit.  ?Diabetes ?She presents for her follow-up diabetic visit. She has type 2 diabetes mellitus. Hypoglycemia symptoms include nervousness/anxiousness. Associated symptoms include fatigue. Pertinent negatives for diabetes include no blurred vision and no foot paresthesias. Symptoms are stable. Risk factors for coronary artery disease include dyslipidemia, diabetes mellitus, hypertension, sedentary lifestyle and post-menopausal. (Not checking her BS at home) Eye exam is not current.  ?Anxiety ?Presents for follow-up visit. Symptoms include excessive worry, irritability, nervous/anxious behavior and restlessness. Symptoms occur occasionally. The severity of symptoms is moderate.  ? ? ?Depression ?       This is a chronic problem.  The current episode started more than 1 year ago.   Associated symptoms include fatigue, irritable, restlessness and sad.  Associated symptoms include no helplessness and no hopelessness.  Past treatments include SSRIs - Selective serotonin reuptake inhibitors.  Past medical history includes anxiety.   ? ? ? ?Review of Systems  ?Constitutional:  Positive for fatigue and irritability.  ?Eyes:  Negative for blurred vision.  ?Psychiatric/Behavioral:  Positive for depression. The patient is nervous/anxious.   ?All other systems reviewed and are negative. ? ?   ?Objective:  ? Physical  Exam ?Vitals reviewed.  ?Constitutional:   ?   General: She is irritable. She is not in acute distress. ?   Appearance: She is well-developed.  ?HENT:  ?   Head: Normocephalic and atraumatic.  ?   Right Ear: Tympanic membrane normal.  ?   Left Ear: Tympanic membrane normal.  ?Eyes:  ?   Pupils: Pupils are equal, round, and reactive to light.  ?Neck:  ?   Thyroid: No thyromegaly.  ?Cardiovascular:  ?   Rate and Rhythm: Normal rate and regular rhythm.  ?   Heart sounds: Normal heart sounds. No murmur heard. ?Pulmonary:  ?   Effort: Pulmonary effort is normal. No respiratory distress.  ?   Breath sounds: Normal breath sounds. No wheezing.  ?Abdominal:  ?   General: Bowel sounds are normal. There is no distension.  ?   Palpations: Abdomen is soft.  ?   Tenderness: There is no abdominal tenderness.  ?Musculoskeletal:     ?   General: No tenderness. Normal range of motion.  ?   Cervical back: Normal range of motion and neck supple.  ?Skin: ?   General: Skin is warm and dry.  ?Neurological:  ?   Mental Status: She is alert and oriented to person, place, and time.  ?   Cranial Nerves: No cranial nerve deficit.  ?   Deep Tendon Reflexes: Reflexes are normal and symmetric.  ?Psychiatric:     ?   Behavior: Behavior normal.     ?   Thought Content: Thought content normal.     ?   Judgment: Judgment normal.  ? ? ? ? ?  BP 104/70   Pulse 78   Temp 98 ?F (36.7 ?C) (Temporal)   Ht 5' 5.5" (1.664 m)   Wt 213 lb 6.4 oz (96.8 kg)   LMP 01/11/2013   BMI 34.97 kg/m?  ? ?   ?Assessment & Plan:  ?Grace Kraft Cuthbertson comes in today with chief complaint of Follow-up (2 mth follow up on ozempic. Was doing good for the first 3 weeks now not doing good ) ? ? ?Diagnosis and orders addressed: ? ?1. OSA (obstructive sleep apnea) ? ?2. GAD (generalized anxiety disorder) ? ?3. Obesity (BMI 30-39.9) ?Will increase Ozempic to 0.5 mg weekly then increase 1 mg  ?Low carb diet  ?- Semaglutide,0.25 or 0.'5MG'$ /DOS, (OZEMPIC, 0.25 OR 0.5 MG/DOSE,) 2  MG/1.5ML SOPN; Inject 0.5 mg into the skin once a week for 28 days.  Dispense: 1.5 mL; Refill: 0 ?- Semaglutide, 1 MG/DOSE, 4 MG/3ML SOPN; Inject 1 mg as directed once a week.  Dispense: 3 mL; Refill: 2 ? ?4. Episode of recurrent major depressive disorder, unspecified depression episode severity ([place]) ? ? ?5. Schizoaffective disorder, depressive type ([place]) ? ? ?6. Prediabetes ?- Semaglutide,0.25 or 0.'5MG'$ /DOS, (OZEMPIC, 0.25 OR 0.5 MG/DOSE,) 2 MG/1.5ML SOPN; Inject 0.5 mg into the skin once a week for 28 days.  Dispense: 1.5 mL; Refill: 0 ?- Semaglutide, 1 MG/DOSE, 4 MG/3ML SOPN; Inject 1 mg as directed once a week.  Dispense: 3 mL; Refill: 2 ?- Microalbumin / creatinine urine ratio ? ?7. Weight loss counseling, encounter for ?- Semaglutide,0.25 or 0.'5MG'$ /DOS, (OZEMPIC, 0.25 OR 0.5 MG/DOSE,) 2 MG/1.5ML SOPN; Inject 0.5 mg into the skin once a week for 28 days.  Dispense: 1.5 mL; Refill: 0 ?- Semaglutide, 1 MG/DOSE, 4 MG/3ML SOPN; Inject 1 mg as directed once a week.  Dispense: 3 mL; Refill: 2 ? ? ?Labs pending ?Health Maintenance reviewed ?Diet and exercise encouraged ? ?Follow up plan: ?2-3 months  ? ? ?Evelina Dun, FNP ? ? ?

## 2022-01-14 NOTE — Patient Instructions (Signed)

## 2022-03-06 ENCOUNTER — Other Ambulatory Visit: Payer: Self-pay | Admitting: Family

## 2022-03-27 ENCOUNTER — Other Ambulatory Visit: Payer: Self-pay | Admitting: Family

## 2022-04-17 ENCOUNTER — Ambulatory Visit
Admission: RE | Admit: 2022-04-17 | Discharge: 2022-04-17 | Disposition: A | Discharge status: Home / Self Care | Payer: Medicare Other | Source: Ambulatory Visit | Attending: Family | Admitting: Family

## 2022-04-17 DIAGNOSIS — R911 Solitary pulmonary nodule: Secondary | ICD-10-CM

## 2022-04-17 DIAGNOSIS — E042 Nontoxic multinodular goiter: Secondary | ICD-10-CM

## 2022-04-25 ENCOUNTER — Ambulatory Visit (INDEPENDENT_AMBULATORY_CARE_PROVIDER_SITE_OTHER): Payer: Medicare Other | Admitting: Family

## 2022-04-25 ENCOUNTER — Encounter: Payer: Self-pay | Admitting: Family

## 2022-04-25 VITALS — BP 113/63 | HR 79 | Temp 99.6°F | Ht 65.5 in | Wt 199.0 lb

## 2022-04-25 DIAGNOSIS — G4733 Obstructive sleep apnea (adult) (pediatric): Secondary | ICD-10-CM | POA: Diagnosis not present

## 2022-04-25 DIAGNOSIS — E559 Vitamin D deficiency, unspecified: Secondary | ICD-10-CM | POA: Diagnosis not present

## 2022-04-25 DIAGNOSIS — E782 Mixed hyperlipidemia: Secondary | ICD-10-CM | POA: Diagnosis not present

## 2022-04-25 DIAGNOSIS — F339 Major depressive disorder, recurrent, unspecified: Secondary | ICD-10-CM | POA: Diagnosis not present

## 2022-04-25 DIAGNOSIS — R7303 Prediabetes: Secondary | ICD-10-CM

## 2022-04-25 DIAGNOSIS — F251 Schizoaffective disorder, depressive type: Secondary | ICD-10-CM | POA: Diagnosis not present

## 2022-04-25 DIAGNOSIS — F411 Generalized anxiety disorder: Secondary | ICD-10-CM | POA: Diagnosis not present

## 2022-04-25 DIAGNOSIS — E669 Obesity, unspecified: Secondary | ICD-10-CM

## 2022-04-25 LAB — BAYER DCA HB A1C WAIVED: HB A1C (BAYER DCA - WAIVED): 5.7 % — ABNORMAL HIGH (ref 4.8–5.6)

## 2022-04-25 NOTE — Patient Instructions (Addendum)
Our records indicate that you are due for your annual mammogram/breast imaging. While there is no way to prevent breast cancer, early detection provides the best opportunity for curing it. For women over the age of 40, the American Cancer Society recommends a yearly clinical breast exam and a yearly mammogram. These practices have saved thousands of lives. We need your help to ensure that you are receiving optimal medical care. Please call the imaging location that has done you previous mammograms. Please remember to list us as your primary care. This helps make sure we receive a report and can update your chart.  Below is the contact information for several local breast imaging centers. You may call the location that works best for you, and they will be happy to assistance in making you an appointment. You do not need an order for a regular screening mammogram. However, if you are having any problems or concerns with you breast area, please let your primary care provider know, and appropriate orders will be placed. Please let our office know if you have any questions or concerns. Or if you need information for another imaging center not on this list or outside of the area. We are commented to working with you on your health care journey.   The mobile unit/bus ([facility]) - they come twice a month to our location.  These appointments can be made through our office or by call [facility]  [facility]  [address] [place], [place] [address] Phone [phone]  [facility] Radiology Department  [address]  [place], [place] [address] [phone]  [facility])  [address] [place], [place] [address] [phone]  [facility] [place]  [address] [place] [address] [phone]  [facility] [place]  [address], [address] [place] [address] [phone]  [facility] [place]  [address] [place], [place] [address] [phone]  [facility] [address] [place], [place] [address] [phone]  [facility] [address] [place],  [address] [phone]  [facility] [address] [place] [address] [phone]   Health Maintenance, Female Adopting a healthy lifestyle and getting preventive care are important in promoting health and wellness. Ask your health care provider about: The right schedule for you to have regular tests and exams. Things you can do on your own to prevent diseases and keep yourself healthy. What should I know about diet, weight, and exercise? Eat a healthy diet  Eat a diet that includes plenty of vegetables, fruits, low-fat dairy products, and lean protein. Do not eat a lot of foods that are high in solid fats, added sugars, or sodium. Maintain a healthy weight Body mass index (BMI) is used to identify weight problems. It estimates body fat based on height and weight. Your health care provider can help determine your BMI and help you achieve or maintain a healthy weight. Get regular exercise Get regular exercise. This is one of the most important things you can do for your health. Most adults should: Exercise for at least 150 minutes each week. The exercise should increase your heart rate and make you sweat (moderate-intensity exercise). Do strengthening   exercises at least twice a week. This is in addition to the moderate-intensity exercise. Spend less time sitting. Even light physical activity can be beneficial. Watch cholesterol and blood lipids Have your blood tested for lipids and cholesterol at 57 years of age, then have this test every 5 years. Have your cholesterol levels checked  more often if: Your lipid or cholesterol levels are high. You are older than 57 years of age. You are at high risk for heart disease. What should I know about cancer screening? Depending on your health history and family history, you may need to have cancer screening at various ages. This may include screening for: Breast cancer. Cervical cancer. Colorectal cancer. Skin cancer. Lung cancer. What should I know about heart disease, diabetes, and high blood pressure? Blood pressure and heart disease High blood pressure causes heart disease and increases the risk of stroke. This is more likely to develop in people who have high blood pressure readings or are overweight. Have your blood pressure checked: Every 3-5 years if you are 18-39 years of age. Every year if you are 40 years old or older. Diabetes Have regular diabetes screenings. This checks your fasting blood sugar level. Have the screening done: Once every three years after age 40 if you are at a normal weight and have a low risk for diabetes. More often and at a younger age if you are overweight or have a high risk for diabetes. What should I know about preventing infection? Hepatitis B If you have a higher risk for hepatitis B, you should be screened for this virus. Talk with your health care provider to find out if you are at risk for hepatitis B infection. Hepatitis C Testing is recommended for: Everyone born from 1945 through 1965. Anyone with known risk factors for hepatitis C. Sexually transmitted infections (STIs) Get screened for STIs, including gonorrhea and chlamydia, if: You are sexually active and are younger than 57 years of age. You are older than 57 years of age and your health care provider tells you that you are at risk for this type of infection. Your sexual activity has changed since you were last screened, and you are at increased risk for chlamydia or gonorrhea. Ask your health care provider if you are at  risk. Ask your health care provider about whether you are at high risk for HIV. Your health care provider may recommend a prescription medicine to help prevent HIV infection. If you choose to take medicine to prevent HIV, you should first get tested for HIV. You should then be tested every 3 months for as long as you are taking the medicine. Pregnancy If you are about to stop having your period (premenopausal) and you may become pregnant, seek counseling before you get pregnant. Take 400 to 800 micrograms (mcg) of folic acid every day if you become pregnant. Ask for birth control (contraception) if you want to prevent pregnancy. Osteoporosis and menopause Osteoporosis is a disease in which the bones lose minerals and strength with aging. This can result in bone fractures. If you are 65 years old or older, or if you are at risk for osteoporosis and fractures, ask your health care provider if you should: Be screened for bone loss. Take a calcium or vitamin D supplement to lower your risk of fractures. Be given hormone replacement therapy (HRT) to treat symptoms of menopause. Follow these instructions at home: Alcohol use Do not drink alcohol if: Your health care provider tells you not to drink.   You are pregnant, may be pregnant, or are planning to become pregnant. If you drink alcohol: Limit how much you have to: 0-1 drink a day. Know how much alcohol is in your drink. In the [place] one drink equals one 12 oz bottle of beer (355 mL), one 5 oz glass of wine (148 mL), or one 1 oz glass of hard liquor (44 mL). Lifestyle Do not use any products that contain nicotine or tobacco. These products include cigarettes, chewing tobacco, and vaping devices, such as e-cigarettes. If you need help quitting, ask your health care provider. Do not use street drugs. Do not share needles. Ask your health care provider for help if you need support or information about quitting drugs. General instructions Schedule  regular health, dental, and eye exams. Stay current with your vaccines. Tell your health care provider if: You often feel depressed. You have ever been abused or do not feel safe at home. Summary Adopting a healthy lifestyle and getting preventive care are important in promoting health and wellness. Follow your health care provider's instructions about healthy diet, exercising, and getting tested or screened for diseases. Follow your health care provider's instructions on monitoring your cholesterol and blood pressure. This information is not intended to replace advice given to you by your health care provider. Make sure you discuss any questions you have with your health care provider. Document Revised: 03/05/2021 Document Reviewed: 03/05/2021 Elsevier Patient Education  2023 Elsevier Inc.     

## 2022-04-25 NOTE — Progress Notes (Signed)
Subjective:    Patient ID: Grace Barajas, female    DOB: 1966/09/24, 56 y.o.   MRN: [id number]  Chief Complaint  Patient presents with   Medical Management of Chronic Issues   Pt presents to the office today for chronic follow up. She followed by [facility] every month for schizoaffective, anxiety, and depression. She is followed by Ortho for chronic knee pain and was told she needed left knee replacement.    She has OSA and uses CPAP nightly.    She was started on Ozempic 0.25 mg weekly. She lost 13 lb since our last visit.  Hyperlipidemia This is a chronic problem. The current episode started more than 1 year ago. The problem is controlled. Recent lipid tests were reviewed and are normal. Current antihyperlipidemic treatment includes statins. The current treatment provides moderate improvement of lipids. Risk factors for coronary artery disease include dyslipidemia, hypertension and a sedentary lifestyle.  Anxiety Presents for follow-up visit. Symptoms include depressed mood, excessive worry, irritability, nervous/anxious behavior and restlessness. Symptoms occur occasionally.    Depression        This is a chronic problem.  The current episode started more than 1 year ago.   The onset quality is gradual.   The problem occurs intermittently.  Associated symptoms include fatigue and restlessness.  Associated symptoms include no helplessness, no hopelessness, no decreased interest and not sad.  Past medical history includes anxiety.   Diabetes She presents for her follow-up diabetic visit. She has type 2 diabetes mellitus. Hypoglycemia symptoms include nervousness/anxiousness. Associated symptoms include fatigue. Pertinent negatives for diabetes include no blurred vision. Symptoms are stable. Pertinent negatives for diabetic complications include no heart disease or peripheral neuropathy. Risk factors for coronary artery disease include diabetes mellitus, dyslipidemia,  hypertension, sedentary lifestyle and post-menopausal. She is following a generally healthy diet. (Does not check BS at home)      Review of Systems  Constitutional:  Positive for fatigue and irritability.  Eyes:  Negative for blurred vision.  Psychiatric/Behavioral:  Positive for depression. The patient is nervous/anxious.   All other systems reviewed and are negative.      Objective:   Physical Exam Vitals reviewed.  Constitutional:      General: She is not in acute distress.    Appearance: She is well-developed. She is obese.  HENT:     Head: Normocephalic and atraumatic.     Right Ear: Tympanic membrane normal.     Left Ear: Tympanic membrane normal.  Eyes:     Pupils: Pupils are equal, round, and reactive to light.  Neck:     Thyroid: No thyromegaly.  Cardiovascular:     Rate and Rhythm: Normal rate and regular rhythm.     Heart sounds: Normal heart sounds. No murmur heard. Pulmonary:     Effort: Pulmonary effort is normal. No respiratory distress.     Breath sounds: Normal breath sounds. No wheezing.  Abdominal:     General: Bowel sounds are normal. There is no distension.     Palpations: Abdomen is soft.     Tenderness: There is no abdominal tenderness.  Musculoskeletal:        General: No tenderness. Normal range of motion.     Cervical back: Normal range of motion and neck supple.  Skin:    General: Skin is warm and dry.  Neurological:     Mental Status: She is alert and oriented to person, place, and time.     Cranial Nerves: No  cranial nerve deficit.     Deep Tendon Reflexes: Reflexes are normal and symmetric.  Psychiatric:        Behavior: Behavior normal.        Thought Content: Thought content normal.        Judgment: Judgment normal.       BP 113/63   Pulse 79   Temp 99.6 F (37.6 C)   Ht 5' 5.5" (1.664 m)   Wt 199 lb (90.3 kg)   LMP 01/11/2013   SpO2 98%   BMI 32.61 kg/m      Assessment & Plan:  Grace Kraft Chmiel comes in today with  chief complaint of Medical Management of Chronic Issues   Diagnosis and orders addressed:  1. Schizoaffective disorder, depressive type ([place]) - CMP14+EGFR - CBC with Differential/Platelet  2. Episode of recurrent major depressive disorder, unspecified depression episode severity ([place]) - CMP14+EGFR - CBC with Differential/Platelet  3. GAD (generalized anxiety disorder) - CMP14+EGFR - CBC with Differential/Platelet  4. Moderate mixed hyperlipidemia not requiring statin therapy - CMP14+EGFR - CBC with Differential/Platelet  5. Obesity (BMI 30-39.9) - CMP14+EGFR - CBC with Differential/Platelet  6. Vitamin D deficiency - CMP14+EGFR - CBC with Differential/Platelet  7. OSA (obstructive sleep apnea) - CMP14+EGFR - CBC with Differential/Platelet  8. Prediabetes - Bayer DCA Hb A1c Waived - CMP14+EGFR - CBC with Differential/Platelet - Microalbumin / creatinine urine ratio   Labs pending Health Maintenance reviewed Diet and exercise encouraged  Follow up plan: 6 months    Evelina Dun, FNP

## 2022-04-26 LAB — CMP14+EGFR
ALT: 12 IU/L (ref 0–32)
AST: 8 IU/L (ref 0–40)
Albumin/Globulin Ratio: 1.5 (ref 1.2–2.2)
Albumin: 4.5 g/dL (ref 3.8–4.9)
Alkaline Phosphatase: 114 IU/L (ref 44–121)
BUN/Creatinine Ratio: 13 (ref 9–23)
BUN: 10 mg/dL (ref 6–24)
Bilirubin Total: 0.5 mg/dL (ref 0.0–1.2)
CO2: 25 mmol/L (ref 20–29)
Calcium: 9.9 mg/dL (ref 8.7–10.2)
Chloride: 104 mmol/L (ref 96–106)
Creatinine, Ser: 0.75 mg/dL (ref 0.57–1.00)
Globulin, Total: 3 g/dL (ref 1.5–4.5)
Glucose: 87 mg/dL (ref 70–99)
Potassium: 3.8 mmol/L (ref 3.5–5.2)
Sodium: 142 mmol/L (ref 134–144)
Total Protein: 7.5 g/dL (ref 6.0–8.5)
eGFR: 93 mL/min/{1.73_m2} (ref >59)

## 2022-04-26 LAB — CBC WITH DIFFERENTIAL/PLATELET
Basophils Absolute: 0 10*3/uL (ref 0.0–0.2)
Basos: 0 %
EOS (ABSOLUTE): 0.3 10*3/uL (ref 0.0–0.4)
Eos: 5 %
Hematocrit: 35.6 % (ref 34.0–46.6)
Hemoglobin: 11.7 g/dL (ref 11.1–15.9)
Immature Grans (Abs): 0 10*3/uL (ref 0.0–0.1)
Immature Granulocytes: 0 %
Lymphocytes Absolute: 2.4 10*3/uL (ref 0.7–3.1)
Lymphs: 51 %
MCH: 28.4 pg (ref 26.6–33.0)
MCHC: 32.9 g/dL (ref 31.5–35.7)
MCV: 86 fL (ref 79–97)
Monocytes Absolute: 0.3 10*3/uL (ref 0.1–0.9)
Monocytes: 6 %
Neutrophils Absolute: 1.8 10*3/uL (ref 1.4–7.0)
Neutrophils: 38 %
Platelets: 364 10*3/uL (ref 150–450)
RBC: 4.12 x10E6/uL (ref 3.77–5.28)
RDW: 13.7 % (ref 11.7–15.4)
WBC: 4.8 10*3/uL (ref 3.4–10.8)

## 2022-04-26 LAB — MICROALBUMIN / CREATININE URINE RATIO
Creatinine, Urine: 224.6 mg/dL
Microalb/Creat Ratio: 10 mg/g creat (ref 0–29)
Microalbumin, Urine: 23.1 ug/mL

## 2022-04-29 ENCOUNTER — Ambulatory Visit
Admission: RE | Admit: 2022-04-29 | Discharge: 2022-04-29 | Disposition: A | Discharge status: Home / Self Care | Payer: Medicare Other | Source: Ambulatory Visit | Attending: Pulmonary Disease | Admitting: Pulmonary Disease

## 2022-04-29 DIAGNOSIS — E042 Nontoxic multinodular goiter: Secondary | ICD-10-CM

## 2022-05-30 ENCOUNTER — Ambulatory Visit: Payer: Medicare Other | Admitting: Pulmonary Disease

## 2022-05-30 ENCOUNTER — Encounter: Payer: Self-pay | Admitting: Pulmonary Disease

## 2022-05-30 VITALS — BP 94/58 | HR 69 | Temp 98.4°F | Ht 66.0 in | Wt 198.6 lb

## 2022-05-30 DIAGNOSIS — G4733 Obstructive sleep apnea (adult) (pediatric): Secondary | ICD-10-CM | POA: Diagnosis not present

## 2022-05-30 DIAGNOSIS — R911 Solitary pulmonary nodule: Secondary | ICD-10-CM | POA: Diagnosis not present

## 2022-05-30 NOTE — Patient Instructions (Signed)
  X Home sleep test  [facility]ire booklet  X [facility] referral - [place] [facility]

## 2022-05-30 NOTE — Progress Notes (Signed)
   Subjective:    Patient ID: Grace Barajas, female    DOB: [birth year], 56 y.o.   MRN: [id number]  HPI  56 yo never smoker for follow-up of moderate OSA and right lower lobe nodule    58m RLL nodule Noted incidentally on CT scan when she had community-acquired pneumonia in 2018 This has gradually grown in size dotatate PET scan was turned down by insurance 2021 in spite of peer-to-peer review, [facility] recommended resection  PMH -multinodular goiter Schizoaffective disorder   Annual follow-up visit She reports being more compliant with her CPAP machine, denies daytime somnolence.  She would like to discuss alternative options. She is maintained on auto settings 8 to 15 cm.  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 No definitive worrisomenew or enlarging thyroid nodules.  Previously biopsied nodule within the right lobe of the thyroid (labeled 2), is grossly unchanged compared to the 11/2016 examination Left mid nodule >> 1 yr FU advised     10/2016-CT angiogram showed  a right lower lobe 10 mm nodule. There were 2 thyroid nodules 10 mm calcified left lobe and 8 mm noncalcified left lobe and possible adrenal nodule was noted measuring 1.1 cm in the left.   NPSG 06/2014:  AHI 15/hr, desat 89% 06/2015 AHI 16   Spirometry 08/2016 was a poor effort but showed normal lung function without airway obstruction  Review of Systems neg for any significant sore throat, dysphagia, itching, sneezing, nasal congestion or excess/ purulent secretions, fever, chills, sweats, unintended wt loss,  pleuritic or exertional cp, hempoptysis, orthopnea pnd or change in chronic leg swelling. Also denies presyncope, palpitations, heartburn, abdominal pain, nausea, vomiting, diarrhea or change in bowel or urinary habits, dysuria,hematuria, rash, arthralgias, visual complaints, headache, numbness weakness or ataxia.     Objective:   Physical Exam  Gen. Pleasant, obese, in no distress ENT - no lesions, no post nasal drip Neck: No JVD, no thyromegaly, no carotid bruits Lungs: no use of accessory muscles, no dullness to percussion, decreased without rales or rhonchi  Cardiovascular: Rhythm regular, heart sounds  normal, no murmurs or gallops, no peripheral edema Musculoskeletal: No deformities, no cyanosis or clubbing , no tremors       Assessment & Plan:

## 2022-05-30 NOTE — Assessment & Plan Note (Signed)
This nodule has doubled in size since 2018.  It seems to grow by 1 to 2 mm every year.  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  She has been seen by cardiothoracic surgery and we will refer her again for resection.  We will go ahead and schedule PFTs in anticipation

## 2022-05-30 NOTE — Assessment & Plan Note (Signed)
She is more compliant with CPAP now, on auto settings 8 to 15 cm. CPAP is only helped improve her daytime somnolence and fatigue.  We discussed alternative options including hypoglossal nerve stimulator implant.  We will reassess with home sleep test to see if there is worsening  Weight loss encouraged, compliance with goal of at least 4-6 hrs every night is the expectation. Advised against medications with sedative side effects Cautioned against driving when sleepy - understanding that sleepiness will vary on a day to day basis

## 2022-06-15 ENCOUNTER — Other Ambulatory Visit: Payer: Self-pay | Admitting: Family

## 2022-06-25 ENCOUNTER — Telehealth: Payer: Self-pay | Admitting: Family

## 2022-06-26 NOTE — Telephone Encounter (Signed)
Please let patient know that patient assistance process will take at least 4 weeks.  She did not have a formal appt, so I'm doing paperwork on the side.

## 2022-06-26 NOTE — Telephone Encounter (Signed)
Pt aware of process length - aware we will call her once her see/ hear anything

## 2022-07-09 ENCOUNTER — Ambulatory Visit: Payer: Medicare Other | Admitting: Thoracic Surgery (Cardiothoracic Vascular Surgery)

## 2022-07-09 ENCOUNTER — Encounter: Payer: Self-pay | Admitting: Thoracic Surgery (Cardiothoracic Vascular Surgery)

## 2022-07-09 VITALS — BP 103/63 | HR 77 | Resp 18 | Ht 66.0 in | Wt 199.0 lb

## 2022-07-09 DIAGNOSIS — R911 Solitary pulmonary nodule: Secondary | ICD-10-CM

## 2022-07-09 NOTE — Progress Notes (Signed)
[address]       [place] [address]             [phone]      HPI: [name] returns for follow-up of her right lower lobe lung nodule  Grace Barajas is a 56 year old woman with a history of obesity, sleep apnea, anxiety, depression, schizoaffective disorder, and reflux.  She is a lifelong non-smoker.  She was found to have a lung nodule in 2018.  It was 7 x 9 mm at that time.  Over time it had increased in size.  She had a PET/CT in 2020 which showed no metabolic activity.  She continued to be followed.  I saw her in August 2021 as the nodule had continued to increase in size.  I recommended resection but she opted not to do so.  She has been feeling well.  She is not having any unusual cough, wheezing, or shortness of breath.  Denies chest pain, pressure, and tightness.  Recently saw Dr. Elsworth Soho and he recommended she return for reconsideration of resection.  Past Medical History:  Diagnosis Date   Acute medial meniscus tear of left knee 09/2019   Allergy    SEASONAL   Anxiety    COVID 04-2020 or 05-2020   cough tired flu like symptoms x 7 days symptoms resolved   Depression    GERD (gastroesophageal reflux disease)    Heart murmur    mild no cardiologist   High cholesterol    Pneumonia 2018   Pre-diabetes    Sinus disorder    Sleep apnea    uses CPAP   Sleep apnea with use of continuous positive airway pressure (CPAP)    Snores    Wears glasses    Past Surgical History:  Procedure Laterality Date   BILATERAL SALPINGECTOMY Bilateral 01/21/2013   Procedure: BILATERAL SALPINGECTOMY;  Surgeon: Marvene Staff, MD;  Location: [facility];  Service: Gynecology;  Laterality: Bilateral;   CARPAL TUNNEL RELEASE Left 07/03/2007   CARPAL TUNNEL RELEASE Right 07/31/2007   CERVICAL FUSION  1993   C1/C2   COLONOSCOPY  06/26/2019   DORSAL COMPARTMENT RELEASE Bilateral 01/13/2014   Procedure: BILATERAL 1ST DORSAL COMPARTMENT RELEASES;  Surgeon: Cammie Sickle., MD;  Location: [facility];  Service: Orthopedics;  Laterality: Bilateral;  bilateral   JOINT REPLACEMENT     KNEE ARTHROSCOPY WITH SUBCHONDROPLASTY Left 10/05/2020   Procedure: Left knee arthroscopic partial medial meniscectomy with medial tibial subchondroplasty;  Surgeon: Nicholes Stairs, MD;  Location: [facility];  Service: Orthopedics;  Laterality: Left;  75 mins   left knee meniscus surgery  09/2019   [facility]   ROBOTIC ASSISTED TOTAL HYSTERECTOMY N/A 01/21/2013   Procedure: ROBOTIC ASSISTED TOTAL HYSTERECTOMY;  Surgeon: Marvene Staff, MD;  Location: [facility];  Service: Gynecology;  Laterality: N/A;   TUBAL LIGATION  yrs ago      Current Outpatient Medications  Medication Sig Dispense Refill   buPROPion (WELLBUTRIN XL) 300 MG 24 hr tablet Take 300 mg by mouth daily.     cetirizine (ZYRTEC) 10 MG chewable tablet Chew 10 mg by mouth daily.     FLUoxetine HCl (PROZAC PO) Take 80 mg by mouth daily.     methylcellulose (CITRUCEL) oral powder Take 1 tablespoon three times a day with meals 300 g 1   QUEtiapine (SEROQUEL) 100 MG tablet Take 100 mg by mouth at bedtime.  rosuvastatin (CRESTOR) 5 MG tablet Take 1 tablet by mouth once daily 90 tablet 0   Semaglutide, 1 MG/DOSE, 4 MG/3ML SOPN Inject 1 mg as directed once a week. (Patient not taking: Reported on 07/09/2022) 3 mL 2   VYVANSE 20 MG capsule Take 20 mg by mouth every morning. (Patient not taking: Reported on 07/09/2022)     No current facility-administered medications for this visit.    Physical Exam BP 103/63 (BP Location: Left Arm, Patient Position: Sitting)   Pulse 77   Resp 18   Ht '5\' 6"'$  (1.676 m)   Wt 199 lb (90.3 kg)   LMP 01/11/2013   SpO2 97% Comment: RA  BMI 32.30 kg/m  56 year old woman in no acute distress Well-developed well-nourished Alert and oriented x3 with no focal deficits Lungs clear bilaterally no rales or wheezing No cervical or  supraclavicular adenopathy Cardiac regular rate and rhythm normal S1 and S2 with faint systolic murmur No clubbing, cyanosis, or peripheral edema  Diagnostic Tests: CT CHEST WITHOUT CONTRAST   TECHNIQUE: Multidetector CT imaging of the chest was performed following the standard protocol without IV contrast.   RADIATION DOSE REDUCTION: This exam was performed according to the departmental dose-optimization program which includes automated exposure control, adjustment of the mA and/or kV according to patient size and/or use of iterative reconstruction technique.   COMPARISON:  CT chest, 04/21/2021, 04/17/2020, 09/30/2019, 06/28/2019, 11/10/2016 thyroid ultrasound, 05/23/2021   FINDINGS: Cardiovascular: No significant vascular findings. Normal heart size. No pericardial effusion.   Mediastinum/Nodes: No enlarged mediastinal, hilar, or axillary lymph nodes. Thymic remnant in the anterior mediastinum. Heterogeneous, nodular thyroid. Trachea, and esophagus demonstrate no significant findings.   Lungs/Pleura: Continued minimal interval growth of a nodule of the medial right lower lobe measuring 1.9 x 1.3 cm, previously 1.7 x 1.2 cm, and more clearly enlarged over a longer period of time dating back to 11/10/2016. No pleural effusion or pneumothorax.   Upper Abdomen: No acute abnormality. Stable, benign bilateral fat containing adrenal adenomata.   Musculoskeletal: No chest wall abnormality. No suspicious osseous lesions identified.   IMPRESSION: 1. Continued minimal interval growth of a nodule of the medial right lower lobe measuring 1.9 x 1.3 cm, previously 1.7 x 1.2 cm, and more clearly enlarged over a longer period of time dating back to 11/10/2016. Although very indolent, continued growth over time remains worrisome for slowly growing malignancy. 2. Heterogeneous, nodular thyroid, previously evaluated by dedicated thyroid ultrasound. This has been evaluated on previous  imaging. (ref: J Am Coll Radiol. 2015 Feb;12(2): 143-50).     Electronically Signed   By: Delanna Ahmadi M.D.   On: 04/18/2022 13:14 I personally reviewed the CT images.  The right lower lobe lung nodule continues to slowly increase in size.  Relatively smoothly marginated.  No adenopathy.  Impression: Grace Barajas is a 56 year old woman with a history of obesity, sleep apnea, anxiety, depression, schizoaffective disorder, and reflux.  Found to have a right lower lobe lung nodule in 2018.  Has been slowly growing in size.  Did have a PET a couple of years ago which showed no appreciable metabolic activity.  Differential diagnosis is hamartoma versus carcinoid tumor.  Time course is not consistent with a primary bronchogenic carcinoma.  I still recommend surgical resection as the nodule has slowly been getting bigger.  It is at the bifurcation of an airway and could potentially cause airway obstruction related to recurrent infections.  Obviously, given the time course this is not an urgent procedure.  I told her that she could take her time and make her decision without any time pressure.  The other option would be continued radiographic follow-up.  However this nodule has demonstrated that it is going to slowly grow over time.  I described the proposed operative procedure to Grace Barajas.  We would plan to do a robotic right VATS for lower lobe segmentectomy.  I informed her of the general nature of the procedure including the need for general anesthesia, the incisions to be used, the use of the surgical robot, use of a drainage tube postoperatively, the expected hospital stay, and the overall recovery.  I informed her of the indications, risk, benefits, and alternatives.  She understands the risks include, but not limited to death, MI, DVT, PE, bleeding, possible need for transfusion, infection, prolonged air leak, cardiac arrhythmias, as well as possibility of other unforeseeable  complications.  She understands the degree of pain is variable and unpredictable.  She is leaning towards proceeding with surgery but wants some time to think it over and will call us to schedule.  Plan: Robotic right VATS for lower lobe segmentectomy  Melrose Nakayama, MD [facility] [phone]

## 2022-07-24 ENCOUNTER — Telehealth: Payer: Self-pay | Admitting: Pharmacist

## 2022-07-24 NOTE — Telephone Encounter (Signed)
Ozempic '1mg'$  weekly #4 boxes novo patient assistance Ready for pick up Patient notified

## 2022-07-31 ENCOUNTER — Encounter: Payer: Self-pay | Admitting: Pulmonary Disease

## 2022-08-15 ENCOUNTER — Ambulatory Visit: Payer: Medicare Other

## 2022-08-15 DIAGNOSIS — G4733 Obstructive sleep apnea (adult) (pediatric): Secondary | ICD-10-CM | POA: Diagnosis not present

## 2022-08-19 ENCOUNTER — Other Ambulatory Visit: Payer: Self-pay | Admitting: *Deleted

## 2022-08-19 ENCOUNTER — Encounter: Payer: Self-pay | Admitting: *Deleted

## 2022-08-19 DIAGNOSIS — R911 Solitary pulmonary nodule: Secondary | ICD-10-CM

## 2022-08-22 NOTE — Pre-Procedure Instructions (Signed)
Surgical Instructions    Your procedure is scheduled on August 26, 2022.  Report to [facility] Main Entrance "A" at 9:50 A.M., then check in with the Admitting office.  Call this number if you have problems the morning of surgery:  [phone]   If you have any questions prior to your surgery date call [phone]: Open Monday-Friday 8am-4pm    Remember:  Do not eat or drink after midnight the night before your surgery     Take these medicines the morning of surgery with A SIP OF WATER:  buPROPion (WELLBUTRIN XL)   cetirizine (ZYRTEC)   FLUoxetine (PROZAC)   rosuvastatin (CRESTOR)     STOP taking your Semaglutide weekly injection one week prior to your surgery.   As of today, STOP taking any Aspirin (unless otherwise instructed by your surgeon) Aleve, Naproxen, Ibuprofen, Motrin, Advil, Goody's, BC's, all herbal medications, fish oil, and all vitamins.                     Do NOT Smoke (Tobacco/Vaping) for 24 hours prior to your procedure.  If you use a CPAP at night, you may bring your mask/headgear for your overnight stay.   Contacts, glasses, piercing's, hearing aid's, dentures or partials may not be worn into surgery, please bring cases for these belongings.    For patients admitted to the hospital, discharge time will be determined by your treatment team.   Patients discharged the day of surgery will not be allowed to drive home, and someone needs to stay with them for 24 hours.  SURGICAL WAITING ROOM VISITATION Patients having surgery or a procedure may have no more than 2 support people in the waiting area - these visitors may rotate.   Children under the age of 24 must have an adult with them who is not the patient. If the patient needs to stay at the hospital during part of their recovery, the visitor guidelines for inpatient rooms apply. Pre-op nurse will coordinate an appropriate time for 1 support person to accompany patient in pre-op.  This support person  may not rotate.   Please refer to the [facility] website for the visitor guidelines for Inpatients (after your surgery is over and you are in a regular room).    Special instructions:   [place]- Preparing For Surgery  Before surgery, you can play an important role. Because skin is not sterile, your skin needs to be as free of germs as possible. You can reduce the number of germs on your skin by washing with CHG (chlorahexidine gluconate) Soap before surgery.  CHG is an antiseptic cleaner which kills germs and bonds with the skin to continue killing germs even after washing.    Oral Hygiene is also important to reduce your risk of infection.  Remember - BRUSH YOUR TEETH THE MORNING OF SURGERY WITH YOUR REGULAR TOOTHPASTE  Please do not use if you have an allergy to CHG or antibacterial soaps. If your skin becomes reddened/irritated stop using the CHG.  Do not shave (including legs and underarms) for at least 48 hours prior to first CHG shower. It is OK to shave your face.  Please follow these instructions carefully.   Shower the NIGHT BEFORE SURGERY and the MORNING OF SURGERY  If you chose to wash your hair, wash your hair first as usual with your normal shampoo.  After you shampoo, rinse your hair and body thoroughly to remove the shampoo.  Use CHG Soap as you would any  other liquid soap. You can apply CHG directly to the skin and wash gently with a scrungie or a clean washcloth.   Apply the CHG Soap to your body ONLY FROM THE NECK DOWN.  Do not use on open wounds or open sores. Avoid contact with your eyes, ears, mouth and genitals (private parts). Wash Face and genitals (private parts)  with your normal soap.   Wash thoroughly, paying special attention to the area where your surgery will be performed.  Thoroughly rinse your body with warm water from the neck down.  DO NOT shower/wash with your normal soap after using and rinsing off the CHG Soap.  Pat yourself dry with a CLEAN  TOWEL.  Wear CLEAN PAJAMAS to bed the night before surgery  Place CLEAN SHEETS on your bed the night before your surgery  DO NOT SLEEP WITH PETS.   Day of Surgery: Take a shower with CHG soap. Do not wear jewelry or makeup Do not wear lotions, powders, perfumes, or deodorant. Do not shave 48 hours prior to surgery.   Do not bring valuables to the hospital.  [facility] is not responsible for any belongings or valuables. Do not wear nail polish, gel polish, artificial nails, or any other type of covering on natural nails (fingers and toes) If you have artificial nails or gel coating that need to be removed by a nail salon, please have this removed prior to surgery. Artificial nails or gel coating may interfere with anesthesia's ability to adequately monitor your vital signs.  Wear Clean/Comfortable clothing the morning of surgery Remember to brush your teeth WITH YOUR REGULAR TOOTHPASTE.   Please read over the following fact sheets that you were given.    If you received a COVID test during your pre-op visit  it is requested that you wear a mask when out in public, stay away from anyone that may not be feeling well and notify your surgeon if you develop symptoms. If you have been in contact with anyone that has tested positive in the last 10 days please notify you surgeon.

## 2022-08-23 ENCOUNTER — Other Ambulatory Visit (HOSPITAL_COMMUNITY): Payer: Medicare Other

## 2022-08-23 ENCOUNTER — Inpatient Hospital Stay (HOSPITAL_COMMUNITY)
Admission: RE | Admit: 2022-08-23 | Discharge: 2022-08-23 | Disposition: A | Discharge status: Home / Self Care | Payer: Medicare Other | Source: Ambulatory Visit

## 2022-08-23 ENCOUNTER — Inpatient Hospital Stay (HOSPITAL_COMMUNITY): Admission: RE | Admit: 2022-08-23 | Payer: Medicare Other | Source: Ambulatory Visit

## 2022-08-23 NOTE — Progress Notes (Signed)
Pt did not show for PAT appt. Spoke with pt, she said she has cancelled her surgery and does not plan to reschedule until the Spring of next year.

## 2022-08-26 ENCOUNTER — Inpatient Hospital Stay (HOSPITAL_COMMUNITY)
Admission: RE | Admit: 2022-08-26 | Payer: Medicare Other | Source: Home / Self Care | Admitting: Thoracic Surgery (Cardiothoracic Vascular Surgery)

## 2022-08-26 ENCOUNTER — Encounter (HOSPITAL_COMMUNITY): Admission: RE | Payer: Self-pay | Source: Home / Self Care

## 2022-08-26 SURGERY — RESECTION, LUNG, SEGMENTAL, ROBOT-ASSISTED
Anesthesia: General | Site: Chest | Laterality: Right

## 2022-08-27 ENCOUNTER — Telehealth: Payer: Self-pay | Admitting: Pulmonary Disease

## 2022-08-27 DIAGNOSIS — G4733 Obstructive sleep apnea (adult) (pediatric): Secondary | ICD-10-CM | POA: Diagnosis not present

## 2022-08-27 NOTE — Telephone Encounter (Signed)
HST showed mild OSA with AHI 9/ hr and lowest saturation of 81% Suggest continue CPAP. Alternative would be dental appliance  She is not a candidate for implant  Office visit with APP in 1 month

## 2022-08-28 NOTE — Telephone Encounter (Signed)
Spoke with pt and reviewed HST results as dictated by Dr. Elsworth Soho. Pt stated understanding and that she would call office back to schedule OV with NP. Nothing further needed at this time.

## 2022-08-28 NOTE — Telephone Encounter (Signed)
LMOM for Pt to call back for results to HST.

## 2022-10-01 DIAGNOSIS — H2513 Age-related nuclear cataract, bilateral: Secondary | ICD-10-CM | POA: Diagnosis not present

## 2022-10-29 ENCOUNTER — Telehealth: Payer: Self-pay | Admitting: Family

## 2022-10-30 ENCOUNTER — Ambulatory Visit (INDEPENDENT_AMBULATORY_CARE_PROVIDER_SITE_OTHER): Payer: Medicare Other

## 2022-10-30 VITALS — Ht 66.0 in | Wt 195.0 lb

## 2022-10-30 DIAGNOSIS — Z1231 Encounter for screening mammogram for malignant neoplasm of breast: Secondary | ICD-10-CM

## 2022-10-30 DIAGNOSIS — Z0000 Encounter for general adult medical examination without abnormal findings: Secondary | ICD-10-CM | POA: Diagnosis not present

## 2022-10-30 NOTE — Patient Instructions (Signed)
Grace Barajas , Thank you for taking time to come for your Medicare Wellness Visit. I appreciate your ongoing commitment to your health goals. Please review the following plan we discussed and let me know if I can assist you in the future.   These are the goals we discussed:  Goals      awv     09/20/2019 AWV Goal: Exercise for General Health  Patient will verbalize understanding of the benefits of increased physical activity: Exercising regularly is important. It will improve your overall fitness, flexibility, and endurance. Regular exercise also will improve your overall health. It can help you control your weight, reduce stress, and improve your bone density. Over the next year, patient will increase physical activity as tolerated with a goal of at least 150 minutes of moderate physical activity per week.  You can tell that you are exercising at a moderate intensity if your heart starts beating faster and you start breathing faster but can still hold a conversation. Moderate-intensity exercise ideas include: Walking 1 mile (1.6 km) in about 15 minutes Biking Hiking Golfing Dancing Water aerobics Patient will verbalize understanding of everyday activities that increase physical activity by providing examples like the following: Yard work, such as: Sales promotion account executive Gardening Washing windows or floors Patient will be able to explain general safety guidelines for exercising:  Before you start a new exercise program, talk with your health care provider. Do not exercise so much that you hurt yourself, feel dizzy, or get very short of breath. Wear comfortable clothes and wear shoes with good support. Drink plenty of water while you exercise to prevent dehydration or heat stroke. Work out until your breathing and your heartbeat get faster.      Exercise 150 min/wk Moderate Activity        This is a  list of the screening recommended for you and due dates:  Health Maintenance  Topic Date Due   Eye exam for diabetics  06/11/2015   Mammogram  09/21/2020   Hemoglobin A1C  10/25/2022   COVID-19 Vaccine (2 - Pfizer risk series) 11/15/2022*   Flu Shot  01/26/2023*   Yearly kidney function blood test for diabetes  04/26/2023   Yearly kidney health urinalysis for diabetes  04/26/2023   Complete foot exam   04/26/2023   Medicare Annual Wellness Visit  10/31/2023   Pap Smear  03/08/2024   Colon Cancer Screening  12/21/2024   Hepatitis C Screening: USPSTF Recommendation to screen - Ages 18-79 yo.  Completed   HIV Screening  Completed   Zoster (Shingles) Vaccine  Completed   HPV Vaccine  Aged Out   DTaP/Tdap/Td vaccine  Discontinued  *Topic was postponed. The date shown is not the original due date.    Advanced directives: Forms are available if you choose in the future to pursue completion.  This is recommended in order to make sure that your health wishes are honored in the event that you are unable to verbalize them to the provider.    Conditions/risks identified: Aim for 30 minutes of exercise or brisk walking, 6-8 glasses of water, and 5 servings of fruits and vegetables each day.   Next appointment: Follow up in one year for your annual wellness visit.   The number to schedule your mammogram with [facility] is [phone]  Preventive Care 40-64 Years, Female Preventive care refers to lifestyle choices and visits with your health  care provider that can promote health and wellness. What does preventive care include? A yearly physical exam. This is also called an annual well check. Dental exams once or twice a year. Routine eye exams. Ask your health care provider how often you should have your eyes checked. Personal lifestyle choices, including: Daily care of your teeth and gums. Regular physical activity. Eating a healthy diet. Avoiding tobacco and drug use. Limiting  alcohol use. Practicing safe sex. Taking low-dose aspirin daily starting at age 48. Taking vitamin and mineral supplements as recommended by your health care provider. What happens during an annual well check? The services and screenings done by your health care provider during your annual well check will depend on your age, overall health, lifestyle risk factors, and family history of disease. Counseling  Your health care provider may ask you questions about your: Alcohol use. Tobacco use. Drug use. Emotional well-being. Home and relationship well-being. Sexual activity. Eating habits. Work and work Statistician. Method of birth control. Menstrual cycle. Pregnancy history. Screening  You may have the following tests or measurements: Height, weight, and BMI. Blood pressure. Lipid and cholesterol levels. These may be checked every 5 years, or more frequently if you are over 34 years old. Skin check. Lung cancer screening. You may have this screening every year starting at age 28 if you have a 30-pack-year history of smoking and currently smoke or have quit within the past 15 years. Fecal occult blood test (FOBT) of the stool. You may have this test every year starting at age 57. Flexible sigmoidoscopy or colonoscopy. You may have a sigmoidoscopy every 5 years or a colonoscopy every 10 years starting at age 18. Hepatitis C blood test. Hepatitis B blood test. Sexually transmitted disease (STD) testing. Diabetes screening. This is done by checking your blood sugar (glucose) after you have not eaten for a while (fasting). You may have this done every 1-3 years. Mammogram. This may be done every 1-2 years. Talk to your health care provider about when you should start having regular mammograms. This may depend on whether you have a family history of breast cancer. BRCA-related cancer screening. This may be done if you have a family history of breast, ovarian, tubal, or peritoneal  cancers. Pelvic exam and Pap test. This may be done every 3 years starting at age 56. Starting at age 35, this may be done every 5 years if you have a Pap test in combination with an HPV test. Bone density scan. This is done to screen for osteoporosis. You may have this scan if you are at high risk for osteoporosis. Discuss your test results, treatment options, and if necessary, the need for more tests with your health care provider. Vaccines  Your health care provider may recommend certain vaccines, such as: Influenza vaccine. This is recommended every year. Tetanus, diphtheria, and acellular pertussis (Tdap, Td) vaccine. You may need a Td booster every 10 years. Zoster vaccine. You may need this after age 27. Pneumococcal 13-valent conjugate (PCV13) vaccine. You may need this if you have certain conditions and were not previously vaccinated. Pneumococcal polysaccharide (PPSV23) vaccine. You may need one or two doses if you smoke cigarettes or if you have certain conditions. Talk to your health care provider about which screenings and vaccines you need and how often you need them. This information is not intended to replace advice given to you by your health care provider. Make sure you discuss any questions you have with your health care provider. Document  Released: 11/10/2015 Document Revised: 07/03/2016 Document Reviewed: 08/15/2015 Elsevier Interactive Patient Education  2017 [place] Prevention in the Home Falls can cause injuries. They can happen to people of all ages. There are many things you can do to make your home safe and to help prevent falls. What can I do on the outside of my home? Regularly fix the edges of walkways and driveways and fix any cracks. Remove anything that might make you trip as you walk through a door, such as a raised step or threshold. Trim any bushes or trees on the path to your home. Use bright outdoor lighting. Clear any walking paths of  anything that might make someone trip, such as rocks or tools. Regularly check to see if handrails are loose or broken. Make sure that both sides of any steps have handrails. Any raised decks and porches should have guardrails on the edges. Have any leaves, snow, or ice cleared regularly. Use sand or salt on walking paths during winter. Clean up any spills in your garage right away. This includes oil or grease spills. What can I do in the bathroom? Use night lights. Install grab bars by the toilet and in the tub and shower. Do not use towel bars as grab bars. Use non-skid mats or decals in the tub or shower. If you need to sit down in the shower, use a plastic, non-slip stool. Keep the floor dry. Clean up any water that spills on the floor as soon as it happens. Remove soap buildup in the tub or shower regularly. Attach bath mats securely with double-sided non-slip rug tape. Do not have throw rugs and other things on the floor that can make you trip. What can I do in the bedroom? Use night lights. Make sure that you have a light by your bed that is easy to reach. Do not use any sheets or blankets that are too big for your bed. They should not hang down onto the floor. Have a firm chair that has side arms. You can use this for support while you get dressed. Do not have throw rugs and other things on the floor that can make you trip. What can I do in the kitchen? Clean up any spills right away. Avoid walking on wet floors. Keep items that you use a lot in easy-to-reach places. If you need to reach something above you, use a strong step stool that has a grab bar. Keep electrical cords out of the way. Do not use floor polish or wax that makes floors slippery. If you must use wax, use non-skid floor wax. Do not have throw rugs and other things on the floor that can make you trip. What can I do with my stairs? Do not leave any items on the stairs. Make sure that there are handrails on both  sides of the stairs and use them. Fix handrails that are broken or loose. Make sure that handrails are as long as the stairways. Check any carpeting to make sure that it is firmly attached to the stairs. Fix any carpet that is loose or worn. Avoid having throw rugs at the top or bottom of the stairs. If you do have throw rugs, attach them to the floor with carpet tape. Make sure that you have a light switch at the top of the stairs and the bottom of the stairs. If you do not have them, ask someone to add them for you. What else can I do  to help prevent falls? Wear shoes that: Do not have high heels. Have rubber bottoms. Are comfortable and fit you well. Are closed at the toe. Do not wear sandals. If you use a stepladder: Make sure that it is fully opened. Do not climb a closed stepladder. Make sure that both sides of the stepladder are locked into place. Ask someone to hold it for you, if possible. Clearly mark and make sure that you can see: Any grab bars or handrails. First and last steps. Where the edge of each step is. Use tools that help you move around (mobility aids) if they are needed. These include: Canes. Walkers. Scooters. Crutches. Turn on the lights when you go into a dark area. Replace any light bulbs as soon as they burn out. Set up your furniture so you have a clear path. Avoid moving your furniture around. If any of your floors are uneven, fix them. If there are any pets around you, be aware of where they are. Review your medicines with your doctor. Some medicines can make you feel dizzy. This can increase your chance of falling. Ask your doctor what other things that you can do to help prevent falls. This information is not intended to replace advice given to you by your health care provider. Make sure you discuss any questions you have with your health care provider. Document Released: 08/10/2009 Document Revised: 03/21/2016 Document Reviewed: 11/18/2014 Elsevier  Interactive Patient Education  2017 [employer].

## 2022-10-30 NOTE — Progress Notes (Signed)
Subjective:   Grace Barajas is a 57 y.o. female who presents for Medicare Annual (Subsequent) preventive examination.  I connected with  Grace Barajas on 10/30/22 by a audio enabled telemedicine application and verified that I am speaking with the correct person using two identifiers.  Patient Location: Home  Provider Location: Office/Clinic  I discussed the limitations of evaluation and management by telemedicine. The patient expressed understanding and agreed to proceed.  Review of Systems     Cardiac Risk Factors include: dyslipidemia;hypertension;obesity (BMI >30kg/m2)     Objective:    Today's Vitals   10/30/22 1455  Weight: 195 lb (88.5 kg)  Height: _0  (1.676 m)   Body mass index is 31.47 kg/m.     10/30/2022    3:03 PM 10/26/2021    2:04 PM 10/05/2020    9:47 AM 10/01/2019   10:38 AM 09/20/2019    2:38 PM 02/11/2018   11:05 AM 11/27/2016    2:27 PM  Advanced Directives  Does Patient Have a Medical Advance Directive? No Yes Yes Yes Yes No Yes  Type of Advance Directive  Living will [place];Living will  Healthcare Power of [place];Living will  Does patient want to make changes to medical advance directive?   No - Patient declined No - Patient declined     Copy of [place] in Chart?   No - copy requested  No - copy requested  No - copy requested  Would patient like information on creating a medical advance directive? No - Patient declined No - Patient declined    No - Patient declined     Current Medications (verified) Outpatient Encounter Medications as of 10/30/2022  Medication Sig   buPROPion (WELLBUTRIN XL) 300 MG 24 hr tablet Take 300 mg by mouth daily.   cetirizine (ZYRTEC) 10 MG tablet Take 10 mg by mouth daily.   Cholecalciferol (VITAMIN D) 125 MCG (5000 UT) CAPS Take 5,000 Units by mouth daily.   Coenzyme Q10 200 MG TABS Take 200 mg by mouth daily.   FLUoxetine (PROZAC) 40  MG capsule Take 80 mg by mouth daily.   folic acid (FOLVITE) 314 MCG tablet Take 800 mcg by mouth daily.   Glucosamine-Chondroitin (OSTEO BI-FLEX REGULAR STRENGTH PO) Take 2 tablets by mouth daily.   methylcellulose (CITRUCEL) oral powder Take 1 tablespoon three times a day with meals   Omega-3 Fatty Acids (FISH OIL) 1200 MG CAPS Take 1,200 mg by mouth daily.   rosuvastatin (CRESTOR) 5 MG tablet Take 1 tablet by mouth once daily   Specialty Vitamins Products (BIOTIN PLUS KERATIN PO) Take 10,000 mcg by mouth daily.   VYVANSE 20 MG capsule Take 20 mg by mouth every morning.   ZINC OXIDE-VITAMIN C PO Take 1 tablet by mouth daily.   Semaglutide, 1 MG/DOSE, 4 MG/3ML SOPN Inject 1 mg as directed once a week. (Patient not taking: Reported on 10/30/2022)   No facility-administered encounter medications on file as of 10/30/2022.    Allergies (verified) Morphine and related   History: Past Medical History:  Diagnosis Date   Acute medial meniscus tear of left knee 09/2019   Allergy    SEASONAL   Anxiety    COVID 04-2020 or 05-2020   cough tired flu like symptoms x 7 days symptoms resolved   Depression    GERD (gastroesophageal reflux disease)    Heart murmur    mild no cardiologist   High  cholesterol    Pneumonia 2018   Pre-diabetes    Sinus disorder    Sleep apnea    uses CPAP   Sleep apnea with use of continuous positive airway pressure (CPAP)    Snores    Wears glasses    Past Surgical History:  Procedure Laterality Date   BILATERAL SALPINGECTOMY Bilateral 01/21/2013   Procedure: BILATERAL SALPINGECTOMY;  Surgeon: Marvene Staff, MD;  Location: [facility];  Service: Gynecology;  Laterality: Bilateral;   CARPAL TUNNEL RELEASE Left 07/03/2007   CARPAL TUNNEL RELEASE Right 07/31/2007   CERVICAL FUSION  1993   C1/C2   COLONOSCOPY  06/26/2019   DORSAL COMPARTMENT RELEASE Bilateral 01/13/2014   Procedure: BILATERAL 1ST DORSAL COMPARTMENT RELEASES;  Surgeon: Cammie Sickle., MD;   Location: [facility];  Service: Orthopedics;  Laterality: Bilateral;  bilateral   JOINT REPLACEMENT     KNEE ARTHROSCOPY WITH SUBCHONDROPLASTY Left 10/05/2020   Procedure: Left knee arthroscopic partial medial meniscectomy with medial tibial subchondroplasty;  Surgeon: Nicholes Stairs, MD;  Location: [facility];  Service: Orthopedics;  Laterality: Left;  75 mins   left knee meniscus surgery  09/2019   [facility] [place]   ROBOTIC ASSISTED TOTAL HYSTERECTOMY N/A 01/21/2013   Procedure: ROBOTIC ASSISTED TOTAL HYSTERECTOMY;  Surgeon: Marvene Staff, MD;  Location: [facility];  Service: Gynecology;  Laterality: N/A;   TUBAL LIGATION  yrs ago   Family History  Problem Relation Age of Onset   Asthma Son    Asthma Brother    Obesity Father    Diabetes Father    Heart disease Father    Hyperlipidemia Father    Diabetes Maternal Grandmother    Diabetes Maternal Aunt    Diabetes Maternal Uncle    Colon polyps Maternal Uncle    Thyroid disease Neg Hx    Breast cancer Neg Hx    Colon cancer Neg Hx    Esophageal cancer Neg Hx    Rectal cancer Neg Hx    Stomach cancer Neg Hx    Social History   Socioeconomic History   Marital status: Single    Spouse name: Not on file   Number of children: 3   Years of education: Not on file   Highest education level: Bachelor's degree (e.g., BA, AB, BS)  Occupational History   Occupation: disabled   Occupation: Ship broker  Tobacco Use   Smoking status: Never   Smokeless tobacco: Never  Vaping Use   Vaping Use: Never used  Substance and Sexual Activity   Alcohol use: Not Currently    Comment: rare   Drug use: No   Sexual activity: Not on file  Other Topics Concern   Not on file  Social History Narrative   Not on file   Social Determinants of Health   Financial Resource Strain: Low Risk  (10/30/2022)   Overall Financial Resource Strain (CARDIA)    Difficulty of Paying Living Expenses: Not hard at  all  Food Insecurity: No Food Insecurity (10/30/2022)   Hunger Vital Sign    Worried About Running Out of Food in the Last Year: Never true    Ran Out of Food in the Last Year: Never true  Transportation Needs: No Transportation Needs (10/30/2022)   PRAPARE - Hydrologist (Medical): No    Lack of Transportation (Non-Medical): No  Physical Activity: Unknown (10/30/2022)   Exercise Vital Sign    Days of Exercise per  Week: 0 days    Minutes of Exercise per Session: Not on file  Stress: Stress Concern Present (10/30/2022)   [place]    Feeling of Stress : To some extent  Social Connections: Moderately Isolated (10/30/2022)   Social Connection and Isolation Panel [NHANES]    Frequency of Communication with Friends and Family: More than three times a week    Frequency of Social Gatherings with Friends and Family: Three times a week    Attends Religious Services: More than 4 times per year    Active Member of Clubs or Organizations: No    Attends Archivist Meetings: Never    Marital Status: Never married    Tobacco Counseling Counseling given: Not Answered   Clinical Intake:  Pre-visit preparation completed: Yes  Pain : No/denies pain  Diabetes: Yes CBG done?: No Did pt. bring in CBG monitor from home?: No  How often do you need to have someone help you when you read instructions, pamphlets, or other written materials from your doctor or pharmacy?: 1 - Never  Diabetic?Yes  Nutrition Risk Assessment:  Has the patient had any N/V/D within the last 2 months?  No  Does the patient have any non-healing wounds?  No  Has the patient had any unintentional weight loss or weight gain?  No   Diabetes:  Is the patient diabetic?  Yes  If diabetic, was a CBG obtained today?  No  Did the patient bring in their glucometer from home?  No  How often do you monitor your CBG's? As needed .    Financial Strains and Diabetes Management:  Are you having any financial strains with the device, your supplies or your medication? No .  Does the patient want to be seen by Chronic Care Management for management of their diabetes?  No  Would the patient like to be referred to a Nutritionist or for Diabetic Management?  No   Diabetic Exams:  Diabetic Eye Exam: Completed with [facility]; will request notes  Diabetic Foot Exam: Completed 04/25/22  Interpreter Needed?: No  Information entered by :: Denman George LPN   Activities of Daily Living    10/30/2022    3:03 PM  In your present state of health, do you have any difficulty performing the following activities:  Hearing? 0  Vision? 0  Difficulty concentrating or making decisions? 0  Walking or climbing stairs? 0  Dressing or bathing? 0  Doing errands, shopping? 0  Preparing Food and eating ? N  Using the Toilet? N  In the past six months, have you accidently leaked urine? N  Do you have problems with loss of bowel control? N  Managing your Medications? N  Managing your Finances? N  Housekeeping or managing your Housekeeping? N    Patient Care Team: Sharion Balloon, FNP as PCP - General (Nurse Practitioner) Rigoberto Noel, MD as Consulting Physician (Pulmonary Disease) Marilynne Halsted, MD as Referring Physician (Ophthalmology) Wendie Chess, Chrystie Nose, MD (Otolaryngology) Nyoka Cowden Shella Maxim, MD as Referring Physician (Psychiatry)  Indicate any recent Medical Services you may have received from other than [facility] providers in the past year (date may be approximate).     Assessment:   This is a routine wellness examination for Jaziah.  Hearing/Vision screen Hearing Screening - Comments:: Denies hearing difficulty  Vision Screening - Comments:: Wears rx glasses - up to date with routine eye exams with [facility]    Dietary issues and exercise  activities discussed: Current Exercise Habits: The patient does not  participate in regular exercise at present   Goals Addressed   None   Depression Screen    10/30/2022    3:02 PM 04/25/2022    2:08 PM 11/07/2021    2:02 PM 03/08/2021    1:32 PM 10/01/2019   10:38 AM 09/20/2019    2:38 PM 09/06/2019    9:45 AM  PHQ 2/9 Scores  PHQ - 2 Score 0 0 6 0 5 0 0  PHQ- 9 Score   18 8       Fall Risk    10/30/2022    2:56 PM 04/25/2022    2:08 PM 11/07/2021    2:02 PM 10/01/2019   10:38 AM 09/20/2019    2:38 PM  Fall Risk   Falls in the past year? 1 0 0 0 0  Number falls in past yr: 0      Injury with Fall? 1      Risk for fall due to : History of fall(s)      Follow up Falls evaluation completed;Education provided;Falls prevention discussed;Follow up appointment    Falls prevention discussed  Comment Patient plans to call back for follow up appointment        [place]:  Any stairs in or around the home? No  If so, are there any without handrails? No  Home free of loose throw rugs in walkways, pet beds, electrical cords, etc? Yes  Adequate lighting in your home to reduce risk of falls? Yes   ASSISTIVE DEVICES UTILIZED TO PREVENT FALLS:  Life alert? No  Use of a cane, walker or w/c? No  Grab bars in the bathroom? No  Shower chair or bench in shower? No  Elevated toilet seat or a handicapped toilet? No   TIMED UP AND GO:  Was the test performed? No  Telephonic visit   Cognitive Function:    02/11/2018   11:08 AM  MMSE - Mini Mental State Exam  Orientation to time 5  Orientation to Place 5  Registration 3  Attention/ Calculation 5  Recall 3  Language- name 2 objects 2  Language- repeat 1  Language- follow 3 step command 3  Language- read & follow direction 1  Write a sentence 1  Copy design 0  Total score 29        10/30/2022    3:03 PM 10/26/2021    2:05 PM 09/20/2019    2:43 PM  6CIT Screen  What Year? 0 points 0 points 0 points  What month? 0 points 0 points 0 points  What time? 0 points 0  points 0 points  Count back from 20 0 points 0 points 0 points  Months in reverse 0 points 0 points 0 points  Repeat phrase 0 points 0 points 0 points  Total Score 0 points 0 points 0 points    Immunizations Immunization History  Administered Date(s) Administered   COVID-19, mRNA, vaccine(Comirnaty)12 years and older 07/26/2022   Influenza,inj,Quad PF,6+ Mos 08/29/2014, 01/02/2015, 09/12/2016   Influenza-Unspecified 12/20/2015   Tdap 09/24/2008, 10/22/2012   Zoster Recombinat (Shingrix) 11/07/2021, 01/14/2022    TDAP status: Due, Education has been provided regarding the importance of this vaccine. Advised may receive this vaccine at local pharmacy or Health Dept. Aware to provide a copy of the vaccination record if obtained from local pharmacy or Health Dept. Verbalized acceptance and understanding.  Flu Vaccine status: Declined, Education has  been provided regarding the importance of this vaccine but patient still declined. Advised may receive this vaccine at local pharmacy or Health Dept. Aware to provide a copy of the vaccination record if obtained from local pharmacy or Health Dept. Verbalized acceptance and understanding.  Pneumococcal vaccine status: Up to date  Covid-19 vaccine status: Completed vaccines  Qualifies for Shingles Vaccine? Yes   Zostavax completed No   Shingrix Completed?: No.    Education has been provided regarding the importance of this vaccine. Patient has been advised to call insurance company to determine out of pocket expense if they have not yet received this vaccine. Advised may also receive vaccine at local pharmacy or Health Dept. Verbalized acceptance and understanding.  Screening Tests Health Maintenance  Topic Date Due   OPHTHALMOLOGY EXAM  06/11/2015   MAMMOGRAM  09/21/2020   HEMOGLOBIN A1C  10/25/2022   COVID-19 Vaccine (2 - Pfizer risk series) 11/15/2022 (Originally 08/16/2022)   INFLUENZA VACCINE  01/26/2023 (Originally 05/28/2022)   Diabetic  kidney evaluation - eGFR measurement  04/26/2023   Diabetic kidney evaluation - Urine ACR  04/26/2023   FOOT EXAM  04/26/2023   Medicare Annual Wellness (AWV)  10/31/2023   PAP SMEAR-Modifier  03/08/2024   COLONOSCOPY (Pts 45-78yr Insurance coverage will need to be confirmed)  12/21/2024   Hepatitis C Screening  Completed   HIV Screening  Completed   Zoster Vaccines- Shingrix  Completed   HPV VACCINES  Aged Out   DTaP/Tdap/Td  Discontinued    Health Maintenance  Health Maintenance Due  Topic Date Due   OPHTHALMOLOGY EXAM  06/11/2015   MAMMOGRAM  09/21/2020   HEMOGLOBIN A1C  10/25/2022    Colorectal cancer screening: Type of screening: Colonoscopy. Completed 12/22/19. Repeat every 5 years  [facility]amm[facility] status: [facility] provided with contact info and advised to call to schedule appt.    Lung Cancer Screening: (Low Dose CT Chest recommended if Age 58-80years, 30 pack-year currently smoking OR have quit w/in 15years.) does not qualify.   Lung Cancer Screening Referral: n/a  Additional Screening:  Hepatitis C Screening: does qualify; Completed 03/08/21  Vision Screening: Recommended annual ophthalmology exams for early detection of glaucoma and other disorders of the eye. Is the patient up to date with their annual eye exam?  Yes  Who is the provider or what is the name of the office in which the patient attends annual eye exams? [facility] [facility] If pt is not established with a provider, would they like to be referred to a provider to establish care? No .   Dental Screening: Recommended annual dental exams for proper oral hygiene  Community Resource Referral / Chronic Care Management: CRR required this visit?  No   CCM required this visit?  No      Plan:     I have personally reviewed and noted the following in the patient's chart:   Medical and social history Use of alcohol, tobacco or illicit drugs  Current  medications and supplements including opioid prescriptions. Patient is not currently taking opioid prescriptions. Functional ability and status Nutritional status Physical activity Advanced directives List of other physicians Hospitalizations, surgeries, and ER visits in previous 12 months Vitals Screenings to include cognitive, depression, and falls Referrals and appointments  In addition, I have reviewed and discussed with patient certain preventive protocols, quality metrics, and best practice recommendations. A written personalized care plan for preventive services as well as general preventive  health recommendations were provided to patient.     Vanetta Mulders, [place]   06/28/9956   Due to this being a virtual visit, the after visit summary with patients personalized plan was offered to patient via mail or my-chart.Patient would like to access on my-chart  Nurse Notes: Patient plans to call back and schedule an appointment to follow up from a recent fall with right arm pain.

## 2022-10-31 ENCOUNTER — Other Ambulatory Visit (HOSPITAL_COMMUNITY): Payer: Self-pay

## 2022-10-31 NOTE — Telephone Encounter (Signed)
Left message informing patient her novo nordisk re-enrollment (for ozempic) was submitted online 10/31/22. This will take about 4-6 weeks to process and ship to office. Informed patient to keep an eye out for any mail from company or a phone call from office once shipment is rec'd.

## 2022-12-04 ENCOUNTER — Ambulatory Visit (HOSPITAL_COMMUNITY)
Admission: EM | Admit: 2022-12-04 | Discharge: 2022-12-04 | Disposition: A | Discharge status: Home / Self Care | Payer: Medicare Other | Attending: Family Medicine | Admitting: Family Medicine

## 2022-12-04 ENCOUNTER — Encounter: Payer: Self-pay | Admitting: Family

## 2022-12-04 ENCOUNTER — Encounter (HOSPITAL_COMMUNITY): Payer: Self-pay

## 2022-12-04 DIAGNOSIS — J111 Influenza due to unidentified influenza virus with other respiratory manifestations: Secondary | ICD-10-CM | POA: Diagnosis not present

## 2022-12-04 LAB — POC INFLUENZA A AND B ANTIGEN (URGENT CARE ONLY)
INFLUENZA A ANTIGEN, POC: NEGATIVE
INFLUENZA B ANTIGEN, POC: POSITIVE — AB

## 2022-12-04 MED ORDER — HYDROCODONE BIT-HOMATROP MBR 5-1.5 MG/5ML PO SOLN: 5.0000 mL | Freq: Four times a day (QID) | ORAL | 0 refills | Status: DC | PRN

## 2022-12-04 MED ORDER — OSELTAMIVIR PHOSPHATE 75 MG PO CAPS: 75.0000 mg | ORAL_CAPSULE | Freq: Two times a day (BID) | ORAL | 0 refills | Status: AC

## 2022-12-04 NOTE — Discharge Instructions (Signed)
Be aware, your cough medication may cause drowsiness. Please do not drive, operate heavy machinery or make important decisions while on this medication as it may cloud your judgement.

## 2022-12-04 NOTE — ED Triage Notes (Addendum)
[facility] chills body aches, cough, runny nose. Pt has used at home nyquill and musinex and cough medications symptoms started 2 days ago At home covid test neg family sick with the flu.

## 2022-12-05 ENCOUNTER — Encounter: Payer: Self-pay | Admitting: Family

## 2022-12-06 ENCOUNTER — Telehealth: Payer: Self-pay

## 2022-12-06 NOTE — Telephone Encounter (Signed)
Received notification from [employer] regarding approval for [place]. Patient assistance approved from 11/20/22 to 10/28/23.  MEDICATION WILL SHIP TO OFFICE.  Phone: [phone]

## 2022-12-07 NOTE — ED Provider Notes (Signed)
[place]   [id number] 12/04/22 Arrival Time: 1229  ASSESSMENT & PLAN:  1. Influenza    Results for orders placed or performed during the hospital encounter of 12/04/22  POC Influenza A & B Ag (Urgent Care)  Result Value Ref Range   INFLUENZA A ANTIGEN, POC NEGATIVE NEGATIVE   INFLUENZA B ANTIGEN, POC POSITIVE (A) NEGATIVE   Discussed typical duration of influenza. No resp distress. OTC symptom care as needed.  Discharge Medication List as of 12/04/2022  1:47 PM     START taking these medications   Details  HYDROcodone bit-homatropine (HYCODAN) 5-1.5 MG/5ML syrup Take 5 mLs by mouth every 6 (six) hours as needed for cough., Starting Wed 12/04/2022, Normal    oseltamivir (TAMIFLU) 75 MG capsule Take 1 capsule (75 mg total) by mouth 2 (two) times daily for 5 days., Starting Wed 12/04/2022, Until Mon 12/09/2022, Normal       [place] Controlled Substances Registry consulted for this patient. I feel the risk/benefit ratio today is favorable for proceeding with this prescription for a controlled substance. Medication sedation precautions given.    Follow-up Information     Sharion Balloon, FNP.   Specialty: Family Medicine Why: As needed. Contact information: [place] [address] [phone]                 Reviewed expectations re: course of current medical issues. Questions answered. Outlined signs and symptoms indicating need for more acute intervention. Understanding verbalized. After Visit Summary given.   SUBJECTIVE: History from: Patient. Grace Barajas is a 57 y.o. female. Pt [facility] to [facility] with co of chills body aches, cough, runny nose. Pt has used at home nyquill and musinex and cough medications symptoms started 2 days ago At home covid test neg family sick with the flu. Normal PO intake without n/v/d.  OBJECTIVE:  Vitals:   12/04/22 1304  BP: 125/79  Pulse: 92  Resp: 16  Temp: 99.7 F (37.6 C)  TempSrc: Oral   SpO2: 98%    General appearance: alert; no distress Eyes: PERRLA; EOMI; conjunctiva normal HENT: ; AT; with nasal congestion Neck: supple  Lungs: speaks full sentences without difficulty; unlabored; ctab Extremities: no edema Skin: warm and dry Neurologic: normal gait Psychological: alert and cooperative; normal mood and affect  Labs: Results for orders placed or performed during the hospital encounter of 12/04/22  POC Influenza A & B Ag (Urgent Care)  Result Value Ref Range   INFLUENZA A ANTIGEN, POC NEGATIVE NEGATIVE   INFLUENZA B ANTIGEN, POC POSITIVE (A) NEGATIVE   Labs Reviewed  POC INFLUENZA A AND B ANTIGEN (URGENT CARE ONLY) - Abnormal; Notable for the following components:      Result Value   INFLUENZA B ANTIGEN, POC POSITIVE (*)    All other components within normal limits    Imaging: No results found.  Allergies  Allergen Reactions   Morphine And Related     "Felt like an addict needing a fix."    Past Medical History:  Diagnosis Date   Acute medial meniscus tear of left knee 09/2019   Allergy    SEASONAL   Anxiety    COVID 04-2020 or 05-2020   cough tired flu like symptoms x 7 days symptoms resolved   Depression    GERD (gastroesophageal reflux disease)    Heart murmur    mild no cardiologist   High cholesterol    Pneumonia 2018   Pre-diabetes    Sinus  disorder    Sleep apnea    uses CPAP   Sleep apnea with use of continuous positive airway pressure (CPAP)    Snores    Wears glasses    Social History   Socioeconomic History   Marital status: Single    Spouse name: Not on file   Number of children: 3   Years of education: Not on file   Highest education level: Bachelor's degree (e.g., BA, AB, BS)  Occupational History   Occupation: disabled   Occupation: Ship broker  Tobacco Use   Smoking status: Never   Smokeless tobacco: Never  Vaping Use   Vaping Use: Never used  Substance and Sexual Activity   Alcohol use: Not Currently     Comment: rare   Drug use: No   Sexual activity: Not on file  Other Topics Concern   Not on file  Social History Narrative   Not on file   Social Determinants of Health   Financial Resource Strain: Low Risk  (10/30/2022)   Overall Financial Resource Strain (CARDIA)    Difficulty of Paying Living Expenses: Not hard at all  Food Insecurity: No Food Insecurity (10/30/2022)   Hunger Vital Sign    Worried About Running Out of Food in the Last Year: Never true    Ran Out of Food in the Last Year: Never true  Transportation Needs: No Transportation Needs (10/30/2022)   PRAPARE - Hydrologist (Medical): No    Lack of Transportation (Non-Medical): No  Physical Activity: Unknown (10/30/2022)   Exercise Vital Sign    Days of Exercise per Week: 0 days    Minutes of Exercise per Session: Not on file  Stress: Stress Concern Present (10/30/2022)   [place]    Feeling of Stress : To some extent  Social Connections: Moderately Isolated (10/30/2022)   Social Connection and Isolation Panel [NHANES]    Frequency of Communication with Friends and Family: More than three times a week    Frequency of Social Gatherings with Friends and Family: Three times a week    Attends Religious Services: More than 4 times per year    Active Member of Clubs or Organizations: No    Attends Archivist Meetings: Never    Marital Status: Never married  Intimate Partner Violence: Not At Risk (10/30/2022)   Humiliation, Afraid, Rape, and Kick questionnaire    Fear of Current or Ex-Partner: No    Emotionally Abused: No    Physically Abused: No    Sexually Abused: No   Family History  Problem Relation Age of Onset   Asthma Son    Asthma Brother    Obesity Father    Diabetes Father    Heart disease Father    Hyperlipidemia Father    Diabetes Maternal Grandmother    Diabetes Maternal Aunt    Diabetes Maternal Uncle     Colon polyps Maternal Uncle    Thyroid disease Neg Hx    Breast cancer Neg Hx    Colon cancer Neg Hx    Esophageal cancer Neg Hx    Rectal cancer Neg Hx    Stomach cancer Neg Hx    Past Surgical History:  Procedure Laterality Date   BILATERAL SALPINGECTOMY Bilateral 01/21/2013   Procedure: BILATERAL SALPINGECTOMY;  Surgeon: Marvene Staff, MD;  Location: [facility];  Service: Gynecology;  Laterality: Bilateral;   CARPAL TUNNEL RELEASE Left 07/03/2007   CARPAL  TUNNEL RELEASE Right 07/31/2007   CERVICAL FUSION  1993   C1/C2   COLONOSCOPY  06/26/2019   DORSAL COMPARTMENT RELEASE Bilateral 01/13/2014   Procedure: BILATERAL 1ST DORSAL COMPARTMENT RELEASES;  Surgeon: Cammie Sickle., MD;  Location: [facility];  Service: Orthopedics;  Laterality: Bilateral;  bilateral   JOINT REPLACEMENT     KNEE ARTHROSCOPY WITH SUBCHONDROPLASTY Left 10/05/2020   Procedure: Left knee arthroscopic partial medial meniscectomy with medial tibial subchondroplasty;  Surgeon: Nicholes Stairs, MD;  Location: [facility];  Service: Orthopedics;  Laterality: Left;  75 mins   left knee meniscus surgery  09/2019   [facility]   ROBOTIC ASSISTED TOTAL HYSTERECTOMY N/A 01/21/2013   Procedure: ROBOTIC ASSISTED TOTAL HYSTERECTOMY;  Surgeon: Marvene Staff, MD;  Location: [facility];  Service: Gynecology;  Laterality: N/A;   TUBAL LIGATION  yrs ago     Vanessa Kick, MD 12/07/22 1100

## 2022-12-10 DIAGNOSIS — G4733 Obstructive sleep apnea (adult) (pediatric): Secondary | ICD-10-CM | POA: Diagnosis not present

## 2022-12-19 ENCOUNTER — Other Ambulatory Visit: Payer: Self-pay | Admitting: Family

## 2023-01-14 ENCOUNTER — Other Ambulatory Visit: Payer: Self-pay | Admitting: Family

## 2023-01-14 DIAGNOSIS — Z1231 Encounter for screening mammogram for malignant neoplasm of breast: Secondary | ICD-10-CM

## 2023-01-15 ENCOUNTER — Ambulatory Visit
Admission: RE | Admit: 2023-01-15 | Discharge: 2023-01-15 | Disposition: A | Discharge status: Home / Self Care | Payer: Medicare Other | Source: Ambulatory Visit | Attending: Family | Admitting: Family

## 2023-01-15 DIAGNOSIS — Z1231 Encounter for screening mammogram for malignant neoplasm of breast: Secondary | ICD-10-CM

## 2023-01-29 ENCOUNTER — Other Ambulatory Visit: Payer: Self-pay | Admitting: Family

## 2023-02-04 ENCOUNTER — Encounter (HOSPITAL_BASED_OUTPATIENT_CLINIC_OR_DEPARTMENT_OTHER): Payer: Medicare Other

## 2023-02-04 ENCOUNTER — Ambulatory Visit (HOSPITAL_BASED_OUTPATIENT_CLINIC_OR_DEPARTMENT_OTHER): Payer: Medicare Other | Admitting: Pulmonary Disease

## 2023-02-13 ENCOUNTER — Other Ambulatory Visit: Payer: Self-pay | Admitting: Family

## 2023-02-13 NOTE — Telephone Encounter (Signed)
[facility] pt NTBS 30-d given 12/19/22

## 2023-02-14 ENCOUNTER — Encounter: Payer: Self-pay | Admitting: Family

## 2023-02-14 NOTE — Telephone Encounter (Signed)
LMOVM to call and schedule appt. Letter sent.

## 2023-03-13 ENCOUNTER — Other Ambulatory Visit: Payer: Self-pay | Admitting: Pharmacist

## 2023-03-13 ENCOUNTER — Encounter: Payer: Self-pay | Admitting: Pharmacist

## 2023-03-13 DIAGNOSIS — E119 Type 2 diabetes mellitus without complications: Secondary | ICD-10-CM

## 2023-03-13 NOTE — Progress Notes (Signed)
    03/13/2023 Name: Grace Barajas MRN: [id number] DOB: 1966-04-21    Patient is stable on current Ozempic 1mg  weekly therapy.  Denies personal and family history of Medullary thyroid cancer (MTC).  #4 boxes of Ozempic 1mg  in lab fridge for pick up.  Patient states her blood sugar and blood pressure remain within normal limits.  GFR 93.  Denies side effects.   Kieth Brightly, PharmD, BCACP Clinical Pharmacist, [facility]

## 2023-03-21 ENCOUNTER — Ambulatory Visit (INDEPENDENT_AMBULATORY_CARE_PROVIDER_SITE_OTHER): Payer: Medicare Other | Admitting: Family

## 2023-03-21 ENCOUNTER — Encounter: Payer: Self-pay | Admitting: Family

## 2023-03-21 VITALS — BP 115/73 | HR 69 | Temp 97.9°F | Ht 66.0 in | Wt 183.0 lb

## 2023-03-21 DIAGNOSIS — H547 Unspecified visual loss: Secondary | ICD-10-CM | POA: Diagnosis not present

## 2023-03-21 DIAGNOSIS — G8929 Other chronic pain: Secondary | ICD-10-CM

## 2023-03-21 DIAGNOSIS — M25562 Pain in left knee: Secondary | ICD-10-CM | POA: Diagnosis not present

## 2023-03-21 DIAGNOSIS — G43009 Migraine without aura, not intractable, without status migrainosus: Secondary | ICD-10-CM

## 2023-03-21 DIAGNOSIS — H9191 Unspecified hearing loss, right ear: Secondary | ICD-10-CM | POA: Diagnosis not present

## 2023-03-21 NOTE — Progress Notes (Signed)
Subjective:    Patient ID: Grace Barajas Chappelle, female    DOB: 07/26/1966, 57 y.o.   MRN: [id number]  Chief Complaint  Patient presents with   Hearing Problem   Migraine   Ear Pain    BEHIND RIGHT EAR    Knee Pain    LEFT HAD HAD SURGERY BEFORE.   LUMPS UNDER RIGHT ARM    PT presents to the office today with multiple complaints.  Reports having left knee pain. She has had meniscus repair in 2021 and 2022. Has had steroid injections with mild relief.   Reports a new onset of migraine on right occipital. Does reports increase hearing loss in right ear.  Has seen Audiologists that told her she had decrease hearing in right ear. Saw ENT and was cleared. Also reports decrease vision over the last year. Did see ophthalmologists and was prescribed glasses.  Migraine  This is a new problem. The current episode started 1 to 4 weeks ago. Episode frequency: twice a week. The pain is located in the Right unilateral region. The pain quality is not similar to prior headaches. The quality of the pain is described as aching. The pain is at a severity of 7/10. Associated symptoms include blurred vision and hearing loss. Pertinent negatives include no nausea, phonophobia, photophobia or vomiting.  Knee Pain  The incident occurred more than 1 week ago. There was no injury mechanism. The pain is present in the left ankle. The pain is at a severity of 1/10 (when walking 10 mins then can be 8). The pain is moderate. The pain has been Intermittent since onset. She reports no foreign bodies present.      Review of Systems  HENT:  Positive for hearing loss.   Eyes:  Positive for blurred vision. Negative for photophobia.  Gastrointestinal:  Negative for nausea and vomiting.  All other systems reviewed and are negative.      Objective:   Physical Exam Vitals reviewed.  Constitutional:      General: She is not in acute distress.    Appearance: She is well-developed.  HENT:     Head: Normocephalic and  atraumatic.     Right Ear: Tympanic membrane normal.     Left Ear: Tympanic membrane normal.  Eyes:     Pupils: Pupils are equal, round, and reactive to light.  Neck:     Thyroid: No thyromegaly.  Cardiovascular:     Rate and Rhythm: Normal rate and regular rhythm.     Heart sounds: Normal heart sounds. No murmur heard. Pulmonary:     Effort: Pulmonary effort is normal. No respiratory distress.     Breath sounds: Normal breath sounds. No wheezing.  Abdominal:     General: Bowel sounds are normal. There is no distension.     Palpations: Abdomen is soft.     Tenderness: There is no abdominal tenderness.  Musculoskeletal:        General: No tenderness. Normal range of motion.     Cervical back: Normal range of motion and neck supple.  Skin:    General: Skin is warm and dry.  Neurological:     Mental Status: She is alert and oriented to person, place, and time.     Cranial Nerves: No cranial nerve deficit.     Deep Tendon Reflexes: Reflexes are normal and symmetric.  Psychiatric:        Behavior: Behavior normal.        Thought Content: Thought content normal.  Judgment: Judgment normal.       BP 115/73   Pulse 69   Temp 97.9 F (36.6 C) (Temporal)   Ht 5\' 6"  (1.676 m)   Wt 183 lb (83 kg)   LMP 01/11/2013   SpO2 98%   BMI 29.54 kg/m      Assessment & Plan:  Grace Barajas Burd comes in today with chief complaint of Hearing Problem, Migraine, Ear Pain (BEHIND RIGHT EAR ), Knee Pain (LEFT HAD HAD SURGERY BEFORE.), and LUMPS UNDER RIGHT ARM    Diagnosis and orders addressed:  1. Migraine without aura and without status migrainosus, not intractable - CT HEAD WO CONTRAST ( ); Future  2. Decreased vision - CT HEAD WO CONTRAST ( ); Future  3. Hearing loss of right ear, unspecified hearing loss type - CT HEAD WO CONTRAST ( ); Future  4. Chronic pain of left knee - Ambulatory referral to Orthopedic Surgery  Given new onset of migraine with vision and  hearing problems will order CT Referral to Ortho pending  Health Maintenance reviewed Diet and exercise encouraged  Follow up plan:  1 month    Jannifer Rodney, FNP

## 2023-03-21 NOTE — Patient Instructions (Signed)

## 2023-03-26 ENCOUNTER — Telehealth: Payer: Self-pay | Admitting: Family

## 2023-03-26 ENCOUNTER — Ambulatory Visit: Payer: Medicare Other | Admitting: Physician Assistant

## 2023-03-26 NOTE — Telephone Encounter (Signed)
Patient needs her CT appt rescheduled because she already has an appt scheduled for tomorrow at 8:45 somewhere else. Wants someone to call her before making the appt because she has a busy schedule.

## 2023-03-27 ENCOUNTER — Ambulatory Visit: Payer: Medicare Other | Admitting: Orthopaedic Surgery

## 2023-03-27 ENCOUNTER — Ambulatory Visit (HOSPITAL_COMMUNITY): Payer: Medicare Other

## 2023-04-11 ENCOUNTER — Telehealth: Payer: Self-pay | Admitting: Pulmonary Disease

## 2023-04-11 NOTE — Telephone Encounter (Signed)
Pt wants to see if she can get a earlier appt, I added pt to the wait list but she wanted a message put back as well.

## 2023-04-11 NOTE — Telephone Encounter (Signed)
Spoke with patient. Advised there were no sooner apts available. I advised she has been put on the cancellation list and will call if something sooner becomes available. She verbalized understanding. NFN

## 2023-04-16 ENCOUNTER — Telehealth: Payer: Self-pay | Admitting: Family

## 2023-04-16 DIAGNOSIS — H9191 Unspecified hearing loss, right ear: Secondary | ICD-10-CM

## 2023-04-16 DIAGNOSIS — G43009 Migraine without aura, not intractable, without status migrainosus: Secondary | ICD-10-CM

## 2023-04-17 NOTE — Telephone Encounter (Signed)
CT reordered.   Jannifer Rodney, FNP

## 2023-04-17 NOTE — Telephone Encounter (Signed)
She has a CT head ordered for 04/22/2023  7:00 AM at [facility]

## 2023-04-17 NOTE — Telephone Encounter (Signed)
Patient aware and verbalizes understanding. 

## 2023-04-17 NOTE — Telephone Encounter (Signed)
They have it scheduled but they are going to cancel it if they do not have a new order because the other one was closed out.

## 2023-04-17 NOTE — Addendum Note (Signed)
Addended by: Jannifer Rodney A on: 04/17/2023 02:06 PM   Modules accepted: Orders

## 2023-04-22 ENCOUNTER — Ambulatory Visit (HOSPITAL_BASED_OUTPATIENT_CLINIC_OR_DEPARTMENT_OTHER)
Admission: RE | Admit: 2023-04-22 | Discharge: 2023-04-22 | Disposition: A | Discharge status: Home / Self Care | Payer: Medicare Other | Source: Ambulatory Visit | Attending: Family | Admitting: Family

## 2023-04-22 DIAGNOSIS — G43009 Migraine without aura, not intractable, without status migrainosus: Secondary | ICD-10-CM | POA: Diagnosis not present

## 2023-04-22 DIAGNOSIS — H9191 Unspecified hearing loss, right ear: Secondary | ICD-10-CM | POA: Diagnosis not present

## 2023-04-22 DIAGNOSIS — H547 Unspecified visual loss: Secondary | ICD-10-CM | POA: Insufficient documentation

## 2023-04-22 DIAGNOSIS — R519 Headache, unspecified: Secondary | ICD-10-CM | POA: Diagnosis not present

## 2023-04-28 ENCOUNTER — Other Ambulatory Visit: Payer: Self-pay | Admitting: Family

## 2023-04-28 MED ORDER — AMOXICILLIN-POT CLAVULANATE 875-125 MG PO TABS: 1.0000 | ORAL_TABLET | Freq: Two times a day (BID) | ORAL | 0 refills | Status: DC

## 2023-06-03 ENCOUNTER — Telehealth: Payer: Self-pay

## 2023-06-03 NOTE — Telephone Encounter (Signed)
Patient advised we have received a shipment of patient assistance medication, Ozempic, for her and it will be placed in the refrigerator for pick up.

## 2023-06-03 NOTE — Telephone Encounter (Signed)
Pt needs to be seen if still having issues. Recommend taking zyrtec and flonase daily.   Jannifer Rodney, FNP

## 2023-06-03 NOTE — Telephone Encounter (Signed)
Patient has finished antibiotic, Augmentin, that was prescribed on 04/28/23 and not any better.  Still having congestion.  Wants to  know if you will send in something else to [facility] on [address] in [place].

## 2023-06-04 NOTE — Telephone Encounter (Signed)
Attempted to contact - NA 

## 2023-06-12 ENCOUNTER — Telehealth: Payer: Self-pay | Admitting: Pulmonary Disease

## 2023-06-12 DIAGNOSIS — R911 Solitary pulmonary nodule: Secondary | ICD-10-CM

## 2023-06-12 NOTE — Telephone Encounter (Signed)
-----   Message from Romoland H sent at 06/12/2023  3:36 PM EDT ----- Regarding: PFT staff crisis Hey due to staffing crisis patient is unable to have PFT prior to her appointment with you. This was also a follow up after HST which results are available in the patients chart.  Willing to still see her without PFTs?

## 2023-06-12 NOTE — Telephone Encounter (Signed)
Patient called back to see why something else had not been called in. Explained that she needed to make an appt. She did not want to and wanted PCP nurse to call her back.

## 2023-06-12 NOTE — Telephone Encounter (Signed)
She needs a CT chest wo con done  prior to follow up please OK if pFTs are delayed I have ordered

## 2023-06-13 NOTE — Telephone Encounter (Signed)
Per patinet wants referral for Moderate to marked severity right maxillary sinus disease.  Off ct

## 2023-06-17 ENCOUNTER — Ambulatory Visit (HOSPITAL_BASED_OUTPATIENT_CLINIC_OR_DEPARTMENT_OTHER): Payer: Medicare Other | Admitting: Pulmonary Disease

## 2023-06-17 ENCOUNTER — Encounter (HOSPITAL_BASED_OUTPATIENT_CLINIC_OR_DEPARTMENT_OTHER): Payer: Medicare Other

## 2023-06-22 ENCOUNTER — Ambulatory Visit (HOSPITAL_BASED_OUTPATIENT_CLINIC_OR_DEPARTMENT_OTHER)
Admission: RE | Admit: 2023-06-22 | Discharge: 2023-06-22 | Disposition: A | Discharge status: Home / Self Care | Payer: Medicare Other | Source: Ambulatory Visit | Attending: Pulmonary Disease | Admitting: Pulmonary Disease

## 2023-06-22 DIAGNOSIS — R911 Solitary pulmonary nodule: Secondary | ICD-10-CM | POA: Diagnosis not present

## 2023-06-22 DIAGNOSIS — R918 Other nonspecific abnormal finding of lung field: Secondary | ICD-10-CM | POA: Diagnosis not present

## 2023-06-22 DIAGNOSIS — I700 Atherosclerosis of aorta: Secondary | ICD-10-CM | POA: Diagnosis not present

## 2023-06-22 DIAGNOSIS — E042 Nontoxic multinodular goiter: Secondary | ICD-10-CM | POA: Diagnosis not present

## 2023-06-24 NOTE — Telephone Encounter (Signed)
Ct 8/25 and appt 9/9

## 2023-07-07 ENCOUNTER — Ambulatory Visit (HOSPITAL_BASED_OUTPATIENT_CLINIC_OR_DEPARTMENT_OTHER): Payer: Medicare Other | Admitting: Pulmonary Disease

## 2023-07-07 ENCOUNTER — Encounter (HOSPITAL_BASED_OUTPATIENT_CLINIC_OR_DEPARTMENT_OTHER): Payer: Self-pay | Admitting: Pulmonary Disease

## 2023-07-07 VITALS — BP 124/80 | HR 70 | Resp 16 | Ht 66.0 in | Wt 184.0 lb

## 2023-07-07 DIAGNOSIS — G4733 Obstructive sleep apnea (adult) (pediatric): Secondary | ICD-10-CM | POA: Diagnosis not present

## 2023-07-07 DIAGNOSIS — R911 Solitary pulmonary nodule: Secondary | ICD-10-CM

## 2023-07-07 NOTE — Patient Instructions (Signed)
X CT chest without contrast in August 2025  x schedule PFTs  x home sleep test

## 2023-07-07 NOTE — Assessment & Plan Note (Signed)
Possibly benign hematoma or carcinoid.  Stable over the past year. She is hesitant and would like to avoid surgery She has already seen cardiothoracic surgery. Will reschedule PFTs to assess lung function and schedule follow-up CT chest without contrast in 1 year

## 2023-07-07 NOTE — Assessment & Plan Note (Signed)
Due to her significant weight loss, it is possible that sleep apnea significantly improved. Will repeat home sleep test

## 2023-07-07 NOTE — Progress Notes (Signed)
   Subjective:    Patient ID: Grace Barajas, female    DOB: [birth year], 57 y.o.   MRN: [id number]  HPI 57 yo never smoker for follow-up of moderate OSA and right lower lobe nodule    10mm RLL nodule Noted incidentally on CT scan when she had community-acquired pneumonia in 2018 This has gradually grown in size dotatate PET scan was turned down by insurance 2021 in spite of peer-to-peer review, TCTS recommended resection   PMH -multinodular goiter Schizoaffective disorder  Annual follow-up visit. Her breathing is stable. She has lost significant weight with Ozempic, in 2017 she weighed 2 1 2  pounds and now she is down to 184, about 28 pound weight loss. She still uses her CPAP but admits that she misses it a few nights. She denies snoring or daytime somnolence.  She has an AutoSet 10 a CPAP which she obtained in 2015 I do not have a more recent download.  She would like to avoid surgery for her lung nodule if possible.  We reviewed repeat CT chest  Significant tests/ events reviewed  CT chest without con 05/2023 stable nodule 19 x 14 mm  CT chest without con >> right lower lobe nodule 1.9 x 1.3 cm, enlarged compared to 2018 CT chest wo con 03/2021 >> Minimal progression of 1.7 x 1.3 cm (previously 1.6 x 1.2 cm) retro hilar RLL nodule   PET 09/2019 1.7 x 1.2 cm , negative     US thyroid 09/2018 >> multinodular goiter. No definitive worrisomenew or enlarging thyroid nodules.  Previously biopsied nodule within the right lobe of the thyroid (labeled 2), is grossly unchanged compared to the 11/2016 examination Left mid nodule >> 1 yr FU advised     10/2016-CT angiogram showed  a right lower lobe 10 mm nodule. There were 2 thyroid nodules 10 mm calcified left lobe and 8 mm noncalcified left lobe and possible adrenal nodule was noted measuring 1.1 cm in the left.   NPSG 06/2014:  AHI 15/hr, desat 89% 06/2015 AHI 16   Spirometry 08/2016 was a poor effort but showed normal lung function  without airway obstruction     Review of Systems neg for any significant sore throat, dysphagia, itching, sneezing, nasal congestion or excess/ purulent secretions, fever, chills, sweats, unintended wt loss, pleuritic or exertional cp, hempoptysis, orthopnea pnd or change in chronic leg swelling. Also denies presyncope, palpitations, heartburn, abdominal pain, nausea, vomiting, diarrhea or change in bowel or urinary habits, dysuria,hematuria, rash, arthralgias, visual complaints, headache, numbness weakness or ataxia.     Objective:   Physical Exam  Gen. Pleasant, well-nourished, in no distress ENT - no thrush, no pallor/icterus,no post nasal drip Neck: No JVD, no thyromegaly, no carotid bruits Lungs: no use of accessory muscles, no dullness to percussion, clear without rales or rhonchi  Cardiovascular: Rhythm regular, heart sounds  normal, no murmurs or gallops, no peripheral edema Musculoskeletal: No deformities, no cyanosis or clubbing        Assessment & Plan:

## 2023-07-16 DIAGNOSIS — G4730 Sleep apnea, unspecified: Secondary | ICD-10-CM | POA: Diagnosis not present

## 2023-07-16 DIAGNOSIS — G4733 Obstructive sleep apnea (adult) (pediatric): Secondary | ICD-10-CM

## 2023-08-08 ENCOUNTER — Telehealth: Payer: Self-pay | Admitting: Pulmonary Disease

## 2023-08-08 DIAGNOSIS — G4733 Obstructive sleep apnea (adult) (pediatric): Secondary | ICD-10-CM

## 2023-08-08 NOTE — Telephone Encounter (Signed)
HST showed mild  OSA with AHI 12/ hr & low sat of 87% This is slightly improved compared to 2016 when we last tested her - due to wt loss  I would suggest she get back on her CPAP, esp if she still has some symptoms

## 2023-11-03 ENCOUNTER — Ambulatory Visit: Payer: Medicare Other

## 2023-11-03 VITALS — Ht 66.0 in | Wt 184.0 lb

## 2023-11-03 DIAGNOSIS — Z0000 Encounter for general adult medical examination without abnormal findings: Secondary | ICD-10-CM

## 2023-11-03 NOTE — Patient Instructions (Signed)
 Grace Barajas , Thank you for taking time to come for your Medicare Wellness Visit. I appreciate your ongoing commitment to your health goals. Please review the following plan we discussed and let me know if I can assist you in the future.   Referrals/Orders/Follow-Ups/Clinician Recommendations: Aim for 30 minutes of exercise or brisk walking, 6-8 glasses of water , and 5 servings of fruits and vegetables each day.  This is a list of the screening recommended for you and due dates:  Health Maintenance  Topic Date Due   Eye exam for diabetics  06/11/2015   Hemoglobin A1C  10/25/2022   Yearly kidney function blood test for diabetes  04/26/2023   Yearly kidney health urinalysis for diabetes  04/26/2023   Complete foot exam   04/26/2023   COVID-19 Vaccine (2 - 2024-25 season) 06/29/2023   Flu Shot  01/26/2024*   Mammogram  01/15/2024   Medicare Annual Wellness Visit  11/02/2024   Colon Cancer Screening  12/21/2024   Hepatitis C Screening  Completed   HIV Screening  Completed   Zoster (Shingles) Vaccine  Completed   HPV Vaccine  Aged Out   DTaP/Tdap/Td vaccine  Discontinued  *Topic was postponed. The date shown is not the original due date.    Advanced directives: (ACP Link)Information on Advanced Care Planning can be found at [place]  Secretary of [facility] Advance Health Care Directives Advance Health Care Directives ([email or web address]   Next Medicare Annual Wellness Visit scheduled for next year: Yes

## 2023-11-03 NOTE — Progress Notes (Signed)
 Subjective:   Grace Barajas is a 58 y.o. female who presents for Medicare Annual (Subsequent) preventive examination.  Visit Complete: Virtual I connected with  Grace Barajas on 11/03/23 by a audio enabled telemedicine application and verified that I am speaking with the correct person using two identifiers.  Patient Location: Home  Provider Location: Home Office  This patient declined Interactive audio and video telecommunications. Therefore the visit was completed with audio only.  I discussed the limitations of evaluation and management by telemedicine. The patient expressed understanding and agreed to proceed.  Vital Signs: Because this visit was a virtual/telehealth visit, some criteria may be missing or patient reported. Any vitals not documented were not able to be obtained and vitals that have been documented are patient reported.  Cardiac Risk Factors include: diabetes mellitus;dyslipidemia;hypertension     Objective:    Today's Vitals   11/03/23 1609  Weight: 184 lb (83.5 kg)  Height: 5' 6 (1.676 m)   Body mass index is 29.7 kg/m.     11/03/2023    4:30 PM 10/30/2022    3:03 PM 10/26/2021    2:04 PM 10/05/2020    9:47 AM 10/01/2019   10:38 AM 09/20/2019    2:38 PM 02/11/2018   11:05 AM  Advanced Directives  Does Patient Have a Medical Advance Directive? No No Yes Yes Yes Yes No  Type of Advance Directive   Living will Healthcare Power of [place];Living will  Healthcare Power of Attorney   Does patient want to make changes to medical advance directive?    No - Patient declined No - Patient declined    Copy of Healthcare Power of Attorney in Chart?    No - copy requested  No - copy requested   Would patient like information on creating a medical advance directive? Yes (MAU/Ambulatory/Procedural Areas - Information given) No - Patient declined No - Patient declined    No - Patient declined    Current Medications (verified) Outpatient Encounter Medications  as of 11/03/2023  Medication Sig   buPROPion (WELLBUTRIN XL) 300 MG 24 hr tablet Take 300 mg by mouth daily.   cetirizine (ZYRTEC) 10 MG tablet Take 10 mg by mouth daily.   FLUoxetine  (PROZAC ) 40 MG capsule Take 80 mg by mouth daily.   Semaglutide , 1 MG/DOSE, 4 MG/3ML SOPN Inject 1 mg as directed once a week.   VYVANSE 20 MG capsule Take 20 mg by mouth every morning.   No facility-administered encounter medications on file as of 11/03/2023.    Allergies (verified) Morphine and codeine   History: Past Medical History:  Diagnosis Date   Acute medial meniscus tear of left knee 09/2019   Allergy    SEASONAL   Anxiety    COVID 04-2020 or 05-2020   cough tired flu like symptoms x 7 days symptoms resolved   Depression    GERD (gastroesophageal reflux disease)    Heart murmur    mild no cardiologist   High cholesterol    Pneumonia 2018   Pre-diabetes    Sinus disorder    Sleep apnea    uses CPAP   Sleep apnea with use of continuous positive airway pressure (CPAP)    Snores    Wears glasses    Past Surgical History:  Procedure Laterality Date   BILATERAL SALPINGECTOMY Bilateral 01/21/2013   Procedure: BILATERAL SALPINGECTOMY;  Surgeon: Dickie DELENA Carder, MD;  Location: [facility];  Service: Gynecology;  Laterality: Bilateral;   CARPAL TUNNEL RELEASE  Left 07/03/2007   CARPAL TUNNEL RELEASE Right 07/31/2007   CERVICAL FUSION  1993   C1/C2   COLONOSCOPY  06/26/2019   DORSAL COMPARTMENT RELEASE Bilateral 01/13/2014   Procedure: BILATERAL 1ST DORSAL COMPARTMENT RELEASES;  Surgeon: Lamar LULLA Leonor Mickey., MD;  Location: [facility];  Service: Orthopedics;  Laterality: Bilateral;  bilateral   JOINT REPLACEMENT     KNEE ARTHROSCOPY WITH SUBCHONDROPLASTY Left 10/05/2020   Procedure: Left knee arthroscopic partial medial meniscectomy with medial tibial subchondroplasty;  Surgeon: Sharl Selinda Dover, MD;  Location: [facility];  Service: Orthopedics;  Laterality: Left;   75 mins   left knee meniscus surgery  09/2019   [facility] [place]   ROBOTIC ASSISTED TOTAL HYSTERECTOMY N/A 01/21/2013   Procedure: ROBOTIC ASSISTED TOTAL HYSTERECTOMY;  Surgeon: Dickie DELENA Carder, MD;  Location: [facility];  Service: Gynecology;  Laterality: N/A;   TUBAL LIGATION  yrs ago   Family History  Problem Relation Age of Onset   Asthma Son    Asthma Brother    Obesity Father    Diabetes Father    Heart disease Father    Hyperlipidemia Father    Diabetes Maternal Grandmother    Diabetes Maternal Aunt    Diabetes Maternal Uncle    Colon polyps Maternal Uncle    Thyroid  disease Neg Hx    Breast cancer Neg Hx    Colon cancer Neg Hx    Esophageal cancer Neg Hx    Rectal cancer Neg Hx    Stomach cancer Neg Hx    Social History   Socioeconomic History   Marital status: Single    Spouse name: Not on file   Number of children: 3   Years of education: Not on file   Highest education level: Bachelor's degree (e.g., BA, AB, BS)  Occupational History   Occupation: disabled   Occupation: consulting civil engineer  Tobacco Use   Smoking status: Never   Smokeless tobacco: Never  Vaping Use   Vaping status: Never Used  Substance and Sexual Activity   Alcohol use: Not Currently    Comment: rare   Drug use: No   Sexual activity: Not on file  Other Topics Concern   Not on file  Social History Narrative   Not on file   Social Drivers of Health   Financial Resource Strain: Low Risk  (11/03/2023)   Overall Financial Resource Strain (CARDIA)    Difficulty of Paying Living Expenses: Not hard at all  Food Insecurity: No Food Insecurity (11/03/2023)   Hunger Vital Sign    Worried About Running Out of Food in the Last Year: Never true    Ran Out of Food in the Last Year: Never true  Transportation Needs: No Transportation Needs (11/03/2023)   PRAPARE - Administrator, Civil Service (Medical): No    Lack of Transportation (Non-Medical): No  Physical Activity: Inactive  (11/03/2023)   Exercise Vital Sign    Days of Exercise per Week: 0 days    Minutes of Exercise per Session: 0 min  Stress: No Stress Concern Present (11/03/2023)   [employer] of Occupational Health - Occupational Stress Questionnaire    Feeling of Stress : Only a little  Social Connections: Moderately Isolated (11/03/2023)   Social Connection and Isolation Panel [NHANES]    Frequency of Communication with Friends and Family: More than three times a week    Frequency of Social Gatherings with Friends and Family: Three times a week  Attends Religious Services: 1 to 4 times per year    Active Member of Clubs or Organizations: No    Attends Banker Meetings: Never    Marital Status: Never married    Tobacco Counseling Counseling given: Not Answered   Clinical Intake:  Pre-visit preparation completed: Yes  Pain : No/denies pain     Diabetes: No  How often do you need to have someone help you when you read instructions, pamphlets, or other written materials from your doctor or pharmacy?: 1 - Never  Interpreter Needed?: No  Information entered by :: Charmaine Bloodgood LPN   Activities of Daily Living    11/03/2023    4:29 PM  In your present state of health, do you have any difficulty performing the following activities:  Hearing? 0  Vision? 0  Difficulty concentrating or making decisions? 0  Walking or climbing stairs? 0  Dressing or bathing? 0  Doing errands, shopping? 0  Preparing Food and eating ? N  Using the Toilet? N  In the past six months, have you accidently leaked urine? N  Do you have problems with loss of bowel control? N  Managing your Medications? N  Managing your Finances? N  Housekeeping or managing your Housekeeping? N    Patient Care Team: Lavell Bari LABOR, FNP as PCP - General (Nurse Practitioner) Jude Harden GAILS, MD as Consulting Physician (Pulmonary Disease) Clinger, Norleen BIRCH, MD (Otolaryngology) Landy Darice Norris, MD as  Referring Physician (Psychiatry) Brinda Elsie BRAVO, OD (Optometry) [facility] [place], Elsie Gee, DO (Psychiatry)  Indicate any recent Medical Services you may have received from other than [facility] providers in the past year (date may be approximate).     Assessment:   This is a routine wellness examination for Aleena.  Hearing/Vision screen Hearing Screening - Comments:: Some hearing problems with right ear  Vision Screening - Comments:: Wears rx glasses - up to date with routine eye exams with [facility] ([employer])     Goals Addressed             This Visit's Progress    COMPLETED: awv       09/20/2019 AWV Goal: Exercise for General Health  Patient will verbalize understanding of the benefits of increased physical activity: Exercising regularly is important. It will improve your overall fitness, flexibility, and endurance. Regular exercise also will improve your overall health. It can help you control your weight, reduce stress, and improve your bone density. Over the next year, patient will increase physical activity as tolerated with a goal of at least 150 minutes of moderate physical activity per week.  You can tell that you are exercising at a moderate intensity if your heart starts beating faster and you start breathing faster but can still hold a conversation. Moderate-intensity exercise ideas include: Walking 1 mile (1.6 km) in about 15 minutes Biking Hiking Golfing Dancing Water  aerobics Patient will verbalize understanding of everyday activities that increase physical activity by providing examples like the following: Yard work, such as: Insurance Underwriter Gardening Washing windows or floors Patient will be able to explain general safety guidelines for exercising:  Before you start a new exercise program, talk with your health care provider. Do not exercise  so much that you hurt yourself, feel dizzy, or get very short of breath. Wear comfortable clothes and wear shoes with good support. Drink plenty of water  while you  exercise to prevent dehydration or heat stroke. Work out until your breathing and your heartbeat get faster.      DIET - EAT MORE FRUITS AND VEGETABLES        Depression Screen    11/03/2023    4:28 PM 10/30/2022    3:02 PM 04/25/2022    2:08 PM 11/07/2021    2:02 PM 03/08/2021    1:32 PM 10/01/2019   10:38 AM 09/20/2019    2:38 PM  PHQ 2/9 Scores  PHQ - 2 Score 0 0 0 6 0 5 0  PHQ- 9 Score    18 8      Fall Risk    11/03/2023    4:29 PM 10/30/2022    2:56 PM 04/25/2022    2:08 PM 11/07/2021    2:02 PM 10/01/2019   10:38 AM  Fall Risk   Falls in the past year? 0 1 0 0 0  Number falls in past yr: 0 0     Injury with Fall? 0 1     Risk for fall due to : No Fall Risks History of fall(s)     Follow up Falls prevention discussed;Education provided;Falls evaluation completed Falls evaluation completed;Education provided;Falls prevention discussed;Follow up appointment     Comment  Patient plans to call back for follow up appointment       MEDICARE RISK AT HOME: Medicare Risk at Home Any stairs in or around the home?: No If so, are there any without handrails?: No Home free of loose throw rugs in walkways, pet beds, electrical cords, etc?: Yes Adequate lighting in your home to reduce risk of falls?: Yes Life alert?: No Use of a cane, walker or w/c?: No Grab bars in the bathroom?: Yes Shower chair or bench in shower?: No Elevated toilet seat or a handicapped toilet?: Yes  TIMED UP AND GO:  Was the test performed?  No    Cognitive Function:    02/11/2018   11:08 AM  MMSE - Mini Mental State Exam  Orientation to time 5  Orientation to Place 5  Registration 3  Attention/ Calculation 5  Recall 3  Language- name 2 objects 2  Language- repeat 1  Language- follow 3 step command 3  Language- read & follow direction 1   Write a sentence 1  Copy design 0  Total score 29        11/03/2023    4:30 PM 10/30/2022    3:03 PM 10/26/2021    2:05 PM 09/20/2019    2:43 PM  6CIT Screen  What Year? 0 points 0 points 0 points 0 points  What month? 0 points 0 points 0 points 0 points  What time? 0 points 0 points 0 points 0 points  Count back from 20 0 points 0 points 0 points 0 points  Months in reverse 0 points 0 points 0 points 0 points  Repeat phrase 0 points 0 points 0 points 0 points  Total Score 0 points 0 points 0 points 0 points    Immunizations Immunization History  Administered Date(s) Administered   Influenza,inj,Quad PF,6+ Mos 08/29/2014, 01/02/2015, 09/12/2016   Influenza-Unspecified 12/20/2015   Pfizer(Comirnaty)Fall Seasonal Vaccine 12 years and older 07/26/2022   Tdap 09/24/2008, 10/22/2012   Zoster Recombinant(Shingrix) 11/07/2021, 01/14/2022    TDAP status: Due, Education has been provided regarding the importance of this vaccine. Advised may receive this vaccine at local pharmacy or Health Dept. Aware to provide a copy of the vaccination record if  obtained from local pharmacy or Health Dept. Verbalized acceptance and understanding.  Flu Vaccine status: Due, Education has been provided regarding the importance of this vaccine. Advised may receive this vaccine at local pharmacy or Health Dept. Aware to provide a copy of the vaccination record if obtained from local pharmacy or Health Dept. Verbalized acceptance and understanding.  Pneumococcal vaccine status: Up to date  Covid-19 vaccine status: Completed vaccines  Qualifies for Shingles Vaccine? Yes   Zostavax completed No   Shingrix Completed?: Yes  Screening Tests Health Maintenance  Topic Date Due   OPHTHALMOLOGY EXAM  06/11/2015   HEMOGLOBIN A1C  10/25/2022   Diabetic kidney evaluation - eGFR measurement  04/26/2023   Diabetic kidney evaluation - Urine ACR  04/26/2023   FOOT EXAM  04/26/2023   COVID-19 Vaccine (2 - 2024-25  season) 06/29/2023   INFLUENZA VACCINE  01/26/2024 (Originally 05/29/2023)   MAMMOGRAM  01/15/2024   Medicare Annual Wellness (AWV)  11/02/2024   Colonoscopy  12/21/2024   Hepatitis C Screening  Completed   HIV Screening  Completed   Zoster Vaccines- Shingrix  Completed   HPV VACCINES  Aged Out   DTaP/Tdap/Td  Discontinued    Health Maintenance  Health Maintenance Due  Topic Date Due   OPHTHALMOLOGY EXAM  06/11/2015   HEMOGLOBIN A1C  10/25/2022   Diabetic kidney evaluation - eGFR measurement  04/26/2023   Diabetic kidney evaluation - Urine ACR  04/26/2023   FOOT EXAM  04/26/2023   COVID-19 Vaccine (2 - 2024-25 season) 06/29/2023    Colorectal cancer screening: Type of screening: Colonoscopy. Completed 12/22/19. Repeat every 5 years  Mammogram status: Completed 01/15/23. Repeat every year  Lung Cancer Screening: (Low Dose CT Chest recommended if Age 47-80 years, 20 pack-year currently smoking OR have quit w/in 15years.) does not qualify.   Lung Cancer Screening Referral: n/a  Additional Screening:  Hepatitis C Screening: does qualify; Completed 5/12/2  Vision Screening: Recommended annual ophthalmology exams for early detection of glaucoma and other disorders of the eye. Is the patient up to date with their annual eye exam?  Yes  Who is the provider or what is the name of the office in which the patient attends annual eye exams? [facility] ([employer])  If pt is not established with a provider, would they like to be referred to a provider to establish care? No .   Dental Screening: Recommended annual dental exams for proper oral hygiene  Diabetic Foot Exam: Diabetic Foot Exam: Overdue, Pt has been advised about the importance in completing this exam. Pt is scheduled for diabetic foot exam on at next office visit .  Community Resource Referral / Chronic Care Management: CRR required this visit?  No   CCM required this visit?  No     Plan:     I have  personally reviewed and noted the following in the patient's chart:   Medical and social history Use of alcohol, tobacco or illicit drugs  Current medications and supplements including opioid prescriptions. Patient is not currently taking opioid prescriptions. Functional ability and status Nutritional status Physical activity Advanced directives List of other physicians Hospitalizations, surgeries, and ER visits in previous 12 months Vitals Screenings to include cognitive, depression, and falls Referrals and appointments  In addition, I have reviewed and discussed with patient certain preventive protocols, quality metrics, and best practice recommendations. A written personalized care plan for preventive services as well as general preventive health recommendations were provided to patient.  Lavelle Pfeiffer [place], [place]   05/30/7973   After Visit Summary: (MyChart) Due to this being a telephonic visit, the after visit summary with patients personalized plan was offered to patient via MyChart   Nurse Notes: See telephone call with patient concerns

## 2024-03-18 DIAGNOSIS — H938X2 Other specified disorders of left ear: Secondary | ICD-10-CM | POA: Diagnosis not present

## 2024-03-18 DIAGNOSIS — R053 Chronic cough: Secondary | ICD-10-CM | POA: Diagnosis not present

## 2024-03-18 DIAGNOSIS — H9201 Otalgia, right ear: Secondary | ICD-10-CM | POA: Diagnosis not present

## 2024-03-18 DIAGNOSIS — H9193 Unspecified hearing loss, bilateral: Secondary | ICD-10-CM | POA: Diagnosis not present

## 2024-03-18 DIAGNOSIS — J309 Allergic rhinitis, unspecified: Secondary | ICD-10-CM | POA: Diagnosis not present

## 2024-03-19 DIAGNOSIS — H903 Sensorineural hearing loss, bilateral: Secondary | ICD-10-CM | POA: Diagnosis not present

## 2024-06-13 ENCOUNTER — Ambulatory Visit (HOSPITAL_BASED_OUTPATIENT_CLINIC_OR_DEPARTMENT_OTHER)

## 2024-06-15 ENCOUNTER — Encounter: Payer: Self-pay | Admitting: Family

## 2024-06-15 ENCOUNTER — Ambulatory Visit: Admitting: Family

## 2024-06-15 ENCOUNTER — Other Ambulatory Visit (HOSPITAL_COMMUNITY)
Admission: RE | Admit: 2024-06-15 | Discharge: 2024-06-15 | Disposition: A | Discharge status: Home / Self Care | Source: Ambulatory Visit | Attending: Family | Admitting: Family

## 2024-06-15 VITALS — BP 118/80 | HR 74 | Temp 98.0°F | Ht 66.0 in | Wt 190.8 lb

## 2024-06-15 DIAGNOSIS — E782 Mixed hyperlipidemia: Secondary | ICD-10-CM | POA: Diagnosis not present

## 2024-06-15 DIAGNOSIS — R7303 Prediabetes: Secondary | ICD-10-CM

## 2024-06-15 DIAGNOSIS — E559 Vitamin D deficiency, unspecified: Secondary | ICD-10-CM | POA: Diagnosis not present

## 2024-06-15 DIAGNOSIS — Z0000 Encounter for general adult medical examination without abnormal findings: Secondary | ICD-10-CM | POA: Diagnosis not present

## 2024-06-15 DIAGNOSIS — F339 Major depressive disorder, recurrent, unspecified: Secondary | ICD-10-CM | POA: Diagnosis not present

## 2024-06-15 DIAGNOSIS — G4733 Obstructive sleep apnea (adult) (pediatric): Secondary | ICD-10-CM | POA: Diagnosis not present

## 2024-06-15 DIAGNOSIS — Z1151 Encounter for screening for human papillomavirus (HPV): Secondary | ICD-10-CM | POA: Insufficient documentation

## 2024-06-15 DIAGNOSIS — Z113 Encounter for screening for infections with a predominantly sexual mode of transmission: Secondary | ICD-10-CM | POA: Insufficient documentation

## 2024-06-15 DIAGNOSIS — Z0001 Encounter for general adult medical examination with abnormal findings: Secondary | ICD-10-CM

## 2024-06-15 DIAGNOSIS — E669 Obesity, unspecified: Secondary | ICD-10-CM

## 2024-06-15 DIAGNOSIS — Z01419 Encounter for gynecological examination (general) (routine) without abnormal findings: Secondary | ICD-10-CM

## 2024-06-15 DIAGNOSIS — F411 Generalized anxiety disorder: Secondary | ICD-10-CM | POA: Diagnosis not present

## 2024-06-15 DIAGNOSIS — F251 Schizoaffective disorder, depressive type: Secondary | ICD-10-CM

## 2024-06-15 LAB — LIPID PANEL

## 2024-06-15 LAB — BAYER DCA HB A1C WAIVED: HB A1C (BAYER DCA - WAIVED): 5.6 % (ref 4.8–5.6)

## 2024-06-15 NOTE — Patient Instructions (Signed)

## 2024-06-15 NOTE — Addendum Note (Signed)
 Addended by: MICHELINE ROSINA FALCON on: 06/15/2024 03:02 PM   Modules accepted: Orders

## 2024-06-15 NOTE — Progress Notes (Signed)
 Subjective:    Patient ID: Grace Barajas, female    DOB: 07/29/1966, 58 y.o.   MRN: [id number]  Chief Complaint  Patient presents with   Annual Exam   Pt presents to the office today for CPE with pap.   PT is followed by [facility] every two to three weeks for schizoaffective disorder and depression.   She has OSA and uses CPAP nightly.  Hyperlipidemia This is a chronic problem. The current episode started more than 1 year ago. The problem is uncontrolled. Exacerbating diseases include obesity. Current antihyperlipidemic treatment includes statins. The current treatment provides moderate improvement of lipids. Risk factors for coronary artery disease include a sedentary lifestyle, post-menopausal and dyslipidemia.  Diabetes Diabetes type: prediabetes. Pertinent negatives for diabetes include no blurred vision and no foot paresthesias. Risk factors for coronary artery disease include dyslipidemia, diabetes mellitus, sedentary lifestyle and post-menopausal. She is following a generally unhealthy diet. (Does not check glucose at home)      Review of Systems  Eyes:  Negative for blurred vision.  All other systems reviewed and are negative.   Social History   Socioeconomic History   Marital status: Single    Spouse name: Not on file   Number of children: 3   Years of education: Not on file   Highest education level: Bachelor's degree (e.g., BA, AB, BS)  Occupational History   Occupation: disabled   Occupation: Consulting civil engineer  Tobacco Use   Smoking status: Never   Smokeless tobacco: Never  Vaping Use   Vaping status: Never Used  Substance and Sexual Activity   Alcohol use: Not Currently    Comment: rare   Drug use: No   Sexual activity: Not on file  Other Topics Concern   Not on file  Social History Narrative   Not on file   Social Drivers of Health   Financial Resource Strain: Low Risk  (11/03/2023)   Overall Financial Resource Strain (CARDIA)    Difficulty of  Paying Living Expenses: Not hard at all  Food Insecurity: No Food Insecurity (11/03/2023)   Hunger Vital Sign    Worried About Running Out of Food in the Last Year: Never true    Ran Out of Food in the Last Year: Never true  Transportation Needs: No Transportation Needs (11/03/2023)   PRAPARE - Administrator, Civil Service (Medical): No    Lack of Transportation (Non-Medical): No  Physical Activity: Inactive (11/03/2023)   Exercise Vital Sign    Days of Exercise per Week: 0 days    Minutes of Exercise per Session: 0 min  Stress: No Stress Concern Present (11/03/2023)   [employer] of Occupational Health - Occupational Stress Questionnaire    Feeling of Stress : Only a little  Social Connections: Moderately Isolated (11/03/2023)   Social Connection and Isolation Panel    Frequency of Communication with Friends and Family: More than three times a week    Frequency of Social Gatherings with Friends and Family: Three times a week    Attends Religious Services: 1 to 4 times per year    Active Member of Clubs or Organizations: No    Attends Banker Meetings: Never    Marital Status: Never married   Family History  Problem Relation Age of Onset   Asthma Son    Asthma Brother    Obesity Father    Diabetes Father    Heart disease Father    Hyperlipidemia Father  Diabetes Maternal Grandmother    Diabetes Maternal Aunt    Diabetes Maternal Uncle    Colon polyps Maternal Uncle    Thyroid  disease Neg Hx    Breast cancer Neg Hx    Colon cancer Neg Hx    Esophageal cancer Neg Hx    Rectal cancer Neg Hx    Stomach cancer Neg Hx         Objective:   Physical Exam Vitals reviewed.  Constitutional:      General: She is not in acute distress.    Appearance: She is well-developed.  HENT:     Head: Normocephalic and atraumatic.     Right Ear: Tympanic membrane normal.     Left Ear: Tympanic membrane normal.  Eyes:     Pupils: Pupils are equal, round, and  reactive to light.  Neck:     Thyroid : No thyromegaly.  Cardiovascular:     Rate and Rhythm: Normal rate and regular rhythm.     Heart sounds: Normal heart sounds. No murmur heard. Pulmonary:     Effort: Pulmonary effort is normal. No respiratory distress.     Breath sounds: Normal breath sounds. No wheezing.  Chest:  Breasts:    Right: No swelling, bleeding, inverted nipple, mass, nipple discharge, skin change or tenderness.     Left: No swelling, bleeding, inverted nipple, mass, nipple discharge, skin change or tenderness.  Abdominal:     General: Bowel sounds are normal. There is no distension.     Palpations: Abdomen is soft.     Tenderness: There is no abdominal tenderness.  Genitourinary:    Comments: Bimanual exam- no adnexal masses or tenderness, ovaries nonpalpable   No cervix present,  No discharge  Musculoskeletal:        General: No tenderness. Normal range of motion.     Cervical back: Normal range of motion and neck supple.  Skin:    General: Skin is warm and dry.  Neurological:     Mental Status: She is alert and oriented to person, place, and time.     Cranial Nerves: No cranial nerve deficit.     Deep Tendon Reflexes: Reflexes are normal and symmetric.  Psychiatric:        Behavior: Behavior normal.        Thought Content: Thought content normal.        Judgment: Judgment normal.       BP 118/80   Pulse 74   Temp 98 F (36.7 C) (Temporal)   Ht 5' 6 (1.676 m)   Wt 190 lb 12.8 oz (86.5 kg)   LMP 01/11/2013   BMI 30.80 kg/m      Assessment & Plan:  Grace Barajas comes in today with chief complaint of Annual Exam   Diagnosis and orders addressed:  1. Annual physical exam (Primary) - Bayer DCA Hb A1c Waived - CMP14+EGFR - CBC with Differential/Platelet - Lipid panel - Microalbumin / creatinine urine ratio - TSH - Vitamin B12  2. Encounter for gynecological examination without abnormal finding - CMP14+EGFR - CBC with  Differential/Platelet  3. Episode of recurrent major depressive disorder, unspecified depression episode severity (HCC) - CMP14+EGFR - CBC with Differential/Platelet  4. GAD (generalized anxiety disorder) - CMP14+EGFR - CBC with Differential/Platelet  5. Obesity (BMI 30-39.9) - CMP14+EGFR - CBC with Differential/Platelet  6. Moderate mixed hyperlipidemia not requiring statin therapy - CMP14+EGFR - CBC with Differential/Platelet  7. Vitamin D  deficiency - CMP14+EGFR - CBC with Differential/Platelet - VITAMIN  D 25 Hydroxy (Vit-D Deficiency, Fractures)  8. OSA (obstructive sleep apnea) - CMP14+EGFR - CBC with Differential/Platelet  9. Prediabetes - Bayer DCA Hb A1c Waived - CMP14+EGFR - CBC with Differential/Platelet - Microalbumin / creatinine urine ratio - TSH - Vitamin B12  10. Schizoaffective disorder, depressive type (HCC)   Labs pending Continue current medications  Keep follow up with specialists  Health Maintenance reviewed Diet and exercise encouraged  Return in about 6 months (around 12/16/2024), or if symptoms worsen or fail to improve.    Bari Learn, FNP

## 2024-06-16 LAB — CBC WITH DIFFERENTIAL/PLATELET
Basophils Absolute: 0 x10E3/uL (ref 0.0–0.2)
Basos: 1 %
EOS (ABSOLUTE): 0.2 x10E3/uL (ref 0.0–0.4)
Eos: 4 %
Hematocrit: 36.8 % (ref 34.0–46.6)
Hemoglobin: 12.2 g/dL (ref 11.1–15.9)
Immature Grans (Abs): 0 x10E3/uL (ref 0.0–0.1)
Immature Granulocytes: 0 %
Lymphocytes Absolute: 2.3 x10E3/uL (ref 0.7–3.1)
Lymphs: 47 %
MCH: 29.7 pg (ref 26.6–33.0)
MCHC: 33.2 g/dL (ref 31.5–35.7)
MCV: 90 fL (ref 79–97)
Monocytes Absolute: 0.3 x10E3/uL (ref 0.1–0.9)
Monocytes: 7 %
Neutrophils Absolute: 2 x10E3/uL (ref 1.4–7.0)
Neutrophils: 41 %
Platelets: 394 x10E3/uL (ref 150–450)
RBC: 4.11 x10E6/uL (ref 3.77–5.28)
RDW: 13.1 % (ref 11.7–15.4)
WBC: 4.8 x10E3/uL (ref 3.4–10.8)

## 2024-06-16 LAB — CMP14+EGFR
ALT: 13 IU/L (ref 0–32)
AST: 9 IU/L (ref 0–40)
Albumin: 4.4 g/dL (ref 3.8–4.9)
Alkaline Phosphatase: 113 IU/L (ref 44–121)
BUN/Creatinine Ratio: 17 (ref 9–23)
BUN: 11 mg/dL (ref 6–24)
Bilirubin Total: 0.4 mg/dL (ref 0.0–1.2)
CO2: 24 mmol/L (ref 20–29)
Calcium: 10.1 mg/dL (ref 8.7–10.2)
Chloride: 101 mmol/L (ref 96–106)
Creatinine, Ser: 0.63 mg/dL (ref 0.57–1.00)
Globulin, Total: 2.8 g/dL (ref 1.5–4.5)
Glucose: 90 mg/dL (ref 70–99)
Potassium: 4.3 mmol/L (ref 3.5–5.2)
Sodium: 140 mmol/L (ref 134–144)
Total Protein: 7.2 g/dL (ref 6.0–8.5)
eGFR: 103 mL/min/1.73 (ref >59)

## 2024-06-16 LAB — LIPID PANEL
Cholesterol, Total: 205 mg/dL — AB (ref 100–199)
HDL: 51 mg/dL (ref >39)
LDL CALC COMMENT:: 4 ratio (ref 0.0–4.4)
LDL Chol Calc (NIH): 130 mg/dL — AB (ref 0–99)
Triglycerides: 135 mg/dL (ref 0–149)
VLDL Cholesterol Cal: 24 mg/dL (ref 5–40)

## 2024-06-16 LAB — VITAMIN D 25 HYDROXY (VIT D DEFICIENCY, FRACTURES): Vit D, 25-Hydroxy: 30.5 ng/mL (ref 30.0–100.0)

## 2024-06-16 LAB — VITAMIN B12: Vitamin B-12: 576 pg/mL (ref 232–1245)

## 2024-06-16 LAB — TSH: TSH: 0.893 u[IU]/mL (ref 0.450–4.500)

## 2024-06-17 ENCOUNTER — Ambulatory Visit: Payer: Self-pay | Admitting: Family

## 2024-06-17 DIAGNOSIS — R7303 Prediabetes: Secondary | ICD-10-CM

## 2024-06-17 LAB — MICROALBUMIN / CREATININE URINE RATIO
Creatinine, Urine: 99 mg/dL
Microalb/Creat Ratio: 6 mg/g{creat} (ref 0–29)
Microalbumin, Urine: 6.1 ug/mL

## 2024-06-17 LAB — CYTOLOGY - PAP
Chlamydia: NEGATIVE
Comment: NEGATIVE
Comment: NEGATIVE
Comment: NEGATIVE
Comment: NORMAL
Diagnosis: NEGATIVE
High risk HPV: NEGATIVE
Neisseria Gonorrhea: NEGATIVE
Trichomonas: NEGATIVE

## 2024-06-17 MED ORDER — ROSUVASTATIN CALCIUM 5 MG PO TABS: 5.0000 mg | ORAL_TABLET | Freq: Every day | ORAL | 3 refills | Status: AC

## 2024-06-24 MED ORDER — OZEMPIC (0.25 OR 0.5 MG/DOSE) 2 MG/3ML ~~LOC~~ SOPN: 0.2500 mg | PEN_INJECTOR | SUBCUTANEOUS | 2 refills | Status: DC

## 2024-06-29 ENCOUNTER — Encounter: Admitting: Family

## 2024-07-06 ENCOUNTER — Ambulatory Visit (HOSPITAL_BASED_OUTPATIENT_CLINIC_OR_DEPARTMENT_OTHER)

## 2024-07-11 ENCOUNTER — Ambulatory Visit (HOSPITAL_BASED_OUTPATIENT_CLINIC_OR_DEPARTMENT_OTHER)

## 2024-07-15 ENCOUNTER — Ambulatory Visit (HOSPITAL_BASED_OUTPATIENT_CLINIC_OR_DEPARTMENT_OTHER)
Admission: RE | Admit: 2024-07-15 | Discharge: 2024-07-15 | Disposition: A | Discharge status: Home / Self Care | Source: Ambulatory Visit | Attending: Pulmonary Disease | Admitting: Pulmonary Disease

## 2024-07-15 DIAGNOSIS — D3502 Benign neoplasm of left adrenal gland: Secondary | ICD-10-CM | POA: Diagnosis not present

## 2024-07-15 DIAGNOSIS — D367 Benign neoplasm of other specified sites: Secondary | ICD-10-CM | POA: Diagnosis not present

## 2024-07-15 DIAGNOSIS — R911 Solitary pulmonary nodule: Secondary | ICD-10-CM | POA: Insufficient documentation

## 2024-07-15 DIAGNOSIS — E042 Nontoxic multinodular goiter: Secondary | ICD-10-CM | POA: Diagnosis not present

## 2024-07-20 ENCOUNTER — Telehealth: Payer: Self-pay

## 2024-07-20 NOTE — Telephone Encounter (Signed)
 Spoke with pt to let her know there is Barajas shortage on radiologist and some scans are taking up to two weeks to be read. We will reach out once it is received and reviewed by provider. She verbalized understanding NFN   Copied from CRM [id number]. Topic: Clinical - Lab/Test Results >> Jul 20, 2024  1:30 PM Grace Barajas wrote: Reason for CRM: Patient calling to speak with Dr.Alvas's nurse in regard to her CT results not being back within 2 days - states this is the time frame she was given due to her having Barajas lung nodule.   Callback number: [phone]

## 2024-07-22 ENCOUNTER — Ambulatory Visit: Payer: Self-pay | Admitting: Pulmonary Disease

## 2024-08-11 ENCOUNTER — Other Ambulatory Visit: Payer: Self-pay

## 2024-08-11 DIAGNOSIS — G4733 Obstructive sleep apnea (adult) (pediatric): Secondary | ICD-10-CM

## 2024-08-19 ENCOUNTER — Encounter (HOSPITAL_BASED_OUTPATIENT_CLINIC_OR_DEPARTMENT_OTHER): Payer: Self-pay | Admitting: Pulmonary Disease

## 2024-08-19 ENCOUNTER — Ambulatory Visit (HOSPITAL_BASED_OUTPATIENT_CLINIC_OR_DEPARTMENT_OTHER): Admitting: Pulmonary Disease

## 2024-08-19 ENCOUNTER — Ambulatory Visit (HOSPITAL_BASED_OUTPATIENT_CLINIC_OR_DEPARTMENT_OTHER)

## 2024-08-19 VITALS — BP 115/72 | HR 74 | Ht 66.0 in | Wt 192.0 lb

## 2024-08-19 DIAGNOSIS — E669 Obesity, unspecified: Secondary | ICD-10-CM

## 2024-08-19 DIAGNOSIS — R911 Solitary pulmonary nodule: Secondary | ICD-10-CM

## 2024-08-19 DIAGNOSIS — G4733 Obstructive sleep apnea (adult) (pediatric): Secondary | ICD-10-CM | POA: Diagnosis not present

## 2024-08-19 DIAGNOSIS — E042 Nontoxic multinodular goiter: Secondary | ICD-10-CM | POA: Diagnosis not present

## 2024-08-19 LAB — PULMONARY FUNCTION TEST
DL/VA % pred: 120 %
DL/VA: 5.01 ml/min/mmHg/L
DLCO unc % pred: 105 %
DLCO unc: 23 ml/min/mmHg
FEF 25-75 Post: 2.19 L/s
FEF 25-75 Pre: 2.03 L/s
FEF2575-%Change-Post: 7 %
FEF2575-%Pred-Post: 87 %
FEF2575-%Pred-Pre: 81 %
FEV1-%Change-Post: -2 %
FEV1-%Pred-Post: 88 %
FEV1-%Pred-Pre: 91 %
FEV1-Post: 2.46 L
FEV1-Pre: 2.53 L
FEV1FVC-%Change-Post: -2 %
FEV1FVC-%Pred-Pre: 103 %
FEV6-%Change-Post: 0 %
FEV6-%Pred-Post: 89 %
FEV6-%Pred-Pre: 90 %
FEV6-Post: 3.11 L
FEV6-Pre: 3.13 L
FEV6FVC-%Change-Post: 0 %
FEV6FVC-%Pred-Post: 103 %
FEV6FVC-%Pred-Pre: 103 %
FVC-%Change-Post: 0 %
FVC-%Pred-Post: 86 %
FVC-%Pred-Pre: 87 %
FVC-Post: 3.11 L
FVC-Pre: 3.14 L
Post FEV1/FVC ratio: 79 %
Post FEV6/FVC ratio: 100 %
Pre FEV1/FVC ratio: 81 %
Pre FEV6/FVC Ratio: 100 %
RV % pred: 113 %
RV: 2.35 L
TLC % pred: 101 %
TLC: 5.43 L

## 2024-08-19 NOTE — Patient Instructions (Signed)
 Full PFT performed today.

## 2024-08-19 NOTE — Progress Notes (Signed)
 Full PFT performed today.

## 2024-08-19 NOTE — Progress Notes (Signed)
 Subjective:    Patient ID: Grace Barajas, female    DOB: [birth year], 58 y.o.   MRN: [id number]       58 yo never smoker for follow-up of moderate OSA and right lower lobe nodule    10mm RLL nodule Noted incidentally on CT scan when she had community-acquired pneumonia in 2018 This has gradually grown in size dotatate PET scan was turned down by insurance 2021 in spite of peer-to-peer review, TCTS recommended resection   PMH -multinodular goiter Schizoaffective disorder  Discussed the use of AI scribe software for clinical note transcription with the patient, who gave verbal consent to proceed.  History of Present Illness Grace Barajas is a 58 year old female with obstructive sleep apnea and thyroid  nodules who presents for follow-up.  She uses her CPAP machine regularly, which improves her sleep quality. Her weight is currently 192 pounds, with fluctuations noted. She is on Ozempic  0.25 mg but experiences gastrointestinal side effects. A recent breathing test shows good lung function, with results around 85-90%. She experiences heavy breathing, which she attributes to her weight and age.     Significant tests/ events reviewed   06/2024 CT chest wo con >> stable RLL nodule CT chest without con 05/2023 stable nodule 19 x 14 mm  CT chest without con >> right lower lobe nodule 1.9 x 1.3 cm, enlarged compared to 2018 CT chest wo con 03/2021 >> Minimal progression of 1.7 x 1.3 cm (previously 1.6 x 1.2 cm) retro hilar RLL nodule   PET 09/2019 1.7 x 1.2 cm , negative     US  thyroid  09/2018 >> multinodular goiter. No definitive worrisomenew or enlarging thyroid  nodules.  Previously biopsied nodule within the right lobe of the thyroid  (labeled 2), is grossly unchanged compared to the 11/2016 examination Left mid nodule >> 1 yr FU advised     10/2016-CT angiogram showed  a right lower lobe 10 mm nodule. There were 2 thyroid  nodules 10 mm calcified left lobe and 8 mm noncalcified  left lobe and possible adrenal nodule was noted measuring 1.1 cm in the left.   NPSG 06/2014:  AHI 15/hr, desat 89% 06/2015 AHI 16   Spirometry 08/2016 was a poor effort but showed normal lung function without airway obstruction  Review of Systems  neg for any significant sore throat, dysphagia, itching, sneezing, nasal congestion or excess/ purulent secretions, fever, chills, sweats, unintended wt loss, pleuritic or exertional cp, hempoptysis, orthopnea pnd or change in chronic leg swelling. Also denies presyncope, palpitations, heartburn, abdominal pain, nausea, vomiting, diarrhea or change in bowel or urinary habits, dysuria,hematuria, rash, arthralgias, visual complaints, headache, numbness weakness or ataxia.      Objective:   Physical Exam  Gen. Pleasant, well-nourished, in no distress ENT - no thrush, no pallor/icterus,no post nasal drip Neck: No JVD, no thyromegaly, no carotid bruits Lungs: no use of accessory muscles, no dullness to percussion, clear without rales or rhonchi  Cardiovascular: Rhythm regular, heart sounds  normal, no murmurs or gallops, no peripheral edema Musculoskeletal: No deformities, no cyanosis or clubbing        Assessment & Plan:   Assessment and Plan Assessment & Plan Obstructive sleep apnea Obstructive sleep apnea is well-managed with CPAP therapy. She reports adherence and symptom improvement, indicating effective management. Continued weight management may further improve symptoms and potentially reduce the need for CPAP. - Continue CPAP therapy - Monitor weight and encourage weight loss to potentially reduce the need for CPAP  Obesity She  is currently on Ozempic  for weight management but reports dissatisfaction due to gastrointestinal side effects. She expresses interest in switching to Mounjaro or Wegovy , which may have different receptor activity and potentially fewer side effects. - Discuss potential switch to Mounjaro or Wegovy  for weight  management - Continue monitoring weight and encourage lifestyle modifications   Lung nodule -s table , likely carcinoid FOllow in 1 year   Bilateral thyroid  nodules Bilateral thyroid  nodules are consistent with previous ultrasound findings from 2023, with left-sided nodules measuring 11 mm. The nodules have shown no significant change over the past few years, indicating stability. Previous biopsy of the right side was benign. - Continue annual monitoring of thyroid  nodules with ultrasound - Plan for next ultrasound in September 2026

## 2024-08-19 NOTE — Patient Instructions (Signed)
 X Ct chest wo con in sep 2026

## 2024-10-14 ENCOUNTER — Telehealth: Admitting: Family

## 2024-10-14 ENCOUNTER — Other Ambulatory Visit (HOSPITAL_COMMUNITY): Payer: Self-pay

## 2024-10-14 ENCOUNTER — Encounter: Payer: Self-pay | Admitting: Family

## 2024-10-14 ENCOUNTER — Telehealth: Payer: Self-pay

## 2024-10-14 VITALS — Wt 198.0 lb

## 2024-10-14 DIAGNOSIS — E669 Obesity, unspecified: Secondary | ICD-10-CM

## 2024-10-14 DIAGNOSIS — Z713 Dietary counseling and surveillance: Secondary | ICD-10-CM

## 2024-10-14 DIAGNOSIS — E782 Mixed hyperlipidemia: Secondary | ICD-10-CM | POA: Diagnosis not present

## 2024-10-14 DIAGNOSIS — R7303 Prediabetes: Secondary | ICD-10-CM | POA: Insufficient documentation

## 2024-10-14 NOTE — Patient Instructions (Signed)

## 2024-10-14 NOTE — Progress Notes (Signed)
 Virtual Visit Consent   Augustine PARAS Thornell, you are scheduled for a virtual visit with a [facility] provider today. Just as with appointments in the office, your consent must be obtained to participate. Your consent will be active for this visit and any virtual visit you may have with one of our providers in the next 365 days. If you have a MyChart account, a copy of this consent can be sent to you electronically.  As this is a virtual visit, video technology does not allow for your provider to perform a traditional examination. This may limit your provider's ability to fully assess your condition. If your provider identifies any concerns that need to be evaluated in person or the need to arrange testing (such as labs, EKG, etc.), we will make arrangements to do so. Although advances in technology are sophisticated, we cannot ensure that it will always work on either your end or our end. If the connection with a video visit is poor, the visit may have to be switched to a telephone visit. With either a video or telephone visit, we are not always able to ensure that we have a secure connection.  By engaging in this virtual visit, you consent to the provision of healthcare and authorize for your insurance to be billed (if applicable) for the services provided during this visit. Depending on your insurance coverage, you may receive a charge related to this service.  I need to obtain your verbal consent now. Are you willing to proceed with your visit today? Grace Barajas has provided verbal consent on 10/14/2024 for a virtual visit (video or telephone). Bari Learn, FNP  Date: 10/14/2024 2:23 PM   Virtual Visit via Video Note   I, Bari Learn, connected with  Grace Barajas  ([id number], 11/01/1965) on 10/14/2024 at  2:25 PM EST by a video-enabled telemedicine application and verified that I am speaking with the correct person using two identifiers.  Location: Patient: Virtual Visit  Location Patient: Home Provider: Virtual Visit Location Provider: Home Office   I discussed the limitations of evaluation and management by telemedicine and the availability of in person appointments. The patient expressed understanding and agreed to proceed.    History of Present Illness: Grace Barajas is a 58 y.o. who identifies as a female who was assigned female at birth, and is being seen today for weight loss medication. She is currently taking Wegovy  0.5 mg. Her starting weight was 190 lb. She is prediabetic and has hyperlipidemia that is stable.       10/14/2024    2:16 PM 08/19/2024    1:23 PM 06/15/2024    1:57 PM  Last 3 Weights  Weight (lbs) 198 lb 192 lb 190 lb 12.8 oz  Weight (kg) 89.812 kg 87.091 kg 86.546 kg      HPI: HPI  Problems:  Patient Active Problem List   Diagnosis Date Noted   Prediabetes 10/14/2024   Abnormal findings on diagnostic imaging of lung 06/30/2019   Vitamin D  deficiency 02/28/2017   Hypertrophy of inferior nasal turbinate 01/22/2017   Deviated nasal septum 01/22/2017   Multinodular goiter 12/06/2016   Solitary lung nodule 11/18/2016   Obesity (BMI 30-39.9) 01/23/2016   Hyperlipidemia 01/23/2016   Allergic rhinitis 09/04/2015   OSA (obstructive sleep apnea) 07/29/2014   Bilateral low back pain 05/27/2014   Depression 05/23/2014   GAD (generalized anxiety disorder) 05/23/2014   Dyspnea 12/30/2011   Schizoaffective disorder, depressive type (HCC) 08/08/2011    Allergies:  Allergies[1] Medications: Current Medications[2]  Observations/Objective: Patient is well-developed, well-nourished in no acute distress.  Resting comfortably  at home.  Head is normocephalic, atraumatic.  No labored breathing.  Speech is clear and coherent with logical content.  Patient is alert and oriented at baseline.    Assessment and Plan: 1. Obesity (BMI 30-39.9) (Primary) - Semaglutide , 1 MG/DOSE, 4 MG/3ML SOPN; Inject 1 mg as directed once a week.   Dispense: 3 mL; Refill: 2  2. Weight loss counseling, encounter for - Semaglutide , 1 MG/DOSE, 4 MG/3ML SOPN; Inject 1 mg as directed once a week.  Dispense: 3 mL; Refill: 2  3. Moderate mixed hyperlipidemia not requiring statin therapy - Semaglutide , 1 MG/DOSE, 4 MG/3ML SOPN; Inject 1 mg as directed once a week.  Dispense: 3 mL; Refill: 2  4. Prediabetes - Semaglutide , 1 MG/DOSE, 4 MG/3ML SOPN; Inject 1 mg as directed once a week.  Dispense: 3 mL; Refill: 2  Will increase Ozempic  to 1 mg from 0.5 mg Encouraged healthy diet and exercise Follow up in 2 months   Follow Up Instructions: I discussed the assessment and treatment plan with the patient. The patient was provided an opportunity to ask questions and all were answered. The patient agreed with the plan and demonstrated an understanding of the instructions.  A copy of instructions were sent to the patient via MyChart unless otherwise noted below.     The patient was advised to call back or seek an in-person evaluation if the symptoms worsen or if the condition fails to improve as anticipated.    Bari Learn, FNP    [1]  Allergies Allergen Reactions   Morphine And Codeine     Felt like an addict needing a fix.  [2]  Current Outpatient Medications:    buPROPion (WELLBUTRIN XL) 300 MG 24 hr tablet, Take 300 mg by mouth daily., Disp: , Rfl:    cetirizine (ZYRTEC) 10 MG tablet, Take 10 mg by mouth daily., Disp: , Rfl:    FLUoxetine  (PROZAC ) 40 MG capsule, Take 80 mg by mouth daily., Disp: , Rfl:    guanFACINE (INTUNIV) 2 MG TB24 ER tablet, Take 2 mg by mouth daily., Disp: , Rfl:    lisdexamfetamine (VYVANSE) 40 MG capsule, Take 40 mg by mouth., Disp: , Rfl:    loxapine (LOXITANE) 10 MG capsule, Take 10 mg by mouth at bedtime., Disp: , Rfl:    rosuvastatin  (CRESTOR ) 5 MG tablet, Take 1 tablet (5 mg total) by mouth daily., Disp: 90 tablet, Rfl: 3   Semaglutide , 1 MG/DOSE, 4 MG/3ML SOPN, Inject 1 mg as directed once a week.,  Disp: 3 mL, Rfl: 2

## 2024-10-14 NOTE — Telephone Encounter (Signed)
 Patient called inquiring assistance for Ozempic .   Patient currently not eligible for Novo Nordisk PAP.   Patient also approved for LIS/Extra Help.   Current copays are no more than $12 foOzempic .   Test claim shows Ozempic  was filled 10/13/24. Patient understands she can ask the pharmacy to fill her meds for up to a 3 month supply. Says she has an appt today with Dr. Lavell and will talk to her about increasing her dose.

## 2024-12-14 ENCOUNTER — Ambulatory Visit: Admitting: Family

## 2024-12-14 ENCOUNTER — Ambulatory Visit
